# Patient Record
Sex: Female | Born: 1965 | Race: White | Hispanic: No | Marital: Married | Smoking: Current every day smoker
Health system: Southern US, Community
[De-identification: ages and names within clinical notes are randomized; demographics above are authoritative.]

## PROBLEM LIST (undated history)

## (undated) DIAGNOSIS — J40 Bronchitis, not specified as acute or chronic: Secondary | ICD-10-CM

## (undated) DIAGNOSIS — G4700 Insomnia, unspecified: Secondary | ICD-10-CM

## (undated) DIAGNOSIS — E785 Hyperlipidemia, unspecified: Secondary | ICD-10-CM

## (undated) DIAGNOSIS — N9489 Other specified conditions associated with female genital organs and menstrual cycle: Secondary | ICD-10-CM

## (undated) DIAGNOSIS — Z87891 Personal history of nicotine dependence: Secondary | ICD-10-CM

## (undated) DIAGNOSIS — K429 Umbilical hernia without obstruction or gangrene: Secondary | ICD-10-CM

## (undated) DIAGNOSIS — I2510 Atherosclerotic heart disease of native coronary artery without angina pectoris: Secondary | ICD-10-CM

## (undated) DIAGNOSIS — K219 Gastro-esophageal reflux disease without esophagitis: Secondary | ICD-10-CM

## (undated) DIAGNOSIS — C801 Malignant (primary) neoplasm, unspecified: Secondary | ICD-10-CM

## (undated) HISTORY — DX: Insomnia, unspecified: G47.00

## (undated) HISTORY — DX: Umbilical hernia without obstruction or gangrene: K42.9

## (undated) HISTORY — DX: Personal history of nicotine dependence: Z87.891

## (undated) HISTORY — PX: VAGINAL HYSTERECTOMY: SUR661

## (undated) HISTORY — PX: OOPHORECTOMY: SHX86

## (undated) HISTORY — DX: Hyperlipidemia, unspecified: E78.5

## (undated) HISTORY — PX: APPENDECTOMY: SHX54

## (undated) HISTORY — DX: Atherosclerotic heart disease of native coronary artery without angina pectoris: I25.10

## (undated) HISTORY — DX: Bronchitis, not specified as acute or chronic: J40

---

## 1997-04-21 ENCOUNTER — Emergency Department (HOSPITAL_COMMUNITY): Admission: EM | Admit: 1997-04-21 | Discharge: 1997-04-21 | Payer: Self-pay | Admitting: Emergency Medicine

## 1997-10-24 ENCOUNTER — Emergency Department (HOSPITAL_COMMUNITY): Admission: EM | Admit: 1997-10-24 | Discharge: 1997-10-24 | Payer: Self-pay | Admitting: Emergency Medicine

## 2000-01-16 ENCOUNTER — Encounter: Admission: RE | Admit: 2000-01-16 | Discharge: 2000-01-16 | Payer: Self-pay | Admitting: *Deleted

## 2000-01-16 ENCOUNTER — Encounter: Payer: Self-pay | Admitting: *Deleted

## 2000-05-27 ENCOUNTER — Ambulatory Visit (HOSPITAL_COMMUNITY): Admission: RE | Admit: 2000-05-27 | Discharge: 2000-05-27 | Payer: Self-pay | Admitting: Internal Medicine

## 2000-05-27 ENCOUNTER — Encounter: Payer: Self-pay | Admitting: Internal Medicine

## 2000-07-01 ENCOUNTER — Encounter: Payer: Self-pay | Admitting: Obstetrics and Gynecology

## 2000-07-01 ENCOUNTER — Encounter: Admission: RE | Admit: 2000-07-01 | Discharge: 2000-07-01 | Payer: Self-pay | Admitting: *Deleted

## 2000-07-02 ENCOUNTER — Encounter: Payer: Self-pay | Admitting: Obstetrics and Gynecology

## 2000-07-02 ENCOUNTER — Ambulatory Visit (HOSPITAL_COMMUNITY): Admission: RE | Admit: 2000-07-02 | Discharge: 2000-07-02 | Payer: Self-pay | Admitting: Obstetrics and Gynecology

## 2000-07-25 ENCOUNTER — Ambulatory Visit (HOSPITAL_COMMUNITY): Admission: RE | Admit: 2000-07-25 | Discharge: 2000-07-25 | Payer: Self-pay | Admitting: Obstetrics and Gynecology

## 2000-07-25 ENCOUNTER — Encounter: Payer: Self-pay | Admitting: Obstetrics and Gynecology

## 2000-08-29 ENCOUNTER — Ambulatory Visit (HOSPITAL_COMMUNITY): Admission: RE | Admit: 2000-08-29 | Discharge: 2000-08-29 | Payer: Self-pay | Admitting: Obstetrics and Gynecology

## 2000-08-29 ENCOUNTER — Encounter: Payer: Self-pay | Admitting: Obstetrics and Gynecology

## 2000-09-05 ENCOUNTER — Encounter (INDEPENDENT_AMBULATORY_CARE_PROVIDER_SITE_OTHER): Payer: Self-pay | Admitting: Specialist

## 2000-09-05 ENCOUNTER — Observation Stay (HOSPITAL_COMMUNITY): Admission: RE | Admit: 2000-09-05 | Discharge: 2000-09-06 | Payer: Self-pay | Admitting: Obstetrics and Gynecology

## 2003-08-03 ENCOUNTER — Ambulatory Visit (HOSPITAL_COMMUNITY): Admission: RE | Admit: 2003-08-03 | Discharge: 2003-08-03 | Payer: Self-pay | Admitting: Internal Medicine

## 2004-08-02 ENCOUNTER — Ambulatory Visit (HOSPITAL_COMMUNITY): Admission: RE | Admit: 2004-08-02 | Discharge: 2004-08-02 | Payer: Self-pay | Admitting: Internal Medicine

## 2004-10-02 ENCOUNTER — Encounter: Admission: RE | Admit: 2004-10-02 | Discharge: 2004-10-02 | Payer: Self-pay | Admitting: Internal Medicine

## 2005-02-23 ENCOUNTER — Emergency Department (HOSPITAL_COMMUNITY): Admission: EM | Admit: 2005-02-23 | Discharge: 2005-02-23 | Payer: Self-pay | Admitting: Emergency Medicine

## 2005-06-18 ENCOUNTER — Ambulatory Visit (HOSPITAL_COMMUNITY): Admission: RE | Admit: 2005-06-18 | Discharge: 2005-06-18 | Payer: Self-pay | Admitting: Family Medicine

## 2006-07-01 ENCOUNTER — Emergency Department (HOSPITAL_COMMUNITY): Admission: EM | Admit: 2006-07-01 | Discharge: 2006-07-01 | Payer: Self-pay | Admitting: Emergency Medicine

## 2007-06-02 ENCOUNTER — Ambulatory Visit: Payer: Self-pay | Admitting: Family Medicine

## 2007-06-03 ENCOUNTER — Encounter: Admission: RE | Admit: 2007-06-03 | Discharge: 2007-06-03 | Payer: Self-pay | Admitting: Family Medicine

## 2008-04-25 ENCOUNTER — Ambulatory Visit: Payer: Self-pay | Admitting: Family Medicine

## 2008-04-28 ENCOUNTER — Ambulatory Visit: Payer: Self-pay | Admitting: Family Medicine

## 2010-01-28 ENCOUNTER — Encounter: Payer: Self-pay | Admitting: Family Medicine

## 2010-03-28 ENCOUNTER — Emergency Department (HOSPITAL_BASED_OUTPATIENT_CLINIC_OR_DEPARTMENT_OTHER)
Admission: EM | Admit: 2010-03-28 | Discharge: 2010-03-28 | Disposition: A | Discharge status: Home / Self Care | Payer: BC Managed Care – PPO | Attending: Emergency Medicine | Admitting: Emergency Medicine

## 2010-03-28 ENCOUNTER — Emergency Department (INDEPENDENT_AMBULATORY_CARE_PROVIDER_SITE_OTHER): Payer: BC Managed Care – PPO

## 2010-03-28 DIAGNOSIS — R42 Dizziness and giddiness: Secondary | ICD-10-CM

## 2010-03-28 DIAGNOSIS — R51 Headache: Secondary | ICD-10-CM

## 2010-03-28 DIAGNOSIS — F17200 Nicotine dependence, unspecified, uncomplicated: Secondary | ICD-10-CM | POA: Insufficient documentation

## 2010-04-24 ENCOUNTER — Other Ambulatory Visit (HOSPITAL_BASED_OUTPATIENT_CLINIC_OR_DEPARTMENT_OTHER): Payer: Self-pay | Admitting: *Deleted

## 2010-04-24 DIAGNOSIS — R51 Headache: Secondary | ICD-10-CM

## 2010-04-25 ENCOUNTER — Ambulatory Visit (HOSPITAL_BASED_OUTPATIENT_CLINIC_OR_DEPARTMENT_OTHER)
Admission: RE | Admit: 2010-04-25 | Discharge: 2010-04-25 | Disposition: A | Discharge status: Home / Self Care | Payer: BC Managed Care – PPO | Source: Ambulatory Visit | Attending: Radiology | Admitting: Radiology

## 2010-04-25 DIAGNOSIS — R42 Dizziness and giddiness: Secondary | ICD-10-CM | POA: Insufficient documentation

## 2010-04-25 DIAGNOSIS — R51 Headache: Secondary | ICD-10-CM | POA: Insufficient documentation

## 2010-04-25 MED ORDER — GADOBENATE DIMEGLUMINE 529 MG/ML IV SOLN
15.0000 mL | Freq: Once | INTRAVENOUS | Status: AC | PRN
  Administered 2010-04-25: 15 mL via INTRAVENOUS
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
## 2010-05-25 NOTE — Op Note (Signed)
[facility]  Patient:    Mary Dominguez, Mary Dominguez Visit Number: [id number] MRN: [id number]          Service Type: GYN Location: [id number] Attending Physician:  Madelyn Flavors Dictated by:   Beather Arbour Thomasena Edis, M.D. Proc. Date: 09/05/00 Admit Date:  09/05/2000   CC:         Georgina Peer, M.D.   Operative Report  PREOPERATIVE DIAGNOSES:       Right ovarian mass, right lower quadrant pain, history of ovarian cancer in the patients grandmother.  POSTOPERATIVE DIAGNOSES:      Adhesions of omentum to right abdominal wall, simple left ovarian cyst, left ovary with adhesions to the pelvic side wall, right ovarian endometrioma.  ANESTHESIA:                   General endotracheal.  FLUIDS:                       Crystalloid 2000 cc plus Ancef preoperative antibiotic.  DRAINS:                       Foley.  COMPLICATIONS:                None.  ESTIMATED BLOOD LOSS:         Minimal.  DESCRIPTION OF PROCEDURE:     Patient was brought to the operating room, identified on the operating room table.  After induction of adequate general endotracheal anesthesia the patient was placed in a supine position and prepped and draped in the usual sterile fashion.  A Foley catheter was placed. A small Pfannenstiel incision was made and carried down to the fascia.  Fascia was scored in the midline, extended bilaterally using Mayo scissors.  It was then separated free from the underlying muscle.  Muscles were separated in the midline down to the symphysis.  With the patient in Trendelenburg the peritoneum was entered sharply and carefully, taking care to avoid bowel or other abdominal contents.  The peritoneal incision was extended superiorly, then inferiorly down to the bladder edge.  Washings were taken.  Exploration of the abdomen revealed the liver edge to be smooth.  There were no enlarged pelvic or periaortic nodes and peritoneal surfaces were noted to be  smooth. There were noted to be omental adhesions of the right abdominal wall and to the bladder flap.  These were very gently and carefully lysed.  The adhesions were such that the bowel was kinked in several places.  With restoration of a normal anatomy, bowel loops were noted to fall into their normal anatomic position.  Attention was then turned to the right ovary.  The right ovary was noted to be freely mobile and also noted to have an endometrioma encompassing much of the ovary and very near the hilum of the ovary.  The left ovary was noted to be adhesed to the left pelvic side wall but these adhesions were not particularly dense and I was able to lyse them and restore normal anatomy. The patient had made the decision to retain her left ovary if possible and it is my view that the ovary could again become adhesed but great care was taken in lysis of adhesions to decrease this risk.  Attention was then turned to the left tube and ovary.  The infundibulopelvic ligament was identified, picked up with a Babcock, and the peritoneum distal to the infundibulopelvic ligament  was incised with the Bovie.  The infundibulopelvic ligament was doubly clamped with right angles cut, tied with a free tie then with a suture ligature of 0 Monocryl.  Excellent hemostasis was noted at the infundibulopelvic pedicle. There were some adhesions at the attachments to the ovary near the round ligament and these were lysed as well.  The Heaney clamp was placed across the ovary and its attachments to the pelvic side wall cut and sent to pathology for examination.  This pedicle was tied with a free tie, then a suture ligature of 0 Monocryl.  Excellent hemostasis was noted.  There was noted to be a small amount of oozing at an area of adhesiolysis at the left ovary and excellent hemostasis was achieved using cautery.  The pelvis was copiously irrigated with warm lactated Ringers and all pedicles were noted to  be extremely hemostatic and all adhesiolysis sites were noted to be hemostatic as well.  Blood loss was minimal.  Patient was taken out of Trendelenburg. Irrigation fluid was suctioned.  Kelly clamps were placed on the perineum after removal of the [employer] retractor and bladder blade retractor.  The pack holding the bowel out of the operative field was removed as well.  The peritoneum was closed in a simple running fashion with 2-0 Monocryl.  The subfascial areas were examined for evidence of hemostasis and excellent hemostasis was achieved using cautery.  The fascia was closed using two sutures of 0 PDS with each suture ______ angle of the incision running to the midline and tied.  With running of the right most PDS suture and tying this it was noted to break but an additional PDS suture was placed to which the PDS suture closing the right portion of the incision was tied.  The subcutaneous tissue was irrigated copiously with warm lactated Ringers and hemostasis achieved using cautery.  The skin was closed with staples and Steri-Strips were applied.  It should be noted that excellent fascial closure was assured.  The patient tolerated the procedure well without apparent complications, was transferred to the recovery room in stable condition after all instrument, sponge, needle counts were correct. Dictated by:   Beather Arbour Thomasena Edis, M.D. Attending Physician:  Madelyn Flavors DD:  09/05/00 TD:  09/05/00 Job: [id number]

## 2012-04-18 ENCOUNTER — Encounter: Payer: Self-pay | Admitting: Family Medicine

## 2012-04-27 ENCOUNTER — Encounter: Payer: Self-pay | Admitting: Family Medicine

## 2012-04-27 ENCOUNTER — Ambulatory Visit (INDEPENDENT_AMBULATORY_CARE_PROVIDER_SITE_OTHER): Payer: BC Managed Care – PPO | Admitting: Family Medicine

## 2012-04-27 VITALS — BP 120/84 | HR 80 | Temp 98.3°F | Resp 14 | Wt 158.0 lb

## 2012-04-27 DIAGNOSIS — Z1239 Encounter for other screening for malignant neoplasm of breast: Secondary | ICD-10-CM

## 2012-04-27 DIAGNOSIS — N951 Menopausal and female climacteric states: Secondary | ICD-10-CM

## 2012-04-27 DIAGNOSIS — Z0000 Encounter for general adult medical examination without abnormal findings: Secondary | ICD-10-CM

## 2012-04-27 DIAGNOSIS — Z87891 Personal history of nicotine dependence: Secondary | ICD-10-CM | POA: Insufficient documentation

## 2012-04-27 MED ORDER — ALPRAZOLAM 0.5 MG PO TABS: 0.5000 mg | ORAL_TABLET | Freq: Every evening | ORAL | Status: DC | PRN

## 2012-04-27 MED ORDER — ESTROGENS CONJUGATED 0.3 MG PO TABS: ORAL_TABLET | ORAL | Status: DC
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
## 2012-04-29 ENCOUNTER — Telehealth: Payer: Self-pay | Admitting: Family Medicine

## 2012-04-30 NOTE — Telephone Encounter (Signed)
Pt aware.

## 2012-04-30 NOTE — Telephone Encounter (Signed)
Try doubling it first and give 2 weeks.

## 2012-05-08 ENCOUNTER — Telehealth: Payer: Self-pay | Admitting: Family Medicine

## 2012-05-08 MED ORDER — ESTROGENS CONJUGATED 0.625 MG PO TABS: 0.6250 mg | ORAL_TABLET | Freq: Every day | ORAL | Status: DC

## 2012-05-08 NOTE — Telephone Encounter (Signed)
Rx Refilled  

## 2012-05-08 NOTE — Telephone Encounter (Signed)
They do not make Premarin in .6 mg it is .625mg . Do you want me to do 2 - .3 mg or rx for .625mg ?

## 2012-05-08 NOTE — Telephone Encounter (Signed)
Call out 0.625 mg poqday #30, RF 5

## 2012-05-11 ENCOUNTER — Ambulatory Visit: Payer: BC Managed Care – PPO

## 2012-05-25 ENCOUNTER — Ambulatory Visit: Payer: BC Managed Care – PPO

## 2012-05-27 ENCOUNTER — Other Ambulatory Visit: Payer: Self-pay | Admitting: Family Medicine

## 2012-05-27 NOTE — Telephone Encounter (Signed)
Ok to refill 

## 2012-05-27 NOTE — Telephone Encounter (Signed)
Med rf °

## 2012-05-27 NOTE — Telephone Encounter (Signed)
Ok to fill 

## 2012-06-02 ENCOUNTER — Ambulatory Visit
Admission: RE | Admit: 2012-06-02 | Discharge: 2012-06-02 | Disposition: A | Discharge status: Home / Self Care | Payer: BC Managed Care – PPO | Source: Ambulatory Visit | Attending: Family Medicine | Admitting: Family Medicine

## 2012-06-02 DIAGNOSIS — Z1239 Encounter for other screening for malignant neoplasm of breast: Secondary | ICD-10-CM

## 2012-06-29 ENCOUNTER — Other Ambulatory Visit: Payer: Self-pay | Admitting: Family Medicine

## 2012-06-29 MED ORDER — ALPRAZOLAM 0.5 MG PO TABS: ORAL_TABLET | ORAL | Status: DC

## 2012-06-29 NOTE — Telephone Encounter (Signed)
Ok to refill 

## 2012-06-29 NOTE — Telephone Encounter (Signed)
?   OK to Refill  

## 2012-06-29 NOTE — Telephone Encounter (Signed)
Rx Refilled  

## 2012-07-30 ENCOUNTER — Other Ambulatory Visit: Payer: Self-pay | Admitting: Family Medicine

## 2012-07-30 NOTE — Telephone Encounter (Signed)
Ok to refill 

## 2012-07-30 NOTE — Telephone Encounter (Signed)
?   OK to Refill  

## 2012-09-01 ENCOUNTER — Other Ambulatory Visit: Payer: Self-pay | Admitting: Family Medicine

## 2012-09-02 NOTE — Telephone Encounter (Signed)
?   Ok to refill,last refill 07/30/12

## 2012-09-03 ENCOUNTER — Other Ambulatory Visit: Payer: Self-pay | Admitting: Family Medicine

## 2012-09-03 NOTE — Telephone Encounter (Signed)
ok 

## 2012-09-03 NOTE — Telephone Encounter (Signed)
Rx called in 

## 2012-09-04 NOTE — Telephone Encounter (Signed)
Ok

## 2012-09-04 NOTE — Telephone Encounter (Signed)
OK refill?? 

## 2012-11-03 ENCOUNTER — Other Ambulatory Visit: Payer: Self-pay | Admitting: Family Medicine

## 2012-11-03 NOTE — Telephone Encounter (Signed)
?   Ok to refill, last ov 04/27/12, last refill 09/03/12

## 2012-11-03 NOTE — Telephone Encounter (Signed)
rx called in

## 2012-11-03 NOTE — Telephone Encounter (Signed)
ok 

## 2012-12-07 ENCOUNTER — Other Ambulatory Visit: Payer: Self-pay | Admitting: Family Medicine

## 2012-12-07 NOTE — Telephone Encounter (Signed)
?   OK to Refill  

## 2012-12-07 NOTE — Telephone Encounter (Signed)
ok 

## 2012-12-07 NOTE — Telephone Encounter (Signed)
RX called in .

## 2013-01-07 ENCOUNTER — Other Ambulatory Visit: Payer: Self-pay | Admitting: Family Medicine

## 2013-01-08 NOTE — Telephone Encounter (Signed)
ok 

## 2013-01-08 NOTE — Telephone Encounter (Signed)
?   OK to Refill  

## 2013-02-11 ENCOUNTER — Other Ambulatory Visit: Payer: Self-pay | Admitting: Family Medicine

## 2013-02-11 NOTE — Telephone Encounter (Signed)
ok 

## 2013-02-11 NOTE — Telephone Encounter (Signed)
?   OK to Refill  

## 2013-03-12 ENCOUNTER — Other Ambulatory Visit: Payer: Self-pay | Admitting: Family Medicine

## 2013-03-15 NOTE — Telephone Encounter (Signed)
?   OK to Refill  

## 2013-03-15 NOTE — Telephone Encounter (Signed)
ok 

## 2013-04-27 ENCOUNTER — Other Ambulatory Visit: Payer: Self-pay | Admitting: Family Medicine

## 2013-04-27 ENCOUNTER — Other Ambulatory Visit: Payer: BC Managed Care – PPO

## 2013-04-27 DIAGNOSIS — Z0000 Encounter for general adult medical examination without abnormal findings: Secondary | ICD-10-CM

## 2013-04-27 LAB — CBC WITH DIFFERENTIAL/PLATELET
Basophils Absolute: 0 10*3/uL (ref 0.0–0.1)
Basophils Relative: 0 % (ref 0–1)
Eosinophils Absolute: 0.2 10*3/uL (ref 0.0–0.7)
Eosinophils Relative: 2 % (ref 0–5)
HCT: 43.5 % (ref 36.0–46.0)
Hemoglobin: 15.2 g/dL — ABNORMAL HIGH (ref 12.0–15.0)
Lymphocytes Relative: 23 % (ref 12–46)
Lymphs Abs: 1.8 10*3/uL (ref 0.7–4.0)
MCH: 31.9 pg (ref 26.0–34.0)
MCHC: 34.9 g/dL (ref 30.0–36.0)
MCV: 91.2 fL (ref 78.0–100.0)
Monocytes Absolute: 0.6 10*3/uL (ref 0.1–1.0)
Monocytes Relative: 7 % (ref 3–12)
Neutro Abs: 5.4 10*3/uL (ref 1.7–7.7)
Neutrophils Relative %: 68 % (ref 43–77)
Platelets: 236 10*3/uL (ref 150–400)
RBC: 4.77 MIL/uL (ref 3.87–5.11)
RDW: 13 % (ref 11.5–15.5)
WBC: 7.9 10*3/uL (ref 4.0–10.5)

## 2013-04-27 LAB — HEPATIC FUNCTION PANEL
ALT: 19 U/L (ref 0–35)
AST: 19 U/L (ref 0–37)
Albumin: 4.4 g/dL (ref 3.5–5.2)
Alkaline Phosphatase: 60 U/L (ref 39–117)
Bilirubin, Direct: 0.1 mg/dL (ref 0.0–0.3)
Indirect Bilirubin: 0.3 mg/dL (ref 0.2–1.2)
Total Bilirubin: 0.4 mg/dL (ref 0.2–1.2)
Total Protein: 7 g/dL (ref 6.0–8.3)

## 2013-04-27 LAB — BASIC METABOLIC PANEL
BUN: 9 mg/dL (ref 6–23)
CO2: 28 mEq/L (ref 19–32)
Calcium: 9.3 mg/dL (ref 8.4–10.5)
Chloride: 101 mEq/L (ref 96–112)
Creat: 0.78 mg/dL (ref 0.50–1.10)
Glucose, Bld: 96 mg/dL (ref 70–99)
Potassium: 4.7 mEq/L (ref 3.5–5.3)
Sodium: 138 mEq/L (ref 135–145)

## 2013-04-27 LAB — LIPID PANEL
Cholesterol: 220 mg/dL — ABNORMAL HIGH (ref 0–200)
HDL: 53 mg/dL (ref >39)
LDL Cholesterol: 134 mg/dL — ABNORMAL HIGH (ref 0–99)
Total CHOL/HDL Ratio: 4.2 Ratio
Triglycerides: 163 mg/dL — ABNORMAL HIGH (ref <150)
VLDL: 33 mg/dL (ref 0–40)

## 2013-04-27 LAB — TSH: TSH: 0.972 u[IU]/mL (ref 0.350–4.500)

## 2013-04-29 ENCOUNTER — Encounter: Payer: Self-pay | Admitting: Family Medicine

## 2013-04-29 ENCOUNTER — Ambulatory Visit (INDEPENDENT_AMBULATORY_CARE_PROVIDER_SITE_OTHER): Payer: BC Managed Care – PPO | Admitting: Family Medicine

## 2013-04-29 VITALS — BP 128/82 | HR 80 | Temp 98.5°F | Resp 14 | Ht 67.5 in | Wt 157.0 lb

## 2013-04-29 DIAGNOSIS — G4700 Insomnia, unspecified: Secondary | ICD-10-CM | POA: Insufficient documentation

## 2013-04-29 DIAGNOSIS — Z0000 Encounter for general adult medical examination without abnormal findings: Secondary | ICD-10-CM

## 2013-04-29 MED ORDER — ALPRAZOLAM 0.5 MG PO TABS: ORAL_TABLET | ORAL | Status: DC

## 2013-04-29 MED ORDER — ESTROGENS CONJUGATED 0.625 MG PO TABS: 0.6250 mg | ORAL_TABLET | Freq: Every day | ORAL | Status: DC

## 2013-04-29 NOTE — Progress Notes (Signed)
Subjective:    Patient ID: Mary Dominguez, female    DOB: [birth year], 48 y.o.   MRN: [id number]  HPI Patient is a 48 year old white female here today for complete physical exam. She recently had a friend die from metastatic ovarian cancer. She wants a CA 125. She understands that insurance would not pay for it. I counseled her against having this done as a screening test according to the guidelines set forth by USPSTF.  She understands this and still wants to test done. Her mammogram is not due until May. She has a history of a hysterectomy. She only has her right ovary remaining. Therefore she does not need Pap smears. She is no family history of colon cancer and she denies any gastrointestinal symptoms that would warrant premature colonoscopies. Her most recent labwork as listed below: Appointment on 04/27/2013  Component Date Value Ref Range Status  . Sodium 04/27/2013 138  135 - 145 mEq/L Final  . Potassium 04/27/2013 4.7  3.5 - 5.3 mEq/L Final  . Chloride 04/27/2013 101  96 - 112 mEq/L Final  . CO2 04/27/2013 28  19 - 32 mEq/L Final  . Glucose, Bld 04/27/2013 96  70 - 99 mg/dL Final  . BUN 04/27/2013 9  6 - 23 mg/dL Final  . Creat 04/27/2013 0.78  0.50 - 1.10 mg/dL Final  . Calcium 04/27/2013 9.3  8.4 - 10.5 mg/dL Final  . WBC 04/27/2013 7.9  4.0 - 10.5 K/uL Final  . RBC 04/27/2013 4.77  3.87 - 5.11 MIL/uL Final  . Hemoglobin 04/27/2013 15.2* 12.0 - 15.0 g/dL Final  . HCT 04/27/2013 43.5  36.0 - 46.0 % Final  . MCV 04/27/2013 91.2  78.0 - 100.0 fL Final  . MCH 04/27/2013 31.9  26.0 - 34.0 pg Final  . MCHC 04/27/2013 34.9  30.0 - 36.0 g/dL Final  . RDW 04/27/2013 13.0  11.5 - 15.5 % Final  . Platelets 04/27/2013 236  150 - 400 K/uL Final  . Neutrophils Relative % 04/27/2013 68  43 - 77 % Final  . Neutro Abs 04/27/2013 5.4  1.7 - 7.7 K/uL Final  . Lymphocytes Relative 04/27/2013 23  12 - 46 % Final  . Lymphs Abs 04/27/2013 1.8  0.7 - 4.0 K/uL Final  . Monocytes Relative 04/27/2013  7  3 - 12 % Final  . Monocytes Absolute 04/27/2013 0.6  0.1 - 1.0 K/uL Final  . Eosinophils Relative 04/27/2013 2  0 - 5 % Final  . Eosinophils Absolute 04/27/2013 0.2  0.0 - 0.7 K/uL Final  . Basophils Relative 04/27/2013 0  0 - 1 % Final  . Basophils Absolute 04/27/2013 0.0  0.0 - 0.1 K/uL Final  . Smear Review 04/27/2013 Criteria for review not met   Final  . Total Bilirubin 04/27/2013 0.4  0.2 - 1.2 mg/dL Final  . Bilirubin, Direct 04/27/2013 0.1  0.0 - 0.3 mg/dL Final  . Indirect Bilirubin 04/27/2013 0.3  0.2 - 1.2 mg/dL Final  . Alkaline Phosphatase 04/27/2013 60  39 - 117 U/L Final  . AST 04/27/2013 19  0 - 37 U/L Final  . ALT 04/27/2013 19  0 - 35 U/L Final  . Total Protein 04/27/2013 7.0  6.0 - 8.3 g/dL Final  . Albumin 04/27/2013 4.4  3.5 - 5.2 g/dL Final  . TSH 04/27/2013 0.972  0.350 - 4.500 uIU/mL Final  . Cholesterol 04/27/2013 220* 0 - 200 mg/dL Final   Comment: ATP III Classification:                                <  200        mg/dL        Desirable                               200 - 239     mg/dL        Borderline High                               >= 240        mg/dL        High                             . Triglycerides 04/27/2013 163* <150 mg/dL Final  . HDL 04/27/2013 53  >39 mg/dL Final  . Total CHOL/HDL Ratio 04/27/2013 4.2   Final  . VLDL 04/27/2013 33  0 - 40 mg/dL Final  . LDL Cholesterol 04/27/2013 134* 0 - 99 mg/dL Final   Comment:                            Total Cholesterol/HDL Ratio:CHD Risk                                                 Coronary Heart Disease Risk Table                                                                 Men       Women                                   1/2 Average Risk              3.4        3.3                                       Average Risk              5.0        4.4                                    2X Average Risk              9.6        7.1                                    3X Average Risk             23.4        11.0  Use the calculated Patient Ratio above and the CHD Risk table                           to determine the patient's CHD Risk.                          ATP III Classification (LDL):                                < 100        mg/dL         Optimal                               100 - 129     mg/dL         Near or Above Optimal                               130 - 159     mg/dL         Borderline High                               160 - 189     mg/dL         High                                > 190        mg/dL         Very High                              Past Medical History  Diagnosis Date  . Former smoker   . Insomnia    Past Surgical History  Procedure Laterality Date  . Appendectomy    . Oophorectomy      One in 2000  . Abdominal hysterectomy      menorrhagia   Current Outpatient Prescriptions on File Prior to Visit  Medication Sig Dispense Refill  . Ibuprofen-Diphenhydramine Cit (ADVIL PM PO) Take by mouth at bedtime.       No current facility-administered medications on file prior to visit.   Allergies  Allergen Reactions  . Penicillins    History   Social History  . Marital Status: Married    Spouse Name: N/A    Number of Children: N/A  . Years of Education: N/A   Occupational History  . Not on file.   Social History Main Topics  . Smoking status: Former Smoker    Types: Cigarettes    Quit date: 02/09/2012  . Smokeless tobacco: Not on file  . Alcohol Use: Yes     Comment: twice a week  . Drug Use: No  . Sexual Activity: Yes   Other Topics Concern  . Not on file   Social History Narrative  . No narrative on file   Family History  Problem Relation Age of Onset  . Cancer Neg Hx     no breast, ovarian, or colon cancer      Review of Systems  All other systems  reviewed and are negative.      Objective:   Physical Exam  Vitals reviewed. Constitutional: She is oriented to person, place, and time. She  appears well-developed and well-nourished. No distress.  HENT:  Head: Normocephalic and atraumatic.  Right Ear: External ear normal.  Left Ear: External ear normal.  Nose: Nose normal.  Mouth/Throat: Oropharynx is clear and moist. No oropharyngeal exudate.  Eyes: Conjunctivae and EOM are normal. Pupils are equal, round, and reactive to light. Right eye exhibits no discharge. Left eye exhibits no discharge. No scleral icterus.  Neck: Normal range of motion. Neck supple. No JVD present. No tracheal deviation present. No thyromegaly present.  Cardiovascular: Normal rate, regular rhythm, normal heart sounds and intact distal pulses.  Exam reveals no gallop and no friction rub.   No murmur heard. Pulmonary/Chest: Effort normal and breath sounds normal. No stridor. No respiratory distress. She has no wheezes. She has no rales. She exhibits no tenderness.  Abdominal: Soft. Bowel sounds are normal. She exhibits no distension and no mass. There is no tenderness. There is no rebound and no guarding.  Musculoskeletal: Normal range of motion. She exhibits no edema and no tenderness.  Lymphadenopathy:    She has no cervical adenopathy.  Neurological: She is alert and oriented to person, place, and time. She has normal reflexes. She displays normal reflexes. No cranial nerve deficit. She exhibits normal muscle tone. Coordination normal.  Skin: Skin is warm. No rash noted. She is not diaphoretic. No erythema. No pallor.  Psychiatric: She has a normal mood and affect. Her behavior is normal. Judgment and thought content normal.          Assessment & Plan:  1. Routine general medical examination at a health care facility Agreed to the patient's wishes, and I ordered a CA 125. The patient is willing to pay cash.  I reviewed the remainder of her lab work in detail. Her cholesterol is elevated given her age and smoking, especially given her use of estrogens for hot flashes.  I recommended smoking cessation  and decreased consumption of fat, saturated fat, and meat in her diet. I recommended increased exercise and weight loss. We discussed the recommendations for screening in accordance to the USPSTF guidelines and she still wishes to proceed.  I also refilled the Xanax the patient uses sparingly to help her sleep. I did take the opportunity to explain to her that her smoking is much more dangerous to her long-term health than the risk of ovarian cancer.  I strongly counseled smoking cessation. - CA 125

## 2013-04-30 ENCOUNTER — Other Ambulatory Visit: Payer: Self-pay

## 2013-04-30 DIAGNOSIS — Z1231 Encounter for screening mammogram for malignant neoplasm of breast: Secondary | ICD-10-CM

## 2013-04-30 LAB — CA 125: CA 125: 3.8 U/mL (ref 0.0–30.2)

## 2013-05-28 ENCOUNTER — Ambulatory Visit: Payer: BC Managed Care – PPO | Admitting: Family Medicine

## 2013-06-10 ENCOUNTER — Ambulatory Visit
Admission: RE | Admit: 2013-06-10 | Discharge: 2013-06-10 | Disposition: A | Discharge status: Home / Self Care | Payer: BC Managed Care – PPO | Source: Ambulatory Visit

## 2013-06-10 ENCOUNTER — Encounter (INDEPENDENT_AMBULATORY_CARE_PROVIDER_SITE_OTHER): Payer: Self-pay

## 2013-06-10 DIAGNOSIS — Z1231 Encounter for screening mammogram for malignant neoplasm of breast: Secondary | ICD-10-CM

## 2013-06-11 ENCOUNTER — Ambulatory Visit: Payer: BC Managed Care – PPO

## 2013-09-01 ENCOUNTER — Emergency Department (HOSPITAL_BASED_OUTPATIENT_CLINIC_OR_DEPARTMENT_OTHER): Payer: BC Managed Care – PPO

## 2013-09-01 ENCOUNTER — Encounter (HOSPITAL_BASED_OUTPATIENT_CLINIC_OR_DEPARTMENT_OTHER): Payer: Self-pay | Admitting: Emergency Medicine

## 2013-09-01 ENCOUNTER — Inpatient Hospital Stay (HOSPITAL_BASED_OUTPATIENT_CLINIC_OR_DEPARTMENT_OTHER)
Admission: EM | Admit: 2013-09-01 | Discharge: 2013-09-02 | DRG: 313 | Disposition: A | Discharge status: Home / Self Care | Payer: BC Managed Care – PPO | Attending: Internal Medicine | Admitting: Internal Medicine

## 2013-09-01 DIAGNOSIS — F17200 Nicotine dependence, unspecified, uncomplicated: Secondary | ICD-10-CM | POA: Diagnosis present

## 2013-09-01 DIAGNOSIS — R0789 Other chest pain: Principal | ICD-10-CM | POA: Insufficient documentation

## 2013-09-01 DIAGNOSIS — R079 Chest pain, unspecified: Secondary | ICD-10-CM | POA: Diagnosis present

## 2013-09-01 DIAGNOSIS — E785 Hyperlipidemia, unspecified: Secondary | ICD-10-CM | POA: Diagnosis present

## 2013-09-01 DIAGNOSIS — F411 Generalized anxiety disorder: Secondary | ICD-10-CM | POA: Diagnosis present

## 2013-09-01 LAB — CBC
HCT: 46.5 % — ABNORMAL HIGH (ref 36.0–46.0)
Hemoglobin: 15.9 g/dL — ABNORMAL HIGH (ref 12.0–15.0)
MCH: 32.1 pg (ref 26.0–34.0)
MCHC: 34.2 g/dL (ref 30.0–36.0)
MCV: 93.9 fL (ref 78.0–100.0)
Platelets: 239 10*3/uL (ref 150–400)
RBC: 4.95 MIL/uL (ref 3.87–5.11)
RDW: 12.3 % (ref 11.5–15.5)
WBC: 10.5 10*3/uL (ref 4.0–10.5)

## 2013-09-01 LAB — HEPATIC FUNCTION PANEL
ALT: 19 U/L (ref 0–35)
AST: 20 U/L (ref 0–37)
Albumin: 3.7 g/dL (ref 3.5–5.2)
Alkaline Phosphatase: 69 U/L (ref 39–117)
Bilirubin, Direct: <0.2 mg/dL (ref 0.0–0.3)
Total Bilirubin: <0.2 mg/dL — ABNORMAL LOW (ref 0.3–1.2)
Total Protein: 6.8 g/dL (ref 6.0–8.3)

## 2013-09-01 LAB — BASIC METABOLIC PANEL
Anion gap: 13 (ref 5–15)
BUN: 9 mg/dL (ref 6–23)
CO2: 28 mEq/L (ref 19–32)
Calcium: 10.1 mg/dL (ref 8.4–10.5)
Chloride: 100 mEq/L (ref 96–112)
Creatinine, Ser: 0.8 mg/dL (ref 0.50–1.10)
GFR calc Af Amer: >90 mL/min (ref >90)
GFR calc non Af Amer: 85 mL/min — ABNORMAL LOW (ref >90)
Glucose, Bld: 99 mg/dL (ref 70–99)
Potassium: 4 mEq/L (ref 3.7–5.3)
Sodium: 141 mEq/L (ref 137–147)

## 2013-09-01 LAB — D-DIMER, QUANTITATIVE: D-Dimer, Quant: 0.35 ug/mL-FEU (ref 0.00–0.48)

## 2013-09-01 LAB — TROPONIN I
Troponin I: <0.3 ng/mL (ref <0.30)
Troponin I: <0.3 ng/mL (ref <0.30)

## 2013-09-01 MED ORDER — NITROGLYCERIN 0.4 MG SL SUBL: 0.4000 mg | SUBLINGUAL_TABLET | SUBLINGUAL | Status: DC | PRN

## 2013-09-01 MED ORDER — MORPHINE SULFATE 4 MG/ML IJ SOLN: 4.0000 mg | INTRAMUSCULAR | Status: DC | PRN

## 2013-09-01 MED ORDER — ACETAMINOPHEN 650 MG RE SUPP: 650.0000 mg | Freq: Four times a day (QID) | RECTAL | Status: DC | PRN

## 2013-09-01 MED ORDER — ASPIRIN 325 MG PO TABS
325.0000 mg | ORAL_TABLET | Freq: Every day | ORAL | Status: DC
  Administered 2013-09-01 – 2013-09-02 (×2): 325 mg via ORAL
  Filled 2013-09-01 (×2): qty 1

## 2013-09-01 MED ORDER — GI COCKTAIL ~~LOC~~: 30.0000 mL | Freq: Two times a day (BID) | ORAL | Status: DC | PRN

## 2013-09-01 MED ORDER — ACETAMINOPHEN 325 MG PO TABS: 650.0000 mg | ORAL_TABLET | Freq: Four times a day (QID) | ORAL | Status: DC | PRN

## 2013-09-01 MED ORDER — ASPIRIN 325 MG PO TABS
325.0000 mg | ORAL_TABLET | Freq: Once | ORAL | Status: AC
  Administered 2013-09-01: 325 mg via ORAL
  Filled 2013-09-01: qty 1

## 2013-09-01 MED ORDER — NITROGLYCERIN 0.4 MG SL SUBL
0.4000 mg | SUBLINGUAL_TABLET | SUBLINGUAL | Status: DC | PRN
  Administered 2013-09-01 (×2): 0.4 mg via SUBLINGUAL
  Filled 2013-09-01 (×2): qty 1

## 2013-09-01 MED ORDER — ALPRAZOLAM 0.5 MG PO TABS
0.5000 mg | ORAL_TABLET | Freq: Two times a day (BID) | ORAL | Status: DC | PRN
  Administered 2013-09-01: 0.5 mg via ORAL
  Filled 2013-09-01: qty 1

## 2013-09-01 MED ORDER — ENOXAPARIN SODIUM 40 MG/0.4ML ~~LOC~~ SOLN
40.0000 mg | SUBCUTANEOUS | Status: DC
  Administered 2013-09-01: 40 mg via SUBCUTANEOUS
  Filled 2013-09-01 (×2): qty 0.4

## 2013-09-01 NOTE — Consult Note (Signed)
Admit date: 09/01/2013 Referring Physician  Dr. Broadus John Primary Physician  Dr. Dennard Schaumann Primary Cardiologist  NONE Reason for Consultation  Chest pain  HPI: EATHER CHAIRES is a 48 y.o. female with no PMH but significant tobacco use who presented to the ER complaints of CP. She was in her usual state of health until 2 weeks ago when she started having intermittent chest pain, which was sharp transient and self limiting. Today while she was in a meeting, she started experiencing severe sharp chest pain, that was also like tightness/pressure, radiating to jaw, teeth and back and lasted for around 88min and went to the ER. She was given some SL nitro and her chest pain improved. In addition she has also had intermittent dizziness for few days.  She has had menopausal symptoms and has been on hormone therapy which resolved her hot flashes but recently she has had hot flashes again at night.  She has also noticed severe fatigue but has bad insomnia.  She denies any exertional chest pain or DOE. Cardiology is now asked to consult.     PMH:   Past Medical History  Diagnosis Date  . Former smoker   . Insomnia      PSH:   Past Surgical History  Procedure Laterality Date  . Appendectomy    . Oophorectomy      One in 2000  . Abdominal hysterectomy      menorrhagia    Allergies:  Penicillins Prior to Admit Meds:   Prescriptions prior to admission  Medication Sig Dispense Refill  . ALPRAZolam (XANAX) 0.5 MG tablet Take 0.5 mg by mouth 2 (two) times daily as needed for anxiety.      Marland Kitchen estrogens, conjugated, (PREMARIN) 0.625 MG tablet Take 0.625 mg by mouth daily.      . [DISCONTINUED] estrogens, conjugated, (PREMARIN) 0.625 MG tablet Take 1 tablet (0.625 mg total) by mouth daily.  30 tablet  11   Fam HX:    Family History  Problem Relation Age of Onset  . Cancer Neg Hx     no breast, ovarian, or colon cancer   Social HX:    History   Social History  . Marital Status: Married    Spouse  Name: N/A    Number of Children: N/A  . Years of Education: N/A   Occupational History  . Not on file.   Social History Main Topics  . Smoking status: Current Every Day Smoker -- 0.75 packs/day    Types: Cigarettes    Last Attempt to Quit: 02/09/2012  . Smokeless tobacco: Not on file  . Alcohol Use: Yes     Comment: twice a week  . Drug Use: No  . Sexual Activity: Yes   Other Topics Concern  . Not on file   Social History Narrative  . No narrative on file     ROS:  All 11 ROS were addressed and are negative except what is stated in the HPI  Physical Exam: Blood pressure 132/78, pulse 77, temperature 98 F (36.7 C), temperature source Oral, resp. rate 16, height 5\' 8"  (1.727 m), weight 158 lb (71.668 kg), SpO2 100.00%.    General: Well developed, well nourished, in no acute distress Head: Eyes PERRLA, No xanthomas.   Normal cephalic and atramatic  Lungs:   Clear bilaterally to auscultation and percussion. Heart:   HRRR S1 S2 Pulses are 2+ & equal.            No carotid bruit. No  JVD.  No abdominal bruits. No femoral bruits. Abdomen: Bowel sounds are positive, abdomen soft and non-tender without masses  Extremities:   No clubbing, cyanosis or edema.  DP +1 Neuro: Alert and oriented X 3. Psych:  Good affect, responds appropriately    Labs:   Lab Results  Component Value Date   WBC 10.5 09/01/2013   HGB 15.9* 09/01/2013   HCT 46.5* 09/01/2013   MCV 93.9 09/01/2013   PLT 239 09/01/2013    Recent Labs Lab 09/01/13 1155  NA 141  K 4.0  CL 100  CO2 28  BUN 9  CREATININE 0.80  CALCIUM 10.1  GLUCOSE 99   No results found for this basename: PTT   No results found for this basename: INR, PROTIME   Lab Results  Component Value Date   TROPONINI <0.30 09/01/2013     Lab Results  Component Value Date   CHOL 220* 04/27/2013   Lab Results  Component Value Date   HDL 53 04/27/2013   Lab Results  Component Value Date   LDLCALC 134* 04/27/2013   Lab Results    Component Value Date   TRIG 163* 04/27/2013   Lab Results  Component Value Date   CHOLHDL 4.2 04/27/2013   No results found for this basename: LDLDIRECT      Radiology:  Dg Chest 2 View  09/01/2013   CLINICAL DATA:  Chest pain  EXAM: CHEST  2 VIEW  COMPARISON:  None.  FINDINGS: There is no edema or consolidation. The heart size and pulmonary vascularity are normal. No pneumothorax. No adenopathy. No bone lesions.  IMPRESSION: No edema or consolidation.   Electronically Signed   By: Lowella Grip M.D.   On: 09/01/2013 12:40    EKG:  NSR with rSR' V1 and nonspecific T wave abnormality  ASSESSMENT:  1.  Chest pain with typical and atypical components. Her CRF include tobacco use and post menopausal state.  EKG is nonischemic.  Initial cardiac enzymes are normal.  Her symptoms could be due to coronary ischemia but also could be due to GERD with esophageal spasm. 2.  Tobacco abuse ongoing  PLAN:   1.  Cycle cardiac enzymes and if negative then stress myoview in am 2.  NPO after MN 3.  2D echo to assess LVF  Sueanne Margarita, MD  09/01/2013  6:10 PM

## 2013-09-01 NOTE — ED Notes (Signed)
[facility] at bedside for transport. 

## 2013-09-01 NOTE — ED Provider Notes (Signed)
Medical screening examination/treatment/procedure(s) were conducted as a shared visit with non-physician practitioner(s) and myself.  I personally evaluated the patient during the encounter.  Pt presenting with substernal CP at rest w/ radiation to neck/jaw. EKG with NSSTT changes. First trop negative Heart score IV.  Pain resolved after ASA/NTG.  On my PE, VSS, pt in NAD. Cardiac, pulm and abdominal exam benign.    EKG Interpretation   Date/Time:  Wednesday September 01 2013 11:49:59 EDT Ventricular Rate:  78 PR Interval:  132 QRS Duration: 98 QT Interval:  364 QTC Calculation: 414 R Axis:   43 Text Interpretation:  Normal sinus rhythm Nonspecific ST and T wave  abnormality Abnormal ECG Confirmed by Clayton Jarmon  MD, Hollyn Stucky ([id number]) on  09/01/2013 11:51:33 AM        Ernestina Patches, MD 09/01/13 1545

## 2013-09-01 NOTE — ED Notes (Signed)
Pt. Reports to ED with chest pain beginning this AM. Denies N/V. Pt. Has SOB with speaking, dizziness and lightheadness for a couple weeks: increasing over the last couple of days.

## 2013-09-01 NOTE — ED Notes (Signed)
Attempted to call report to [facility] RN. Gave number for call back.

## 2013-09-01 NOTE — ED Notes (Signed)
[facility] here for transport.  

## 2013-09-01 NOTE — H&P (Signed)
[facility] History and Physical  Mary Dominguez LZJ:[id number] DOB: 06/06/1965 DOA: 09/01/2013  Referring physician: EDP PCP: Odette Fraction, MD   Chief Complaint: Chest pain  HPI: Mary Dominguez is a 48 y.o. female with no PMH, tobacco use presents to the ER with the above complaints. She was in her usual state of health till 2 weeks ago, started having intermittent chest pain, which was sharp transient and self limiting. Today while she was in a meeting, she started experiencing severe sharp chest pain, that was also like tightness/pressure, radiating to jaw and back, it lasted for around 22min and went to the ER. She was given some SL nitro and her chest pain improved. In addition she has also had intermittent dizziness for few days   Review of Systems: positives bolded Constitutional:  No weight loss, night sweats, Fevers, chills, fatigue.  HEENT:  No headaches, Difficulty swallowing,Tooth/dental problems,Sore throat,  No sneezing, itching, ear ache, nasal congestion, post nasal drip,  Cardio-vascular:   chest pain, Orthopnea, PND, swelling in lower extremities, anasarca, dizziness, palpitations  GI:  No heartburn, indigestion, abdominal pain, nausea, vomiting, diarrhea, change in bowel habits, loss of appetite  Resp:  No shortness of breath with exertion or at rest. No excess mucus, no productive cough, No non-productive cough, No coughing up of blood.No change in color of mucus.No wheezing.No chest wall deformity  Skin:  no rash or lesions.  GU:  no dysuria, change in color of urine, no urgency or frequency. No flank pain.  Musculoskeletal:  No joint pain or swelling. No decreased range of motion. No back pain.  Psych:  No change in mood or affect. No depression or anxiety. No memory loss.   Past Medical History  Diagnosis Date  . Former smoker   . Insomnia    Past Surgical History  Procedure Laterality Date  . Appendectomy    . Oophorectomy      One  in 2000  . Abdominal hysterectomy      menorrhagia   Social History:  reports that she has been smoking Cigarettes.  She has been smoking about 0.75 packs per day. She does not have any smokeless tobacco history on file. She reports that she drinks alcohol. She reports that she does not use illicit drugs.  Allergies  Allergen Reactions  . Penicillins     Family History  Problem Relation Age of Onset  . Cancer Neg Hx     no breast, ovarian, or colon cancer     Prior to Admission medications   Medication Sig Start Date End Date Taking? Authorizing Provider  ALPRAZolam Duanne Moron) 0.5 MG tablet TAKE 1 TABLET BY MOUTH EVERY DAY AS NEEDED 04/29/13  Yes Susy Frizzle, MD  estrogens, conjugated, (PREMARIN) 0.625 MG tablet Take 1 tablet (0.625 mg total) by mouth daily. 04/29/13  Yes Susy Frizzle, MD  Ibuprofen-Diphenhydramine Cit (ADVIL PM PO) Take by mouth at bedtime.    Historical Provider, MD   Physical Exam: Filed Vitals:   09/01/13 1241 09/01/13 1247 09/01/13 1440 09/01/13 1557  BP: 114/82 111/74 108/66 132/78  Pulse:  73 76 77  Temp:    98 F (36.7 C)  TempSrc:    Oral  Resp:  16 16 16   Height:      Weight:      SpO2:  99% 99% 100%    Wt Readings from Last 3 Encounters:  09/01/13 71.668 kg (158 lb)  04/29/13 71.215 kg (157 lb)  04/27/12 71.668 kg (  158 lb)    General:  Appears calm and comfortable, AAOx3 Eyes: PERRL, normal lids, irises & conjunctiva ENT: grossly normal lips & tongue Neck: no LAD, masses or thyromegaly Cardiovascular: RRR, no m/r/g. No LE edema. Respiratory: CTA bilaterally, no w/r/r. Normal respiratory effort. Abdomen: soft, nt, nd, bs present Skin: no rash or induration seen on limited exam Musculoskeletal: grossly normal tone BUE/BLE Psychiatric: grossly normal mood and affect, speech fluent and appropriate Neurologic: grossly non-focal.          Labs on Admission:  Basic Metabolic Panel:  Recent Labs Lab 09/01/13 1155  NA 141  K 4.0    CL 100  CO2 28  GLUCOSE 99  BUN 9  CREATININE 0.80  CALCIUM 10.1   Liver Function Tests: No results found for this basename: AST, ALT, ALKPHOS, BILITOT, PROT, ALBUMIN,  in the last 168 hours No results found for this basename: LIPASE, AMYLASE,  in the last 168 hours No results found for this basename: AMMONIA,  in the last 168 hours CBC:  Recent Labs Lab 09/01/13 1155  WBC 10.5  HGB 15.9*  HCT 46.5*  MCV 93.9  PLT 239   Cardiac Enzymes:  Recent Labs Lab 09/01/13 1155  TROPONINI <0.30    BNP (last 3 results) No results found for this basename: PROBNP,  in the last 8760 hours CBG: No results found for this basename: GLUCAP,  in the last 168 hours  Radiological Exams on Admission: Dg Chest 2 View  09/01/2013   CLINICAL DATA:  Chest pain  EXAM: CHEST  2 VIEW  COMPARISON:  None.  FINDINGS: There is no edema or consolidation. The heart size and pulmonary vascularity are normal. No pneumothorax. No adenopathy. No bone lesions.  IMPRESSION: No edema or consolidation.   Electronically Signed   By: Lowella Grip M.D.   On: 09/01/2013 12:40    EKG: Independently reviewed. NSR, t wave inversion in Anteroseptal leads  Assessment/Plan    Chest pain, unspecified -atypical with some typical features -EKG with T wave changes in anterolateral leads -check D-dimer stat -Cycle cardiac enzymes -Will ask Cards to eval    Smoker -counseled    Anxiety -continue xanax PRN  Code Status: Full Code DVT Prophylaxis: lovenox Family Communication: d/w spouse at bedside Disposition Plan: home pending workup  Time spent: 66min  Mary Dominguez [facility] Pager [phone]  **Disclaimer: This note may have been dictated with voice recognition software. Similar sounding words can inadvertently be transcribed and this note may contain transcription errors which may not have been corrected upon publication of note.**

## 2013-09-01 NOTE — ED Notes (Signed)
Pt. Reports feeling loopy and lightheaded after 1 nitro. Pt. Requests not continuing with second nitro. BP 117/74. Pain/Tightness 3/10.

## 2013-09-01 NOTE — Progress Notes (Signed)
48 year old female presenting with chest pain, EKG shows nonspecific ST changes. Troponins negative. Per ED M.D., patient had ongoing chest pain on initial evaluation that was relieved with nitroglycerin-currently no chest pain. Accepted to telemetry, observation.

## 2013-09-01 NOTE — ED Provider Notes (Signed)
CSN: [id number]     Arrival date & time 09/01/13  1143 History   First MD Initiated Contact with Patient 09/01/13 1203     Chief Complaint  Patient presents with  . Chest Pain     (Consider location/radiation/quality/duration/timing/severity/associated sxs/prior Treatment) HPI Comments: Patient is a 48 year old female with no significant past medical history who presents to the emergency department complaining of gradual onset midsternal chest pain beginning around 11:15 today, less than one hour prior to arrival. States this severe, sharp pain that lasted only a few minutes and gradually decreased to a tight feeling which is still present. Pain radiates across the front of her chest and to both sides of her jaw, constant. No aggravating or alleviating factors. States she had similar pain 2 weeks back which she did not have evaluated. States she has felt dizzy and lightheaded for the past 3 weeks, describes the dizziness as more of a lightheaded feeling, worse when she stands for a long period of time or goes from a seated to standing position. Denies nausea, vomiting, diaphoresis, vision changes, numbness, weakness, shortness of breath, fever, chills, cough or abdominal pain. She is a smoker. Denies family history of early heart disease.  Patient is a 48 y.o. female presenting with chest pain. The history is provided by the patient.  Chest Pain Associated symptoms: dizziness     Past Medical History  Diagnosis Date  . Former smoker   . Insomnia    Past Surgical History  Procedure Laterality Date  . Appendectomy    . Oophorectomy      One in 2000  . Abdominal hysterectomy      menorrhagia   Family History  Problem Relation Age of Onset  . Cancer Neg Hx     no breast, ovarian, or colon cancer   History  Substance Use Topics  . Smoking status: Current Every Day Smoker -- 0.75 packs/day    Types: Cigarettes    Last Attempt to Quit: 02/09/2012  . Smokeless tobacco: Not on file  .  Alcohol Use: Yes     Comment: twice a week   OB History   Grav Para Term Preterm Abortions TAB SAB Ect Mult Living                 Review of Systems  Cardiovascular: Positive for chest pain.  Neurological: Positive for dizziness and light-headedness.  All other systems reviewed and are negative.     Allergies  Penicillins  Home Medications   Prior to Admission medications   Medication Sig Start Date End Date Taking? Authorizing Provider  ALPRAZolam Duanne Moron) 0.5 MG tablet TAKE 1 TABLET BY MOUTH EVERY DAY AS NEEDED 04/29/13  Yes Susy Frizzle, MD  estrogens, conjugated, (PREMARIN) 0.625 MG tablet Take 1 tablet (0.625 mg total) by mouth daily. 04/29/13  Yes Susy Frizzle, MD  Ibuprofen-Diphenhydramine Cit (ADVIL PM PO) Take by mouth at bedtime.    Historical Provider, MD   BP 111/74  Pulse 73  Temp(Src) 98.4 F (36.9 C) (Oral)  Resp 16  Ht 5\' 8"  (1.727 m)  Wt 158 lb (71.668 kg)  BMI 24.03 kg/m2  SpO2 99% Physical Exam  Nursing note and vitals reviewed. Constitutional: She is oriented to person, place, and time. She appears well-developed and well-nourished. No distress.  HENT:  Head: Normocephalic and atraumatic.  Mouth/Throat: Oropharynx is clear and moist.  Eyes: Conjunctivae and EOM are normal. Pupils are equal, round, and reactive to light.  Neck: Normal range  of motion. Neck supple. No JVD present.  Cardiovascular: Normal rate, regular rhythm, normal heart sounds and intact distal pulses.   No extremity edema.  Pulmonary/Chest: Effort normal and breath sounds normal. No respiratory distress. She exhibits no tenderness.  Abdominal: Soft. Bowel sounds are normal. There is no tenderness.  Musculoskeletal: Normal range of motion. She exhibits no edema.  Neurological: She is alert and oriented to person, place, and time. She has normal strength. No sensory deficit.  Speech fluent, goal oriented. Moves limbs without ataxia. Equal grip strength bilateral.  Skin:  Skin is warm and dry. She is not diaphoretic.  Psychiatric: She has a normal mood and affect. Her behavior is normal.    ED Course  Procedures (including critical care time) Labs Review Labs Reviewed  CBC - Abnormal; Notable for the following:    Hemoglobin 15.9 (*)    HCT 46.5 (*)    All other components within normal limits  BASIC METABOLIC PANEL - Abnormal; Notable for the following:    GFR calc non Af Amer 85 (*)    All other components within normal limits  TROPONIN I    Imaging Review Dg Chest 2 View  09/01/2013   CLINICAL DATA:  Chest pain  EXAM: CHEST  2 VIEW  COMPARISON:  None.  FINDINGS: There is no edema or consolidation. The heart size and pulmonary vascularity are normal. No pneumothorax. No adenopathy. No bone lesions.  IMPRESSION: No edema or consolidation.   Electronically Signed   By: Lowella Grip M.D.   On: 09/01/2013 12:40     EKG Interpretation   Date/Time:  Wednesday September 01 2013 11:49:59 EDT Ventricular Rate:  78 PR Interval:  132 QRS Duration: 98 QT Interval:  364 QTC Calculation: 414 R Axis:   43 Text Interpretation:  Normal sinus rhythm Nonspecific ST and T wave  abnormality Abnormal ECG Confirmed by DOCHERTY  MD, MEGAN ([id number]) on  09/01/2013 11:51:33 AM      MDM   Final diagnoses:  Atypical chest pain   Pt presenting with chest pain, atypical. She is non-toxic appearing and in NAD. AFVSS. EKG showing nonspecific ST/T waves inferiorly. Workup negative. Symptoms improved with nitro. Given patient's story is moderately suspicious and she has a heart score 4, she will be admitted for observation. Admission accepted by Dr. Tera Helper, [facility].  Illene Labrador, PA-C 09/01/13 1401

## 2013-09-01 NOTE — ED Notes (Signed)
Per [place] PA ok for pt. To self administer premarin 0.625 MG 1 tablet.

## 2013-09-02 ENCOUNTER — Inpatient Hospital Stay (HOSPITAL_COMMUNITY): Payer: BC Managed Care – PPO

## 2013-09-02 DIAGNOSIS — R079 Chest pain, unspecified: Secondary | ICD-10-CM

## 2013-09-02 DIAGNOSIS — R072 Precordial pain: Secondary | ICD-10-CM

## 2013-09-02 LAB — LIPID PANEL
Cholesterol: 211 mg/dL — ABNORMAL HIGH (ref 0–200)
HDL: 40 mg/dL (ref >39)
LDL Cholesterol: 120 mg/dL — ABNORMAL HIGH (ref 0–99)
Total CHOL/HDL Ratio: 5.3 RATIO
Triglycerides: 255 mg/dL — ABNORMAL HIGH (ref <150)
VLDL: 51 mg/dL — ABNORMAL HIGH (ref 0–40)

## 2013-09-02 LAB — TROPONIN I
Troponin I: <0.3 ng/mL (ref <0.30)
Troponin I: <0.3 ng/mL (ref <0.30)

## 2013-09-02 LAB — CBC
HCT: 45.7 % (ref 36.0–46.0)
Hemoglobin: 15 g/dL (ref 12.0–15.0)
MCH: 31.2 pg (ref 26.0–34.0)
MCHC: 32.8 g/dL (ref 30.0–36.0)
MCV: 95 fL (ref 78.0–100.0)
Platelets: 206 10*3/uL (ref 150–400)
RBC: 4.81 MIL/uL (ref 3.87–5.11)
RDW: 12.4 % (ref 11.5–15.5)
WBC: 7.2 10*3/uL (ref 4.0–10.5)

## 2013-09-02 LAB — BASIC METABOLIC PANEL
Anion gap: 13 (ref 5–15)
BUN: 10 mg/dL (ref 6–23)
CO2: 27 mEq/L (ref 19–32)
Calcium: 8.9 mg/dL (ref 8.4–10.5)
Chloride: 104 mEq/L (ref 96–112)
Creatinine, Ser: 0.76 mg/dL (ref 0.50–1.10)
GFR calc Af Amer: >90 mL/min (ref >90)
GFR calc non Af Amer: >90 mL/min (ref >90)
Glucose, Bld: 91 mg/dL (ref 70–99)
Potassium: 4 mEq/L (ref 3.7–5.3)
Sodium: 144 mEq/L (ref 137–147)

## 2013-09-02 LAB — TSH: TSH: 0.667 u[IU]/mL (ref 0.350–4.500)

## 2013-09-02 MED ORDER — TECHNETIUM TC 99M SESTAMIBI GENERIC - CARDIOLITE
30.0000 | Freq: Once | INTRAVENOUS | Status: AC | PRN
  Administered 2013-09-02: 30 via INTRAVENOUS

## 2013-09-02 MED ORDER — TECHNETIUM TC 99M SESTAMIBI GENERIC - CARDIOLITE
10.0000 | Freq: Once | INTRAVENOUS | Status: AC | PRN
  Administered 2013-09-02: 10 via INTRAVENOUS

## 2013-09-02 MED ORDER — PERFLUTREN LIPID MICROSPHERE
1.0000 mL | INTRAVENOUS | Status: AC | PRN
  Administered 2013-09-02: 8 mL via INTRAVENOUS
  Filled 2013-09-02: qty 10

## 2013-09-02 NOTE — Progress Notes (Signed)
PT Cancellation Note  Patient Details Name: Mary Dominguez MRN: [id number] DOB: Oct 20, 1965   Cancelled Treatment:    Reason Eval Not Completed: PT screened, no needs identified, will sign off  Interviewed pt re: her dizziness. It does not appear to be vestibular in nature--She has no sense of movement/vertigo, it is constantly present at a low level and is exacerbated by prolonged speaking, prolonged static standing (showering or morning ADLs), and sometimes driving. She states the sensation differs in different situations (develops tunnel vision, feels like her head is "floating off my shoulders," and at times a sense of heaviness in her head ("my head goes from feeling as light as cotton to feeling like it's made of lead."  She denies h/o migraines, recent major life stressors, or changes in medication. She does report worsening insomnia (and associated fatigue) and menopausal symptoms.   Jeylin Woodmansee 09/02/2013, 2:51 PM Pager [phone]

## 2013-09-02 NOTE — Progress Notes (Signed)
Reviewed discharge instructions with patient and husband and they stated their understanding.  Discharged home with her husband.  Filed Vitals:   09/02/13 1435  BP: 104/68  Pulse: 77  Temp: 98.4 F (36.9 C)  Resp: 16   Rechy Bost, Harlow Mares

## 2013-09-02 NOTE — Discharge Instructions (Signed)

## 2013-09-02 NOTE — Progress Notes (Signed)
Utilization review completed- retro 

## 2013-09-02 NOTE — Progress Notes (Signed)
Echocardiogram 2D Echocardiogram has been performed.  Mary Dominguez 09/02/2013, 12:43 PM

## 2013-09-02 NOTE — Progress Notes (Signed)
Patient Name: Mary Dominguez Date of Encounter: 09/02/2013     Active Problems:   Smoker   Chest pain, unspecified   Chest pain    SUBJECTIVE  Saw patient in nuclear stress lab for exercise myoview. She tolerated the procedure well. She walked 7 min 53 sec and reached target HR of 145 bpm. She then abruptly stopped due to leg fatigue and dizziness. Slight chest tightness.   CURRENT MEDS . aspirin  325 mg Oral Daily  . enoxaparin (LOVENOX) injection  40 mg Subcutaneous Q24H    OBJECTIVE  Filed Vitals:   09/01/13 2042 09/02/13 0535 09/02/13 0924 09/02/13 0940  BP: 122/77 103/70 115/71 104/69  Pulse: 68 72    Temp: 97.8 F (36.6 C) 97.9 F (36.6 C)    TempSrc: Oral Oral    Resp: 16 16    Height:      Weight:  159 lb (72.122 kg)    SpO2: 97% 99%      Intake/Output Summary (Last 24 hours) at 09/02/13 0941 Last data filed at 09/02/13 0900  Gross per 24 hour  Intake      0 ml  Output      0 ml  Net      0 ml   Filed Weights   09/01/13 1147 09/02/13 0535  Weight: 158 lb (71.668 kg) 159 lb (72.122 kg)    PHYSICAL EXAM  General: Pleasant, NAD. Neuro: Alert and oriented X 3. Moves all extremities spontaneously. Psych: Normal affect. HEENT:  Normal  Neck: Supple without bruits or JVD. Lungs:  Resp regular and unlabored, CTA. Heart: RRR no s3, s4, or murmurs. Abdomen: Soft, non-tender, non-distended, BS + x 4.  Extremities: No clubbing, cyanosis or edema. DP/PT/Radials 2+ and equal bilaterally.  Accessory Clinical Findings  CBC  Recent Labs  09/01/13 1155 09/02/13 0543  WBC 10.5 7.2  HGB 15.9* 15.0  HCT 46.5* 45.7  MCV 93.9 95.0  PLT 239 361   Basic Metabolic Panel  Recent Labs  09/01/13 1155 09/02/13 0543  NA 141 144  K 4.0 4.0  CL 100 104  CO2 28 27  GLUCOSE 99 91  BUN 9 10  CREATININE 0.80 0.76  CALCIUM 10.1 8.9   Liver Function Tests  Recent Labs  09/01/13 1852  AST 20  ALT 19  ALKPHOS 69  BILITOT <0.2*  PROT 6.8  ALBUMIN  3.7    Cardiac Enzymes  Recent Labs  09/01/13 1155 09/01/13 1852 09/02/13 0543  TROPONINI <0.30 <0.30 <0.30    D-Dimer  Recent Labs  09/01/13 1852  DDIMER 0.35    Fasting Lipid Panel  Recent Labs  09/02/13 0543  CHOL 211*  HDL 40  LDLCALC 120*  TRIG 255*  CHOLHDL 5.3   TELE  NSR  Radiology/Studies  Dg Chest 2 View  09/01/2013   CLINICAL DATA:  Chest pain  EXAM: CHEST  2 VIEW  COMPARISON:  None.  FINDINGS: There is no edema or consolidation. The heart size and pulmonary vascularity are normal. No pneumothorax. No adenopathy. No bone lesions.  IMPRESSION: No edema or consolidation.   Electronically Signed   By: Lowella Grip M.D.   On: 09/01/2013 12:40    ASSESSMENT AND PLAN Mary Dominguez is a 48 y.o. female with no PMH but significant tobacco use who presented to the ED yesterday with complaints of CP.  Chest pain with typical and atypical components. Her CRF include tobacco use and post menopausal state.  -- EKG is  nonischemic and troponin neg x3. -- Underwent exercise nuclear stress test today. Will await images.  -- 2D echo to assess LVF for today.  Tobacco abuse ongoing- cessation counseled.   HLD- lipid panel done on this admission for further risk stratification. TC 211; TG 255; HDL 40; LDL 120.   Judy Pimple PA-C  Pager [phone]   History and all data above reviewed.  Patient examined.  I agree with the findings as above.  No further chest pain.  No SOB  The patient exam reveals COR:RRR  ,  Lungs: Clear  ,  Abd: Positive bowel sounds, no rebound no guarding, Ext No edema  .  All available labs, radiology testing, previous records reviewed. Agree with documented assessment and plan. Chest pain:  No further in patient work up if negative stress.  We did discuss stop smoking strategies.    Mary Dominguez Mary Dominguez  12:34 PM  09/02/2013

## 2013-09-07 ENCOUNTER — Ambulatory Visit (INDEPENDENT_AMBULATORY_CARE_PROVIDER_SITE_OTHER): Payer: BC Managed Care – PPO | Admitting: Family Medicine

## 2013-09-07 ENCOUNTER — Encounter: Payer: Self-pay | Admitting: Family Medicine

## 2013-09-07 VITALS — BP 128/80 | HR 80 | Temp 98.3°F | Resp 16 | Ht 67.0 in | Wt 156.0 lb

## 2013-09-07 DIAGNOSIS — Z09 Encounter for follow-up examination after completed treatment for conditions other than malignant neoplasm: Secondary | ICD-10-CM

## 2013-09-07 DIAGNOSIS — K219 Gastro-esophageal reflux disease without esophagitis: Secondary | ICD-10-CM

## 2013-09-07 MED ORDER — OMEPRAZOLE 40 MG PO CPDR: 40.0000 mg | DELAYED_RELEASE_CAPSULE | Freq: Every day | ORAL | Status: DC

## 2013-09-07 NOTE — Progress Notes (Signed)
Subjective:    Patient ID: Mary Dominguez, female    DOB: [birth year], 47 y.o.   MRN: [id number]  HPI Patient was recently admitted to the hospital with substernal chest pain it radiated into her left child. At that time she underwent a stress test/Myoview that was normal. She also had a chest x-ray that was normal. A 2-D echocardiogram was also normal. Troponins were normal. The patient was diagnosed with noncardiac chest pain and discharged home. She's been recommended stress recently with her job. She is constantly worrying about work. She's also been having episodes of lightheadedness which sounded like presyncope and dizziness. She denies a true syncope. She has been having occasional rapid heartbeats. This is usually associated with lightheadedness and is also associated with stress. They resolve spontaneously on their own.  She denies any shortness of breath or dyspnea on exertion. She is taking estrogens. D-dimer was negative for PE. Past Medical History  Diagnosis Date  . Former smoker   . Insomnia    Past Surgical History  Procedure Laterality Date  . Appendectomy    . Oophorectomy      One in 2000  . Abdominal hysterectomy      menorrhagia   Current Outpatient Prescriptions on File Prior to Visit  Medication Sig Dispense Refill  . ALPRAZolam (XANAX) 0.5 MG tablet Take 0.5 mg by mouth 2 (two) times daily as needed for anxiety.      Marland Kitchen estrogens, conjugated, (PREMARIN) 0.625 MG tablet Take 0.625 mg by mouth daily.       No current facility-administered medications on file prior to visit.   Allergies  Allergen Reactions  . Penicillins     Childhood reaction   History   Social History  . Marital Status: Married    Spouse Name: N/A    Number of Children: N/A  . Years of Education: N/A   Occupational History  . Not on file.   Social History Main Topics  . Smoking status: Current Every Day Smoker -- 0.75 packs/day    Types: Cigarettes    Last Attempt to Quit:  02/09/2012  . Smokeless tobacco: Not on file  . Alcohol Use: Yes     Comment: twice a week  . Drug Use: No  . Sexual Activity: Yes   Other Topics Concern  . Not on file   Social History Narrative  . No narrative on file       Review of Systems  All other systems reviewed and are negative.      Objective:   Physical Exam  Vitals reviewed. Constitutional: She appears well-developed and well-nourished.  Neck: Neck supple. No JVD present.  Cardiovascular: Normal rate, regular rhythm and normal heart sounds.  Exam reveals no gallop and no friction rub.   No murmur heard. Pulmonary/Chest: Effort normal and breath sounds normal. No respiratory distress. She has no wheezes. She has no rales.  Abdominal: Soft. Bowel sounds are normal. She exhibits no distension. There is no tenderness. There is no rebound and no guarding.  Musculoskeletal: Normal range of motion. She exhibits no edema.  Lymphadenopathy:    She has no cervical adenopathy.          Assessment & Plan:  Gastroesophageal reflux disease without esophagitis - Plan: omeprazole (PRILOSEC) 40 MG capsule  Hospital discharge follow-up   I believe the patient's chest pain was likely due to gastroesophageal reflux. We'll start her on omeprazole 40 mg by mouth daily. If pain continues, I would recommend seeing  a GI doctor for an EGD. I believe the patient's presyncope as likely vasovagal and related to stress and anxiety stemming from work. I believe the patient is experiencing tachycardia due to anxiety. I believe she is having mild panic attacks. I did recommend having the patient where an event monitor to rule out cardiac arrhythmias which she declined. She too believes this is stressed. She like to make some changes in her life to reduce her stress load and see if symptoms improve.  Recheck in 2 weeks or immediately if worse.

## 2013-09-16 NOTE — Discharge Summary (Signed)
Physician Discharge Summary  Mary Dominguez IHK:[id number] DOB: 1965/04/21 DOA: 09/01/2013  PCP: Odette Fraction, MD  Admit date: 09/01/2013 Discharge date: 09/02/2013  Time spent: >45 minutes  Discharge Condition: stable  Diet recommendation: heart healthy  Discharge Diagnoses:  Principal Problem:   Chest pain, unspecified Active Problems:   Smoker   History of present illness:  This is a 48 y/o female with PMH of tobacco abuse presenting to the ER with chest pain.She was in her usual state of health till 2 weeks ago, started having intermittent chest pain, which was sharp transient and self limiting.  On the day of admission, while she was in a meeting, she started experiencing severe sharp chest pain, that was also like tightness/pressure, radiating to jaw and back, it lasted for around 40min and went to the ER.  She was given some SL nitro and her chest pain improved.  In addition she has also had intermittent dizziness for few days   Hospital Course:  Chest pain - cardiology consult requested, cardiac enzymes x 3 sets were negative - she underwent a Myoview stress test and had an ECHO, both of which were normall- see reports below - she had no further episodes of pain during the hospital stay and has been quite stable  Tobacco abuse - advised to discontinue  Hyperlipidemia - advise a low cholesterol diet and repeat lipid panel in 2-3 months   Procedures: Myoview 1. No reversible ischemia or infarction.  2. Normal left ventricular wall motion.  3. Left ventricular ejection fraction 67%  4. Low-risk stress test findings*.    Consultations:  cardiology  Discharge Exam: Filed Weights   09/01/13 1147 09/02/13 0535  Weight: 71.668 kg (158 lb) 72.122 kg (159 lb)   Filed Vitals:   09/02/13 1435  BP: 104/68  Pulse: 77  Temp: 98.4 F (36.9 C)  Resp: 16    General: AAO x 3, no distress Cardiovascular: RRR, no murmurs  Respiratory: clear to auscultation  bilaterally GI: soft, non-tender, non-distended, bowel sound positive  Discharge Instructions You were cared for by a hospitalist during your hospital stay. If you have any questions about your discharge medications or the care you received while you were in the hospital after you are discharged, you can call the unit and asked to speak with the hospitalist on call if the hospitalist that took care of you is not available. Once you are discharged, your primary care physician will handle any further medical issues. Please note that NO REFILLS for any discharge medications will be authorized once you are discharged, as it is imperative that you return to your primary care physician (or establish a relationship with a primary care physician if you do not have one) for your aftercare needs so that they can reassess your need for medications and monitor your lab values.      Discharge Instructions   Diet - low sodium heart healthy    Complete by:  As directed      Increase activity slowly    Complete by:  As directed             Medication List         ALPRAZolam 0.5 MG tablet  Commonly known as:  XANAX  Take 0.5 mg by mouth 2 (two) times daily as needed for anxiety.     estrogens (conjugated) 0.625 MG tablet  Commonly known as:  PREMARIN  Take 0.625 mg by mouth daily.       Allergies  Allergen Reactions  . Penicillins     Childhood reaction      The results of significant diagnostics from this hospitalization (including imaging, microbiology, ancillary and laboratory) are listed below for reference.    Significant Diagnostic Studies: Dg Chest 2 View  09/01/2013   CLINICAL DATA:  Chest pain  EXAM: CHEST  2 VIEW  COMPARISON:  None.  FINDINGS: There is no edema or consolidation. The heart size and pulmonary vascularity are normal. No pneumothorax. No adenopathy. No bone lesions.  IMPRESSION: No edema or consolidation.   Electronically Signed   By: Lowella Grip M.D.   On:  09/01/2013 12:40   Nm Myocar Multi W/spect W/wall Motion / Ef  09/02/2013   CLINICAL DATA:  Typical and atypical chest pain.  Tobacco abuse.  EXAM: MYOCARDIAL IMAGING WITH SPECT (REST AND EXERCISE)  GATED LEFT VENTRICULAR WALL MOTION STUDY  LEFT VENTRICULAR EJECTION FRACTION  TECHNIQUE: Standard myocardial SPECT imaging was performed after resting intravenous injection of 10 mCi Tc-28m sestamibi. Subsequently, exercise tolerance test was performed by the patient under the supervision of the Cardiology staff. At peak-stress, 30 mCi Tc-57m sestamibi was injected intravenously and standard myocardial SPECT imaging was performed. Quantitative gated imaging was also performed to evaluate left ventricular wall motion, and estimate left ventricular ejection fraction.  COMPARISON:  Two-view chest x-ray 09/01/2013  FINDINGS: Perfusion: No decreased activity in the left ventricle on stress imaging to suggest reversible ischemia or infarction.  Wall Motion: Normal left ventricular wall motion. No left ventricular dilation.  Left Ventricular Ejection Fraction: 62% %  End diastolic volume 56 ml  End systolic volume 18 ml  IMPRESSION: 1. No reversible ischemia or infarction.  2. Normal left ventricular wall motion.  3. Left ventricular ejection fraction 67%  4. Low-risk stress test findings*.  *2012 Appropriate Use Criteria for Coronary Revascularization Focused Update: J Am Coll Cardiol. 8366;29(4):765-465. http://content.airportbarriers.com.aspx?articleid=1201161   Electronically Signed   By: Lawrence Santiago M.D.   On: 09/02/2013 12:57    Microbiology: No results found for this or any previous visit (from the past 240 hour(s)).   Labs: Basic Metabolic Panel: No results found for this basename: NA, K, CL, CO2, GLUCOSE, BUN, CREATININE, CALCIUM, MG, PHOS,  in the last 168 hours Liver Function Tests: No results found for this basename: AST, ALT, ALKPHOS, BILITOT, PROT, ALBUMIN,  in the last 168 hours No results  found for this basename: LIPASE, AMYLASE,  in the last 168 hours No results found for this basename: AMMONIA,  in the last 168 hours CBC: No results found for this basename: WBC, NEUTROABS, HGB, HCT, MCV, PLT,  in the last 168 hours Cardiac Enzymes: No results found for this basename: CKTOTAL, CKMB, CKMBINDEX, TROPONINI,  in the last 168 hours BNP: BNP (last 3 results) No results found for this basename: PROBNP,  in the last 8760 hours CBG: No results found for this basename: GLUCAP,  in the last 168 hours     Signed:  Debbe Odea, MD [facility] [phone], 10:56 AM

## 2013-09-23 ENCOUNTER — Other Ambulatory Visit: Payer: Self-pay | Admitting: Family Medicine

## 2013-09-23 NOTE — Telephone Encounter (Signed)
?   OK to Refill  

## 2013-09-23 NOTE — Telephone Encounter (Signed)
ok 

## 2013-11-04 ENCOUNTER — Other Ambulatory Visit: Payer: Self-pay | Admitting: Family Medicine

## 2013-11-05 NOTE — Telephone Encounter (Signed)
ok 

## 2013-11-05 NOTE — Telephone Encounter (Signed)
?   OK to Refill  

## 2013-12-07 ENCOUNTER — Ambulatory Visit: Payer: BC Managed Care – PPO | Admitting: Family Medicine

## 2013-12-10 ENCOUNTER — Ambulatory Visit: Payer: BC Managed Care – PPO | Admitting: Family Medicine

## 2013-12-26 ENCOUNTER — Other Ambulatory Visit: Payer: Self-pay | Admitting: Family Medicine

## 2014-02-15 ENCOUNTER — Other Ambulatory Visit: Payer: Self-pay | Admitting: Family Medicine

## 2014-02-15 NOTE — Telephone Encounter (Signed)
?   OK to Refill  

## 2014-02-15 NOTE — Telephone Encounter (Signed)
ok 

## 2014-02-16 NOTE — Telephone Encounter (Signed)
Rx called in 

## 2014-05-02 ENCOUNTER — Other Ambulatory Visit: Payer: Self-pay

## 2014-05-03 ENCOUNTER — Other Ambulatory Visit: Payer: BLUE CROSS/BLUE SHIELD

## 2014-05-03 DIAGNOSIS — Z0000 Encounter for general adult medical examination without abnormal findings: Secondary | ICD-10-CM

## 2014-05-03 DIAGNOSIS — E785 Hyperlipidemia, unspecified: Secondary | ICD-10-CM

## 2014-05-03 DIAGNOSIS — Z79899 Other long term (current) drug therapy: Secondary | ICD-10-CM

## 2014-05-03 DIAGNOSIS — Z87891 Personal history of nicotine dependence: Secondary | ICD-10-CM

## 2014-05-03 LAB — COMPLETE METABOLIC PANEL WITH GFR
ALT: 21 U/L (ref 0–35)
AST: 21 U/L (ref 0–37)
Albumin: 4.2 g/dL (ref 3.5–5.2)
Alkaline Phosphatase: 65 U/L (ref 39–117)
BUN: 12 mg/dL (ref 6–23)
CO2: 28 mEq/L (ref 19–32)
Calcium: 9.3 mg/dL (ref 8.4–10.5)
Chloride: 102 mEq/L (ref 96–112)
Creat: 0.73 mg/dL (ref 0.50–1.10)
GFR, Est African American: >89 mL/min
GFR, Est Non African American: >89 mL/min
Glucose, Bld: 95 mg/dL (ref 70–99)
Potassium: 4.5 mEq/L (ref 3.5–5.3)
Sodium: 138 mEq/L (ref 135–145)
Total Bilirubin: 0.5 mg/dL (ref 0.2–1.2)
Total Protein: 7.1 g/dL (ref 6.0–8.3)

## 2014-05-03 LAB — CBC WITH DIFFERENTIAL/PLATELET
Basophils Absolute: 0.1 10*3/uL (ref 0.0–0.1)
Basophils Relative: 1 % (ref 0–1)
Eosinophils Absolute: 0.2 10*3/uL (ref 0.0–0.7)
Eosinophils Relative: 3 % (ref 0–5)
HCT: 44.6 % (ref 36.0–46.0)
Hemoglobin: 14.9 g/dL (ref 12.0–15.0)
Lymphocytes Relative: 24 % (ref 12–46)
Lymphs Abs: 1.6 10*3/uL (ref 0.7–4.0)
MCH: 31 pg (ref 26.0–34.0)
MCHC: 33.4 g/dL (ref 30.0–36.0)
MCV: 92.9 fL (ref 78.0–100.0)
MPV: 10.4 fL (ref 8.6–12.4)
Monocytes Absolute: 0.5 10*3/uL (ref 0.1–1.0)
Monocytes Relative: 7 % (ref 3–12)
Neutro Abs: 4.2 10*3/uL (ref 1.7–7.7)
Neutrophils Relative %: 65 % (ref 43–77)
Platelets: 231 10*3/uL (ref 150–400)
RBC: 4.8 MIL/uL (ref 3.87–5.11)
RDW: 13.1 % (ref 11.5–15.5)
WBC: 6.5 10*3/uL (ref 4.0–10.5)

## 2014-05-03 LAB — LIPID PANEL
Cholesterol: 247 mg/dL — ABNORMAL HIGH (ref 0–200)
HDL: 52 mg/dL (ref >=46)
LDL Cholesterol: 166 mg/dL — ABNORMAL HIGH (ref 0–99)
Total CHOL/HDL Ratio: 4.8 Ratio
Triglycerides: 147 mg/dL (ref <150)
VLDL: 29 mg/dL (ref 0–40)

## 2014-05-03 LAB — TSH: TSH: 0.92 u[IU]/mL (ref 0.350–4.500)

## 2014-05-05 ENCOUNTER — Encounter: Payer: Self-pay | Admitting: Family Medicine

## 2014-05-05 ENCOUNTER — Ambulatory Visit (INDEPENDENT_AMBULATORY_CARE_PROVIDER_SITE_OTHER): Payer: BLUE CROSS/BLUE SHIELD | Admitting: Family Medicine

## 2014-05-05 VITALS — BP 118/80 | HR 68 | Temp 98.0°F | Resp 16 | Ht 67.0 in | Wt 158.0 lb

## 2014-05-05 DIAGNOSIS — H109 Unspecified conjunctivitis: Secondary | ICD-10-CM | POA: Diagnosis not present

## 2014-05-05 DIAGNOSIS — Z0000 Encounter for general adult medical examination without abnormal findings: Secondary | ICD-10-CM

## 2014-05-05 MED ORDER — POLYMYXIN B-TRIMETHOPRIM 10000-0.1 UNIT/ML-% OP SOLN: 2.0000 [drp] | OPHTHALMIC | Status: DC

## 2014-05-05 NOTE — Progress Notes (Signed)
Subjective:    Patient ID: [id number] Dominguez, female    DOB: 23-Apr-1965, 49 y.o.   MRN: [id number]  HPI Patient's daughter-in-law has bacterial conjunctivitis in her right eye. Patient was around her daughter-in-law yesterday. She now has burning in her right eye. She has some mild erythema. She is here today mainly however for a physical exam. Past medical history is significant for hysterectomy. Therefore she does not require a Pap smear. Her mammogram was performed in June of last year and was normal. She is not due for colonoscopy until after August. Her most recent lab work as listed below: Lab on 05/03/2014  Component Date Value Ref Range Status  . Sodium 05/03/2014 138  135 - 145 mEq/L Final  . Potassium 05/03/2014 4.5  3.5 - 5.3 mEq/L Final  . Chloride 05/03/2014 102  96 - 112 mEq/L Final  . CO2 05/03/2014 28  19 - 32 mEq/L Final  . Glucose, Bld 05/03/2014 95  70 - 99 mg/dL Final  . BUN 05/03/2014 12  6 - 23 mg/dL Final  . Creat 05/03/2014 0.73  0.50 - 1.10 mg/dL Final  . Total Bilirubin 05/03/2014 0.5  0.2 - 1.2 mg/dL Final  . Alkaline Phosphatase 05/03/2014 65  39 - 117 U/L Final  . AST 05/03/2014 21  0 - 37 U/L Final  . ALT 05/03/2014 21  0 - 35 U/L Final  . Total Protein 05/03/2014 7.1  6.0 - 8.3 g/dL Final  . Albumin 05/03/2014 4.2  3.5 - 5.2 g/dL Final  . Calcium 05/03/2014 9.3  8.4 - 10.5 mg/dL Final  . GFR, Est African American 05/03/2014 >89   Final  . GFR, Est Non African American 05/03/2014 >89   Final   Comment:   The estimated GFR is a calculation valid for adults (>=58 years old) that uses the CKD-EPI algorithm to adjust for age and sex. It is   not to be used for children, pregnant women, hospitalized patients,    patients on dialysis, or with rapidly changing kidney function. According to the NKDEP, eGFR >89 is normal, 60-89 shows mild impairment, 30-59 shows moderate impairment, 15-29 shows severe impairment and <15 is ESRD.     . TSH 05/03/2014 0.920  0.350  - 4.500 uIU/mL Final  . Cholesterol 05/03/2014 247* 0 - 200 mg/dL Final   Comment: ATP III Classification:       < 200        mg/dL        Desirable      200 - 239     mg/dL        Borderline High      >= 240        mg/dL        High     . Triglycerides 05/03/2014 147  <150 mg/dL Final  . HDL 05/03/2014 52  >=46 mg/dL Final   ** Please note change in reference range(s). **  . Total CHOL/HDL Ratio 05/03/2014 4.8   Final  . VLDL 05/03/2014 29  0 - 40 mg/dL Final  . LDL Cholesterol 05/03/2014 166* 0 - 99 mg/dL Final   Comment:   Total Cholesterol/HDL Ratio:CHD Risk                        Coronary Heart Disease Risk Table  Men       Women          1/2 Average Risk              3.4        3.3              Average Risk              5.0        4.4           2X Average Risk              9.6        7.1           3X Average Risk             23.4       11.0 Use the calculated Patient Ratio above and the CHD Risk table  to determine the patient's CHD Risk. ATP III Classification (LDL):       < 100        mg/dL         Optimal      100 - 129     mg/dL         Near or Above Optimal      130 - 159     mg/dL         Borderline High      160 - 189     mg/dL         High       > 190        mg/dL         Very High     . WBC 05/03/2014 6.5  4.0 - 10.5 K/uL Final  . RBC 05/03/2014 4.80  3.87 - 5.11 MIL/uL Final  . Hemoglobin 05/03/2014 14.9  12.0 - 15.0 g/dL Final  . HCT 05/03/2014 44.6  36.0 - 46.0 % Final  . MCV 05/03/2014 92.9  78.0 - 100.0 fL Final  . MCH 05/03/2014 31.0  26.0 - 34.0 pg Final  . MCHC 05/03/2014 33.4  30.0 - 36.0 g/dL Final  . RDW 05/03/2014 13.1  11.5 - 15.5 % Final  . Platelets 05/03/2014 231  150 - 400 K/uL Final  . MPV 05/03/2014 10.4  8.6 - 12.4 fL Final  . Neutrophils Relative % 05/03/2014 65  43 - 77 % Final  . Neutro Abs 05/03/2014 4.2  1.7 - 7.7 K/uL Final  . Lymphocytes Relative 05/03/2014 24  12 - 46 % Final  . Lymphs Abs  05/03/2014 1.6  0.7 - 4.0 K/uL Final  . Monocytes Relative 05/03/2014 7  3 - 12 % Final  . Monocytes Absolute 05/03/2014 0.5  0.1 - 1.0 K/uL Final  . Eosinophils Relative 05/03/2014 3  0 - 5 % Final  . Eosinophils Absolute 05/03/2014 0.2  0.0 - 0.7 K/uL Final  . Basophils Relative 05/03/2014 1  0 - 1 % Final  . Basophils Absolute 05/03/2014 0.1  0.0 - 0.1 K/uL Final  . Smear Review 05/03/2014 Criteria for review not met   Final   Past Medical History  Diagnosis Date  . Former smoker   . Insomnia    Past Surgical History  Procedure Laterality Date  . Appendectomy    . Oophorectomy      One in 2000  . Abdominal hysterectomy      menorrhagia   Current Outpatient Prescriptions on File Prior to Visit  Medication Sig Dispense Refill  . ALPRAZolam (XANAX) 0.5 MG tablet TAKE 1 TABLET BY MOUTH EVERY DAY 30 tablet 2  . estrogens, conjugated, (PREMARIN) 0.625 MG tablet Take 0.625 mg by mouth daily.    Marland Kitchen omeprazole (PRILOSEC) 40 MG capsule TAKE 1 CAPSULE (40 MG TOTAL) BY MOUTH DAILY. 30 capsule 11   No current facility-administered medications on file prior to visit.   Allergies  Allergen Reactions  . Penicillins     Childhood reaction   History   Social History  . Marital Status: Married    Spouse Name: N/A  . Number of Children: N/A  . Years of Education: N/A   Occupational History  . Not on file.   Social History Main Topics  . Smoking status: Current Every Day Smoker -- 0.75 packs/day    Types: Cigarettes    Last Attempt to Quit: 02/09/2012  . Smokeless tobacco: Not on file  . Alcohol Use: Yes     Comment: twice a week  . Drug Use: No  . Sexual Activity: Yes   Other Topics Concern  . Not on file   Social History Narrative   Family History  Problem Relation Age of Onset  . Cancer Neg Hx     no breast, ovarian, or colon cancer      Review of Systems  All other systems reviewed and are negative.      Objective:   Physical Exam  Constitutional: She is  oriented to person, place, and time. She appears well-developed and well-nourished. No distress.  HENT:  Head: Normocephalic and atraumatic.  Right Ear: External ear normal.  Left Ear: External ear normal.  Nose: Nose normal.  Mouth/Throat: Oropharynx is clear and moist. No oropharyngeal exudate.  Eyes: EOM are normal. Pupils are equal, round, and reactive to light. Right eye exhibits no discharge. Left eye exhibits no discharge. No scleral icterus.  Neck: Normal range of motion. Neck supple. No JVD present. No tracheal deviation present. No thyromegaly present.  Cardiovascular: Normal rate, regular rhythm, normal heart sounds and intact distal pulses.  Exam reveals no gallop and no friction rub.   No murmur heard. Pulmonary/Chest: Effort normal and breath sounds normal. No stridor. No respiratory distress. She has no wheezes. She has no rales. She exhibits no tenderness.  Abdominal: Soft. Bowel sounds are normal. She exhibits no distension and no mass. There is no tenderness. There is no rebound and no guarding.  Musculoskeletal: Normal range of motion. She exhibits no edema or tenderness.  Lymphadenopathy:    She has no cervical adenopathy.  Neurological: She is alert and oriented to person, place, and time. She has normal reflexes. She displays normal reflexes. No cranial nerve deficit. She exhibits normal muscle tone. Coordination normal.  Skin: Skin is warm. No rash noted. She is not diaphoretic. No erythema. No pallor.  Psychiatric: She has a normal mood and affect. Her behavior is normal. Judgment and thought content normal.  Vitals reviewed.         Assessment & Plan:  Conjunctivitis, unspecified laterality - Plan: trimethoprim-polymyxin b (POLYTRIM) ophthalmic solution  Routine general medical examination at a health care facility  patient's cancer screening is up-to-date. Immunizations are up-to-date. Lab work is excellent except for a rise in her LDL cholesterol. Goal LDL  cholesterol is less than 130. She would like to try significant lifestyle changes including a low saturated fat diet, diet, exercise, weight loss and recheck in 3 months. The remainder of her physical exam is normal  outside of early conjunctivitis. I will give her Polytrim drops 2 drops every 6 hours for the next 7 days.

## 2014-05-18 ENCOUNTER — Other Ambulatory Visit: Payer: Self-pay | Admitting: Family Medicine

## 2014-05-18 NOTE — Telephone Encounter (Signed)
Ok to refill??  Last office visit 05/05/2014.  Last refill 02/15/2014, #2 refills.

## 2014-05-19 NOTE — Telephone Encounter (Signed)
ok 

## 2014-05-19 NOTE — Telephone Encounter (Signed)
Medication called to pharmacy. 

## 2014-05-20 ENCOUNTER — Other Ambulatory Visit: Payer: Self-pay | Admitting: Family Medicine

## 2014-07-10 ENCOUNTER — Emergency Department (HOSPITAL_COMMUNITY): Payer: BLUE CROSS/BLUE SHIELD

## 2014-07-10 ENCOUNTER — Emergency Department (HOSPITAL_COMMUNITY)
Admission: EM | Admit: 2014-07-10 | Discharge: 2014-07-10 | Disposition: A | Discharge status: Home / Self Care | Payer: BLUE CROSS/BLUE SHIELD | Attending: Emergency Medicine | Admitting: Emergency Medicine

## 2014-07-10 ENCOUNTER — Encounter (HOSPITAL_COMMUNITY): Payer: Self-pay | Admitting: *Deleted

## 2014-07-10 DIAGNOSIS — Z793 Long term (current) use of hormonal contraceptives: Secondary | ICD-10-CM | POA: Insufficient documentation

## 2014-07-10 DIAGNOSIS — S99921A Unspecified injury of right foot, initial encounter: Secondary | ICD-10-CM | POA: Diagnosis present

## 2014-07-10 DIAGNOSIS — Z23 Encounter for immunization: Secondary | ICD-10-CM | POA: Insufficient documentation

## 2014-07-10 DIAGNOSIS — Z880 Allergy status to penicillin: Secondary | ICD-10-CM | POA: Diagnosis not present

## 2014-07-10 DIAGNOSIS — Z720 Tobacco use: Secondary | ICD-10-CM | POA: Insufficient documentation

## 2014-07-10 DIAGNOSIS — Y9289 Other specified places as the place of occurrence of the external cause: Secondary | ICD-10-CM | POA: Diagnosis not present

## 2014-07-10 DIAGNOSIS — S91114A Laceration without foreign body of right lesser toe(s) without damage to nail, initial encounter: Secondary | ICD-10-CM | POA: Diagnosis not present

## 2014-07-10 DIAGNOSIS — Z8669 Personal history of other diseases of the nervous system and sense organs: Secondary | ICD-10-CM | POA: Diagnosis not present

## 2014-07-10 DIAGNOSIS — Y9389 Activity, other specified: Secondary | ICD-10-CM | POA: Diagnosis not present

## 2014-07-10 DIAGNOSIS — Y998 Other external cause status: Secondary | ICD-10-CM | POA: Insufficient documentation

## 2014-07-10 DIAGNOSIS — Z79899 Other long term (current) drug therapy: Secondary | ICD-10-CM | POA: Diagnosis not present

## 2014-07-10 DIAGNOSIS — W540XXA Bitten by dog, initial encounter: Secondary | ICD-10-CM | POA: Insufficient documentation

## 2014-07-10 MED ORDER — CLINDAMYCIN HCL 150 MG PO CAPS
300.0000 mg | ORAL_CAPSULE | Freq: Once | ORAL | Status: AC
  Administered 2014-07-10: 300 mg via ORAL
  Filled 2014-07-10: qty 2

## 2014-07-10 MED ORDER — TETANUS-DIPHTH-ACELL PERTUSSIS 5-2.5-18.5 LF-MCG/0.5 IM SUSP
0.5000 mL | Freq: Once | INTRAMUSCULAR | Status: AC
  Administered 2014-07-10: 0.5 mL via INTRAMUSCULAR
  Filled 2014-07-10: qty 0.5

## 2014-07-10 MED ORDER — CLINDAMYCIN HCL 300 MG PO CAPS: 300.0000 mg | ORAL_CAPSULE | Freq: Three times a day (TID) | ORAL | Status: DC

## 2014-07-10 NOTE — ED Notes (Signed)
Pt was bitten by her own dog, stating the dog is UTD on shots. Laceration to base of third toe on right foot.

## 2014-07-10 NOTE — ED Provider Notes (Signed)
CSN: [id number]     Arrival date & time 07/10/14  0913 History  This chart was scribed for Dorie Rank, MD by Starleen Arms, ED Scribe. This patient was seen in room APA05/APA05 and the patient's care was started at 9:33 AM.   Chief Complaint  Patient presents with  . Animal Bite   The history is provided by the patient. No language interpreter was used.   HPI Comments: Mary Dominguez is a 49 y.o. female who presents to the Emergency Department complaining of a dog bite on the right foot onset this morning.  The patient reports she was trying to break up a fight between her dog and another known dog, and her foot was bitten.  Both dogs vaccines are UTD.  Patients tetanus not UTD.  Past Medical History  Diagnosis Date  . Former smoker   . Insomnia    Past Surgical History  Procedure Laterality Date  . Appendectomy    . Oophorectomy      One in 2000  . Abdominal hysterectomy      menorrhagia   Family History  Problem Relation Age of Onset  . Cancer Neg Hx     no breast, ovarian, or colon cancer   History  Substance Use Topics  . Smoking status: Current Every Day Smoker -- 0.75 packs/day    Types: Cigarettes    Last Attempt to Quit: 02/09/2012  . Smokeless tobacco: Not on file  . Alcohol Use: Yes     Comment: twice a week   OB History    No data available     Review of Systems  Skin: Positive for wound.  All other systems reviewed and are negative.     Allergies  Penicillins  Home Medications   Prior to Admission medications   Medication Sig Start Date End Date Taking? Authorizing Provider  ALPRAZolam Duanne Moron) 0.5 MG tablet TAKE 1 TABLET BY MOUTH EVERY DAY AS NEEDED 05/19/14   Susy Frizzle, MD  estrogens, conjugated, (PREMARIN) 0.625 MG tablet Take 0.625 mg by mouth daily. 04/29/13   Susy Frizzle, MD  omeprazole (PRILOSEC) 40 MG capsule TAKE 1 CAPSULE (40 MG TOTAL) BY MOUTH DAILY. 12/27/13   Susy Frizzle, MD  PREMARIN 0.625 MG tablet TAKE 1 TABLET (0.625  MG TOTAL) BY MOUTH DAILY. 05/20/14   Susy Frizzle, MD  trimethoprim-polymyxin b (POLYTRIM) ophthalmic solution Place 2 drops into the right eye every 4 (four) hours. 05/05/14   Susy Frizzle, MD   BP 140/85 mmHg  Pulse 88  Temp(Src) 97.5 F (36.4 C) (Oral)  Resp 18  SpO2 93% Physical Exam  Constitutional: She appears well-developed and well-nourished. No distress.  HENT:  Head: Normocephalic and atraumatic.  Right Ear: External ear normal.  Left Ear: External ear normal.  Eyes: Conjunctivae are normal. Right eye exhibits no discharge. Left eye exhibits no discharge. No scleral icterus.  Neck: Neck supple. No tracheal deviation present.  Cardiovascular: Normal rate.   Pulmonary/Chest: Effort normal. No stridor. No respiratory distress.  Musculoskeletal: She exhibits no edema.  Neurological: She is alert. Cranial nerve deficit: no gross deficits.  Skin: Skin is warm and dry. No rash noted.  Wedge-shaped laceration at the base of the 3rd and 4th toes on the volar aspect of the right foot.   Psychiatric: She has a normal mood and affect.  Nursing note and vitals reviewed.   ED Course  LACERATION REPAIR Date/Time: 07/10/2014 10:45 AM Performed by: Dorie Rank Authorized by:  Zavannah Deblois Irrigation method: jet lavage Amount of cleaning: standard Debridement: minimal Skin closure: Steri-Strips Approximation difficulty: simple Comments: Post op shoe and dressing   (including critical care time)  DIAGNOSTIC STUDIES: Oxygen Saturation is 93% on RA, adequate by my interpretation.    COORDINATION OF CARE:  9:38 AM Discussed treatment plan with patient at bedside.  Patient acknowledges and agrees with plan.     Imaging Review Dg Foot Complete Right  07/10/2014   CLINICAL DATA:  Dog bite.  Puncture site at fourth toe area.  EXAM: RIGHT FOOT COMPLETE - 3+ VIEW  COMPARISON:  None.  FINDINGS: Negative for fracture or dislocation. Alignment of the foot is normal. No evidence for a  radiopaque foreign body. Soft tissues are grossly unremarkable.  IMPRESSION: No acute bone abnormality to the right foot.   Electronically Signed   By: Markus Daft M.D.   On: 07/10/2014 10:20      MDM   Final diagnoses:  Dog bite    Discussed treatment and concern regarding suturing the bite wound.  Wound was copiously irrigated.  Debrided to remove the few hairs noted within the wound.  Steristrips applied.  DC home with abx.  Monitor for signs of infection.  I personally performed the services described in this documentation, which was scribed in my presence.  The recorded information has been reviewed and is accurate.    Dorie Rank, MD 07/10/14 1046

## 2014-07-10 NOTE — ED Notes (Signed)
MD at bedside. 

## 2014-07-10 NOTE — Discharge Instructions (Signed)

## 2014-07-26 ENCOUNTER — Other Ambulatory Visit: Payer: Self-pay

## 2014-07-26 DIAGNOSIS — Z1231 Encounter for screening mammogram for malignant neoplasm of breast: Secondary | ICD-10-CM

## 2014-08-12 ENCOUNTER — Ambulatory Visit
Admission: RE | Admit: 2014-08-12 | Discharge: 2014-08-12 | Disposition: A | Discharge status: Home / Self Care | Payer: BLUE CROSS/BLUE SHIELD | Source: Ambulatory Visit

## 2014-08-12 DIAGNOSIS — Z1231 Encounter for screening mammogram for malignant neoplasm of breast: Secondary | ICD-10-CM

## 2014-08-18 ENCOUNTER — Other Ambulatory Visit: Payer: Self-pay | Admitting: Family Medicine

## 2014-08-18 NOTE — Telephone Encounter (Signed)
Medication called to pharmacy. 

## 2014-08-18 NOTE — Telephone Encounter (Signed)
ok 

## 2014-08-18 NOTE — Telephone Encounter (Signed)
Ok to refill??  Last office visit 05/05/2014.  Last refill 05/19/2014, #2 refills.

## 2014-11-18 ENCOUNTER — Other Ambulatory Visit: Payer: Self-pay | Admitting: Family Medicine

## 2014-11-18 NOTE — Telephone Encounter (Signed)
ok 

## 2014-11-18 NOTE — Telephone Encounter (Signed)
?   OK to Refill  

## 2014-12-18 ENCOUNTER — Other Ambulatory Visit: Payer: Self-pay | Admitting: Family Medicine

## 2015-01-06 ENCOUNTER — Encounter: Payer: Self-pay | Admitting: Family Medicine

## 2015-01-06 ENCOUNTER — Ambulatory Visit: Payer: BLUE CROSS/BLUE SHIELD | Admitting: Family Medicine

## 2015-01-06 ENCOUNTER — Ambulatory Visit (INDEPENDENT_AMBULATORY_CARE_PROVIDER_SITE_OTHER): Payer: BLUE CROSS/BLUE SHIELD | Admitting: Family Medicine

## 2015-01-06 VITALS — BP 136/84 | HR 80 | Temp 98.1°F | Resp 16 | Ht 67.0 in | Wt 162.0 lb

## 2015-01-06 DIAGNOSIS — F411 Generalized anxiety disorder: Secondary | ICD-10-CM

## 2015-01-06 MED ORDER — ESTROGENS CONJUGATED 0.625 MG PO TABS: ORAL_TABLET | ORAL | Status: DC

## 2015-01-06 MED ORDER — ESCITALOPRAM OXALATE 10 MG PO TABS: 10.0000 mg | ORAL_TABLET | Freq: Every day | ORAL | Status: DC

## 2015-01-06 NOTE — Progress Notes (Signed)
Subjective:    Patient ID: Mary Dominguez, female    DOB: February 15, 1965, 49 y.o.   MRN: [id number]  HPI 09/07/13 Patient was recently admitted to the hospital with substernal chest pain it radiated into her left child. At that time she underwent a stress test/Myoview that was normal. She also had a chest x-ray that was normal. A 2-D echocardiogram was also normal. Troponins were normal. The patient was diagnosed with noncardiac chest pain and discharged home. She's been recommended stress recently with her job. She is constantly worrying about work. She's also been having episodes of lightheadedness which sounded like presyncope and dizziness. She denies a true syncope. She has been having occasional rapid heartbeats. This is usually associated with lightheadedness and is also associated with stress. They resolve spontaneously on their own.  She denies any shortness of breath or dyspnea on exertion. She is taking estrogens. D-dimer was negative for PE.  At that time, my plan was: I believe the patient's chest pain was likely due to gastroesophageal reflux. We'll start her on omeprazole 40 mg by mouth daily. If pain continues, I would recommend seeing a GI doctor for an EGD. I believe the patient's presyncope as likely vasovagal and related to stress and anxiety stemming from work. I believe the patient is experiencing tachycardia due to anxiety. I believe she is having mild panic attacks. I did recommend having the patient where an event monitor to rule out cardiac arrhythmias which she declined. She too believes this is stressed. She like to make some changes in her life to reduce her stress load and see if symptoms improve.  Recheck in 2 weeks or immediately if worse.  01/06/15  ultimately, the patient tried to manage her symptoms by decreasing the stress in her life which seemed to help. She also went on Premarin 0.625 mg for hot flashes which also seemed to help. She been doing well with this until the  last several months. Now the symptoms have returned more severe than they were previously. She has hot flashes every night. However they also occur during the daytime. She lso feels extremely anxious. She reported numbness and tingling all over her body associated with shortness of breath and trouble breathing and heaviness in her chest. The symptoms all occurred simultaneously. Symptoms to me some more like panic attacks than hot flashes. Patient states she becomes extremely nervous around coworkers. She has a difficult time even concentrating on working when she gets one of these attacks which she associates with hot flashes but I feel are more likely anxiety attacks. Patient states she can no all her function. She lives each day in fear of the next attack. Past Medical History  Diagnosis Date  . Former smoker   . Insomnia    Past Surgical History  Procedure Laterality Date  . Appendectomy    . Oophorectomy      One in 2000  . Abdominal hysterectomy      menorrhagia   Current Outpatient Prescriptions on File Prior to Visit  Medication Sig Dispense Refill  . ALPRAZolam (XANAX) 0.5 MG tablet TAKE 1 TABLET BY MOUTH EVERY DAY AS NEEDED FOR ANXIETY 30 tablet 2  . clindamycin (CLEOCIN) 300 MG capsule Take 1 capsule (300 mg total) by mouth 3 (three) times daily. 15 capsule 0  . omeprazole (PRILOSEC) 40 MG capsule TAKE 1 CAPSULE (40 MG TOTAL) BY MOUTH DAILY. 30 capsule 11  . trimethoprim-polymyxin b (POLYTRIM) ophthalmic solution Place 2 drops into the right  eye every 4 (four) hours. 10 mL 0   No current facility-administered medications on file prior to visit.   Allergies  Allergen Reactions  . Penicillins     Childhood reaction   Social History   Social History  . Marital Status: Married    Spouse Name: N/A  . Number of Children: N/A  . Years of Education: N/A   Occupational History  . Not on file.   Social History Main Topics  . Smoking status: Current Every Day Smoker -- 0.75  packs/day    Types: Cigarettes    Last Attempt to Quit: 02/09/2012  . Smokeless tobacco: Not on file  . Alcohol Use: Yes     Comment: twice a week  . Drug Use: No  . Sexual Activity: Yes   Other Topics Concern  . Not on file   Social History Narrative       Review of Systems  All other systems reviewed and are negative.      Objective:   Physical Exam  Constitutional: She appears well-developed and well-nourished.  Neck: Neck supple. No JVD present.  Cardiovascular: Normal rate, regular rhythm and normal heart sounds.  Exam reveals no gallop and no friction rub.   No murmur heard. Pulmonary/Chest: Effort normal and breath sounds normal. No respiratory distress. She has no wheezes. She has no rales.  Abdominal: Soft. Bowel sounds are normal. She exhibits no distension. There is no tenderness. There is no rebound and no guarding.  Musculoskeletal: Normal range of motion. She exhibits no edema.  Lymphadenopathy:    She has no cervical adenopathy.  Vitals reviewed.         Assessment & Plan:  GAD (generalized anxiety disorder)   I feel that she's having anxiety attacks more than hot flashes. We can certainly increase the premarin to 1.25 mg poqday for hot flashes however I recommended Lexapro 10 mg by mouth daily for generalized anxiety disorder and panic attacks. I will see the patient back in 4 weeks or immediately if worse to reassess her symptoms.

## 2015-01-17 ENCOUNTER — Encounter: Payer: Self-pay | Admitting: Family Medicine

## 2015-01-17 ENCOUNTER — Ambulatory Visit (INDEPENDENT_AMBULATORY_CARE_PROVIDER_SITE_OTHER): Payer: BLUE CROSS/BLUE SHIELD | Admitting: Family Medicine

## 2015-01-17 ENCOUNTER — Other Ambulatory Visit: Payer: Self-pay | Admitting: Family Medicine

## 2015-01-17 VITALS — BP 134/80 | HR 110 | Temp 98.2°F | Resp 20 | Ht 67.0 in | Wt 162.0 lb

## 2015-01-17 DIAGNOSIS — J208 Acute bronchitis due to other specified organisms: Secondary | ICD-10-CM

## 2015-01-17 DIAGNOSIS — R509 Fever, unspecified: Secondary | ICD-10-CM | POA: Diagnosis not present

## 2015-01-17 LAB — INFLUENZA A AND B AG, IMMUNOASSAY
Influenza A Antigen: NOT DETECTED
Influenza B Antigen: NOT DETECTED

## 2015-01-17 MED ORDER — HYDROCODONE-HOMATROPINE 5-1.5 MG/5ML PO SYRP: 5.0000 mL | ORAL_SOLUTION | Freq: Three times a day (TID) | ORAL | Status: DC | PRN

## 2015-01-17 MED ORDER — LEVOFLOXACIN 500 MG PO TABS: 500.0000 mg | ORAL_TABLET | Freq: Every day | ORAL | Status: DC

## 2015-01-17 NOTE — Progress Notes (Signed)
Subjective:    Patient ID: Mary Dominguez, female    DOB: 05-Jun-1965, 50 y.o.   MRN: [id number]  HPI 09/07/13 Patient was recently admitted to the hospital with substernal chest pain it radiated into her left child. At that time she underwent a stress test/Myoview that was normal. She also had a chest x-ray that was normal. A 2-D echocardiogram was also normal. Troponins were normal. The patient was diagnosed with noncardiac chest pain and discharged home. She's been recommended stress recently with her job. She is constantly worrying about work. She's also been having episodes of lightheadedness which sounded like presyncope and dizziness. She denies a true syncope. She has been having occasional rapid heartbeats. This is usually associated with lightheadedness and is also associated with stress. They resolve spontaneously on their own.  She denies any shortness of breath or dyspnea on exertion. She is taking estrogens. D-dimer was negative for PE.  At that time, my plan was: I believe the patient's chest pain was likely due to gastroesophageal reflux. We'll start her on omeprazole 40 mg by mouth daily. If pain continues, I would recommend seeing a GI doctor for an EGD. I believe the patient's presyncope as likely vasovagal and related to stress and anxiety stemming from work. I believe the patient is experiencing tachycardia due to anxiety. I believe she is having mild panic attacks. I did recommend having the patient where an event monitor to rule out cardiac arrhythmias which she declined. She too believes this is stressed. She like to make some changes in her life to reduce her stress load and see if symptoms improve.  Recheck in 2 weeks or immediately if worse.  01/06/15 Ultimately, the patient tried to manage her symptoms by decreasing the stress in her life which seemed to help. She also went on Premarin 0.625 mg for hot flashes which also seemed to help. She been doing well with this until the  last several months. Now the symptoms have returned more severe than they were previously. She has hot flashes every night. However they also occur during the daytime. She lso feels extremely anxious. She reported numbness and tingling all over her body associated with shortness of breath and trouble breathing and heaviness in her chest. The symptoms all occurred simultaneously. Symptoms to me some more like panic attacks than hot flashes. Patient states she becomes extremely nervous around coworkers. She has a difficult time even concentrating on working when she gets one of these attacks which she associates with hot flashes but I feel are more likely anxiety attacks. Patient states she can no all her function. She lives each day in fear of the next attack.  At that time, my plan was:  I feel that she's having anxiety attacks more than hot flashes. We can certainly increase the premarin to 1.25 mg poqday for hot flashes however I recommended Lexapro 10 mg by mouth daily for generalized anxiety disorder and panic attacks. I will see the patient back in 4 weeks or immediately if worse to reassess her symptoms.  01/17/15 Patient has had a terrible cough for more than a week. Cough is productive of brown purulent sputum. She also has a fever to 101. She reports pleurisy and shortness of breath. On examination today she has faint expiratory wheezes in all 4 lung fields. She also has rhonchorous breath sounds. However the remainder of her examination is normal.  She is unable to stop coughing throughout the duration of the exam. Past Medical  History  Diagnosis Date  . Former smoker   . Insomnia    Past Surgical History  Procedure Laterality Date  . Appendectomy    . Oophorectomy      One in 2000  . Abdominal hysterectomy      menorrhagia   Current Outpatient Prescriptions on File Prior to Visit  Medication Sig Dispense Refill  . ALPRAZolam (XANAX) 0.5 MG tablet TAKE 1 TABLET BY MOUTH EVERY DAY AS  NEEDED FOR ANXIETY 30 tablet 2  . escitalopram (LEXAPRO) 10 MG tablet Take 1 tablet (10 mg total) by mouth daily. 30 tablet 5  . estrogens, conjugated, (PREMARIN) 0.625 MG tablet TAKE 2TABLET (1.25 MG TOTAL) BY MOUTH DAILY. 60 tablet 11  . omeprazole (PRILOSEC) 40 MG capsule TAKE 1 CAPSULE (40 MG TOTAL) BY MOUTH DAILY. 30 capsule 11  . trimethoprim-polymyxin b (POLYTRIM) ophthalmic solution Place 2 drops into the right eye every 4 (four) hours. 10 mL 0   No current facility-administered medications on file prior to visit.   Allergies  Allergen Reactions  . Penicillins     Childhood reaction   Social History   Social History  . Marital Status: Married    Spouse Name: N/A  . Number of Children: N/A  . Years of Education: N/A   Occupational History  . Not on file.   Social History Main Topics  . Smoking status: Current Every Day Smoker -- 0.75 packs/day    Types: Cigarettes    Last Attempt to Quit: 02/09/2012  . Smokeless tobacco: Not on file  . Alcohol Use: Yes     Comment: twice a week  . Drug Use: No  . Sexual Activity: Yes   Other Topics Concern  . Not on file   Social History Narrative       Review of Systems  All other systems reviewed and are negative.      Objective:   Physical Exam  Constitutional: She appears well-developed and well-nourished. She has a sickly appearance.  Neck: Neck supple. No JVD present.  Cardiovascular: Normal rate, regular rhythm and normal heart sounds.  Exam reveals no gallop and no friction rub.   No murmur heard. Pulmonary/Chest: Effort normal. No respiratory distress. She has wheezes. She has rhonchi. She has no rales.  Abdominal: Soft. Bowel sounds are normal. She exhibits no distension. There is no tenderness. There is no rebound and no guarding.  Musculoskeletal: Normal range of motion. She exhibits no edema.  Lymphadenopathy:    She has no cervical adenopathy.  Vitals reviewed.   Has a terrible, constant cough.         Assessment & Plan:  Other specified fever - Plan: Influenza a and b  Acute bronchitis due to other specified organisms - Plan: levofloxacin (LEVAQUIN) 500 MG tablet, HYDROcodone-homatropine (HYCODAN) 5-1.5 MG/5ML syrup  The patient's flu test is negative. She appears ill. I'm convinced that she has bacterial bronchitis versus walking pneumonia. I will start the patient on Levaquin 500 milligrams by mouth daily for 7 days and Hycodan 1 teaspoon every 6-8 hours as needed. Recheck in 48 hours if no better or immediately if worse

## 2015-01-19 ENCOUNTER — Telehealth: Payer: Self-pay | Admitting: Family Medicine

## 2015-01-19 ENCOUNTER — Encounter: Payer: Self-pay | Admitting: Family Medicine

## 2015-01-19 NOTE — Telephone Encounter (Signed)
Ok with note. 

## 2015-01-19 NOTE — Telephone Encounter (Signed)
Note printed - signed and left up front and pt aware to pick up

## 2015-01-19 NOTE — Telephone Encounter (Signed)
Patient is calling to say she is not better from her last visit, she has a trip scheduled for work with a plane ride and does not feel she is well enough to do this, would like to know if she can get a note to be excused from going on the business trip  Please call her at [phone]

## 2015-01-20 ENCOUNTER — Telehealth: Payer: Self-pay | Admitting: Family Medicine

## 2015-01-20 NOTE — Telephone Encounter (Signed)
Started new meds from illness on Tuesday.  Now developing rash on stomach and upper legs, back and groin area. Question is it from antibiotic of cough med, both new.

## 2015-01-23 MED ORDER — AZITHROMYCIN 250 MG PO TABS: ORAL_TABLET | ORAL | Status: DC

## 2015-01-23 NOTE — Telephone Encounter (Signed)
i am sorry for delay in response.  If rash still present, likely abx.  Dc levaqin and switch to zpack

## 2015-01-23 NOTE — Telephone Encounter (Signed)
Did apologize to patient for the delayed response.  She has not taken any further Levaquin.  Rx for Z-pack to pharmacy.

## 2015-02-09 ENCOUNTER — Ambulatory Visit (INDEPENDENT_AMBULATORY_CARE_PROVIDER_SITE_OTHER): Payer: BLUE CROSS/BLUE SHIELD | Admitting: Family Medicine

## 2015-02-09 ENCOUNTER — Encounter: Payer: Self-pay | Admitting: Family Medicine

## 2015-02-09 VITALS — BP 120/78 | HR 80 | Temp 98.8°F | Resp 18 | Wt 158.0 lb

## 2015-02-09 DIAGNOSIS — F411 Generalized anxiety disorder: Secondary | ICD-10-CM

## 2015-02-09 NOTE — Progress Notes (Signed)
Subjective:    Patient ID: Mary Dominguez, female    DOB: November 11, 1965, 50 y.o.   MRN: [id number]  HPI 09/07/13 Patient was recently admitted to the hospital with substernal chest pain it radiated into her left child. At that time she underwent a stress test/Myoview that was normal. She also had a chest x-ray that was normal. A 2-D echocardiogram was also normal. Troponins were normal. The patient was diagnosed with noncardiac chest pain and discharged home. She's been recommended stress recently with her job. She is constantly worrying about work. She's also been having episodes of lightheadedness which sounded like presyncope and dizziness. She denies a true syncope. She has been having occasional rapid heartbeats. This is usually associated with lightheadedness and is also associated with stress. They resolve spontaneously on their own.  She denies any shortness of breath or dyspnea on exertion. She is taking estrogens. D-dimer was negative for PE.  At that time, my plan was: I believe the patient's chest pain was likely due to gastroesophageal reflux. We'll start her on omeprazole 40 mg by mouth daily. If pain continues, I would recommend seeing a GI doctor for an EGD. I believe the patient's presyncope as likely vasovagal and related to stress and anxiety stemming from work. I believe the patient is experiencing tachycardia due to anxiety. I believe she is having mild panic attacks. I did recommend having the patient where an event monitor to rule out cardiac arrhythmias which she declined. She too believes this is stressed. She like to make some changes in her life to reduce her stress load and see if symptoms improve.  Recheck in 2 weeks or immediately if worse.  01/06/15 Ultimately, the patient tried to manage her symptoms by decreasing the stress in her life which seemed to help. She also went on Premarin 0.625 mg for hot flashes which also seemed to help. She been doing well with this until the  last several months. Now the symptoms have returned more severe than they were previously. She has hot flashes every night. However they also occur during the daytime. She lso feels extremely anxious. She reported numbness and tingling all over her body associated with shortness of breath and trouble breathing and heaviness in her chest. The symptoms all occurred simultaneously. Symptoms to me some more like panic attacks than hot flashes. Patient states she becomes extremely nervous around coworkers. She has a difficult time even concentrating on working when she gets one of these attacks which she associates with hot flashes but I feel are more likely anxiety attacks. Patient states she can no all her function. She lives each day in fear of the next attack.  At that time, my plan was:  I feel that she's having anxiety attacks more than hot flashes. We can certainly increase the premarin to 1.25 mg poqday for hot flashes however I recommended Lexapro 10 mg by mouth daily for generalized anxiety disorder and panic attacks. I will see the patient back in 4 weeks or immediately if worse to reassess her symptoms.  02/09/15 The patient never started taking Lexapro.  She was afraid of taking medication that she could become dependent 2 and half to wean off of. Therefore she increase the estrogen which helped tremendously with hot flashes. However she is now on high-dose estrogen and she is a smoker. I have clearly explained to the patient that this puts her at much higher risk of DVT and stroke not to mention the risk of breast  cancer. However she is willing to accept these risk due to how poorly she felt on a low-dose of estrogen. She also started taking an over-the-counter "serotonin" agonist. She believes this is helping and she has had no further panic attacks since. Overall she is doing extremely well. Past Medical History  Diagnosis Date  . Former smoker   . Insomnia    Past Surgical History  Procedure  Laterality Date  . Appendectomy    . Oophorectomy      One in 2000  . Abdominal hysterectomy      menorrhagia   Current Outpatient Prescriptions on File Prior to Visit  Medication Sig Dispense Refill  . ALPRAZolam (XANAX) 0.5 MG tablet TAKE 1 TABLET BY MOUTH EVERY DAY AS NEEDED FOR ANXIETY 30 tablet 2  . estrogens, conjugated, (PREMARIN) 0.625 MG tablet TAKE 2TABLET (1.25 MG TOTAL) BY MOUTH DAILY. 60 tablet 11  . omeprazole (PRILOSEC) 40 MG capsule TAKE 1 CAPSULE (40 MG TOTAL) BY MOUTH DAILY. 30 capsule 11  . escitalopram (LEXAPRO) 10 MG tablet Take 1 tablet (10 mg total) by mouth daily. (Patient not taking: Reported on 02/09/2015) 30 tablet 5   No current facility-administered medications on file prior to visit.   Allergies  Allergen Reactions  . Penicillins     Childhood reaction  . Levaquin [Levofloxacin In D5w] Rash   Social History   Social History  . Marital Status: Married    Spouse Name: N/A  . Number of Children: N/A  . Years of Education: N/A   Occupational History  . Not on file.   Social History Main Topics  . Smoking status: Current Every Day Smoker -- 0.75 packs/day    Types: Cigarettes    Last Attempt to Quit: 02/09/2012  . Smokeless tobacco: Not on file  . Alcohol Use: Yes     Comment: twice a week  . Drug Use: No  . Sexual Activity: Yes   Other Topics Concern  . Not on file   Social History Narrative       Review of Systems  All other systems reviewed and are negative.      Objective:   Physical Exam  Constitutional: She appears well-developed and well-nourished.  Neck: Neck supple. No JVD present.  Cardiovascular: Normal rate, regular rhythm and normal heart sounds.  Exam reveals no gallop and no friction rub.   No murmur heard. Pulmonary/Chest: Effort normal. No respiratory distress. She has no wheezes. She has no rhonchi. She has no rales.  Abdominal: Soft. Bowel sounds are normal. She exhibits no distension. There is no tenderness.  There is no rebound and no guarding.  Musculoskeletal: Normal range of motion. She exhibits no edema.  Lymphadenopathy:    She has no cervical adenopathy.  Vitals reviewed.         Assessment & Plan:  GAD (generalized anxiety disorder)  Patient's anxiety has improved. She does not want to take the Lexapro. She will continue her herbal serotonin agonist. However I would like her to try to start weaning down on estrogen due to the risk of side effects particularly in a smoker. I've also recommended smoking cessation. Patient will try black cohosh to help with hot flashes and wean down gradually on the estrogen.

## 2015-02-15 ENCOUNTER — Other Ambulatory Visit: Payer: Self-pay | Admitting: Family Medicine

## 2015-02-15 NOTE — Telephone Encounter (Signed)
?   OK to Refill  

## 2015-02-16 NOTE — Telephone Encounter (Signed)
ok 

## 2015-03-19 ENCOUNTER — Other Ambulatory Visit: Payer: Self-pay | Admitting: Family Medicine

## 2015-03-20 NOTE — Telephone Encounter (Signed)
?   OK to Refill  

## 2015-03-20 NOTE — Telephone Encounter (Signed)
Medication called to pharmacy. 

## 2015-03-20 NOTE — Telephone Encounter (Signed)
ok 

## 2015-04-18 ENCOUNTER — Other Ambulatory Visit: Payer: Self-pay

## 2015-04-18 DIAGNOSIS — Z1231 Encounter for screening mammogram for malignant neoplasm of breast: Secondary | ICD-10-CM

## 2015-04-21 ENCOUNTER — Other Ambulatory Visit: Payer: Self-pay | Admitting: Family Medicine

## 2015-04-24 NOTE — Telephone Encounter (Signed)
?   OK to Refill  

## 2015-04-24 NOTE — Telephone Encounter (Signed)
ok 

## 2015-05-12 ENCOUNTER — Ambulatory Visit (INDEPENDENT_AMBULATORY_CARE_PROVIDER_SITE_OTHER): Payer: BLUE CROSS/BLUE SHIELD | Admitting: Family Medicine

## 2015-05-12 ENCOUNTER — Encounter: Payer: Self-pay | Admitting: Family Medicine

## 2015-05-12 ENCOUNTER — Ambulatory Visit (HOSPITAL_COMMUNITY)
Admission: RE | Admit: 2015-05-12 | Discharge: 2015-05-12 | Disposition: A | Discharge status: Home / Self Care | Payer: BLUE CROSS/BLUE SHIELD | Source: Ambulatory Visit | Attending: Family Medicine | Admitting: Family Medicine

## 2015-05-12 VITALS — BP 148/90 | HR 84 | Temp 98.5°F | Resp 16 | Ht 67.0 in | Wt 156.0 lb

## 2015-05-12 DIAGNOSIS — Z0000 Encounter for general adult medical examination without abnormal findings: Secondary | ICD-10-CM | POA: Diagnosis not present

## 2015-05-12 DIAGNOSIS — R053 Chronic cough: Secondary | ICD-10-CM

## 2015-05-12 DIAGNOSIS — R05 Cough: Secondary | ICD-10-CM | POA: Diagnosis not present

## 2015-05-12 DIAGNOSIS — E785 Hyperlipidemia, unspecified: Secondary | ICD-10-CM

## 2015-05-12 DIAGNOSIS — F411 Generalized anxiety disorder: Secondary | ICD-10-CM

## 2015-05-12 LAB — CBC WITH DIFFERENTIAL/PLATELET
Basophils Absolute: 0 cells/uL (ref 0–200)
Basophils Relative: 0 %
Eosinophils Absolute: 182 cells/uL (ref 15–500)
Eosinophils Relative: 2 %
HCT: 48.3 % — ABNORMAL HIGH (ref 35.0–45.0)
Hemoglobin: 16.4 g/dL — ABNORMAL HIGH (ref 12.0–15.0)
Lymphocytes Relative: 25 %
Lymphs Abs: 2275 cells/uL (ref 850–3900)
MCH: 31.6 pg (ref 27.0–33.0)
MCHC: 34 g/dL (ref 32.0–36.0)
MCV: 93.1 fL (ref 80.0–100.0)
MPV: 11.3 fL (ref 7.5–12.5)
Monocytes Absolute: 546 cells/uL (ref 200–950)
Monocytes Relative: 6 %
Neutro Abs: 6097 cells/uL (ref 1500–7800)
Neutrophils Relative %: 67 %
Platelets: 269 10*3/uL (ref 140–400)
RBC: 5.19 MIL/uL — ABNORMAL HIGH (ref 3.80–5.10)
RDW: 13.2 % (ref 11.0–15.0)
WBC: 9.1 10*3/uL (ref 3.8–10.8)

## 2015-05-12 LAB — COMPLETE METABOLIC PANEL WITH GFR
ALT: 19 U/L (ref 6–29)
AST: 19 U/L (ref 10–35)
Albumin: 4.4 g/dL (ref 3.6–5.1)
Alkaline Phosphatase: 67 U/L (ref 33–130)
BUN: 12 mg/dL (ref 7–25)
CO2: 27 mmol/L (ref 20–31)
Calcium: 9.4 mg/dL (ref 8.6–10.4)
Chloride: 100 mmol/L (ref 98–110)
Creat: 0.94 mg/dL (ref 0.50–1.05)
GFR, Est African American: 82 mL/min (ref >=60)
GFR, Est Non African American: 71 mL/min (ref >=60)
Glucose, Bld: 91 mg/dL (ref 70–99)
Potassium: 4.7 mmol/L (ref 3.5–5.3)
Sodium: 138 mmol/L (ref 135–146)
Total Bilirubin: 0.3 mg/dL (ref 0.2–1.2)
Total Protein: 7.1 g/dL (ref 6.1–8.1)

## 2015-05-12 LAB — LIPID PANEL
Cholesterol: 241 mg/dL — ABNORMAL HIGH (ref 125–200)
HDL: 50 mg/dL (ref >=46)
LDL Cholesterol: 160 mg/dL — ABNORMAL HIGH (ref <130)
Total CHOL/HDL Ratio: 4.8 Ratio (ref <=5.0)
Triglycerides: 155 mg/dL — ABNORMAL HIGH (ref <150)
VLDL: 31 mg/dL — ABNORMAL HIGH (ref <30)

## 2015-05-12 NOTE — Progress Notes (Signed)
Subjective:    Patient ID: Mary Dominguez, female    DOB: [birth year], 50 y.o.   MRN: [id number]  HPI Patient is a very pleasant 50 year old white female who is here today for complete physical exam. Past medical history is significant for hysterectomy that was done for noncancerous reasons. Therefore she does not require a screening Pap smear. She is due for colonoscopy and she would like me to schedule at. Her mammogram is not due until August. Her last fasting lab work revealed significant elevations in her LDL cholesterol. She is tried to address that with diet. We are due to recheck that today. Her blood pressure significantly elevated today. She still smokes. She also had an episode of bronchitis earlier this year that has never resolved. She has a persistent cough that will not subside. While possibly due to smoking, I believe is certainly reasonable to check a chest x-ray for precautionary reasons. She denies any hemoptysis. Immunizations are up-to-date. She also reports snoring. Her husband has heard her stop breathing at times. She also reports hypersomnolence. However at the present time she is not interested in a sleep study. She would like to try changing her mattress first to see if it makes positive impact Past Medical History  Diagnosis Date  . Former smoker   . Insomnia    Past Surgical History  Procedure Laterality Date  . Appendectomy    . Oophorectomy      One in 2000  . Abdominal hysterectomy      menorrhagia   Current Outpatient Prescriptions on File Prior to Visit  Medication Sig Dispense Refill  . ALPRAZolam (XANAX) 0.5 MG tablet TAKE 1 TABLET EVERY DAY AS NEEDED 30 tablet 2  . estrogens, conjugated, (PREMARIN) 0.625 MG tablet TAKE 2TABLET (1.25 MG TOTAL) BY MOUTH DAILY. 60 tablet 11  . Ginkgo Biloba 40 MG TABS Take by mouth.    . Ginseng 100 MG CAPS Take by mouth.    Marland Kitchen omeprazole (PRILOSEC) 40 MG capsule TAKE 1 CAPSULE (40 MG TOTAL) BY MOUTH DAILY. 30 capsule 11    No current facility-administered medications on file prior to visit.   Allergies  Allergen Reactions  . Penicillins     Childhood reaction  . Levaquin [Levofloxacin In D5w] Rash   Social History   Social History  . Marital Status: Married    Spouse Name: N/A  . Number of Children: N/A  . Years of Education: N/A   Occupational History  . Not on file.   Social History Main Topics  . Smoking status: Current Every Day Smoker -- 0.75 packs/day    Types: Cigarettes    Last Attempt to Quit: 02/09/2012  . Smokeless tobacco: Not on file  . Alcohol Use: Yes     Comment: twice a week  . Drug Use: No  . Sexual Activity: Yes   Other Topics Concern  . Not on file   Social History Narrative   Family History  Problem Relation Age of Onset  . Cancer Neg Hx     no breast, ovarian, or colon cancer     Review of Systems  All other systems reviewed and are negative.      Objective:   Physical Exam  Constitutional: She is oriented to person, place, and time. She appears well-developed and well-nourished. No distress.  HENT:  Head: Normocephalic and atraumatic.  Right Ear: External ear normal.  Left Ear: External ear normal.  Nose: Nose normal.  Mouth/Throat: Oropharynx is clear and  moist. No oropharyngeal exudate.  Eyes: Conjunctivae and EOM are normal. Pupils are equal, round, and reactive to light. Right eye exhibits no discharge. Left eye exhibits no discharge. No scleral icterus.  Neck: Normal range of motion. Neck supple. No JVD present. No tracheal deviation present. No thyromegaly present.  Cardiovascular: Normal rate, regular rhythm, normal heart sounds and intact distal pulses.  Exam reveals no gallop and no friction rub.   No murmur heard. Pulmonary/Chest: Effort normal and breath sounds normal. No stridor. No respiratory distress. She has no wheezes. She has no rales. She exhibits no tenderness.  Abdominal: Soft. Bowel sounds are normal. She exhibits no distension  and no mass. There is no tenderness. There is no rebound and no guarding.  Musculoskeletal: Normal range of motion. She exhibits no edema or tenderness.  Lymphadenopathy:    She has no cervical adenopathy.  Neurological: She is alert and oriented to person, place, and time. She has normal reflexes. She displays normal reflexes. No cranial nerve deficit. She exhibits normal muscle tone. Coordination normal.  Skin: Skin is warm. No rash noted. She is not diaphoretic. No erythema. No pallor.  Psychiatric: She has a normal mood and affect. Her behavior is normal. Judgment and thought content normal.  Vitals reviewed.         Assessment & Plan:  Routine general medical examination at a health care facility - Plan: CBC with Differential/Platelet, COMPLETE METABOLIC PANEL WITH GFR, Lipid panel  GAD (generalized anxiety disorder)  HLD (hyperlipidemia)  Chronic cough - Plan: DG Chest 2 View  Her physical exam is significant for elevated blood pressure. I would like the patient to check her blood pressure frequently at home over the next 1-2 weeks and then notify me of the values. If blood pressures consistently greater than 140/90, I would recommend medication to bring this down. I also like to check a fasting lipid panel. Because of her age, blood pressure, smoking, my goal LDL cholesterol be less than 100. If not less than goal, I would recommend statin medication. I also recommended smoking cessation but the patient is in the pre-contemplative phase. Given her chronic cough I do believe is entirely reasonable to get a chest x-ray. She will go for that today. I will suggest a sleep study when convenient for her to evaluate for possible sleep apnea but she defers this at the present time. I will schedule her for colonoscopy.

## 2015-06-02 ENCOUNTER — Ambulatory Visit: Payer: BLUE CROSS/BLUE SHIELD | Admitting: Family Medicine

## 2015-06-09 ENCOUNTER — Telehealth: Payer: Self-pay | Admitting: Family Medicine

## 2015-06-09 NOTE — Telephone Encounter (Signed)
bp is excellent.

## 2015-06-09 NOTE — Telephone Encounter (Signed)
A nurse called and left a VM with pt's blood pressure readings. They are as follows: 135/79, 122/86, 111/79, 121/83, 126/83 No other information was left.

## 2015-06-09 NOTE — Telephone Encounter (Signed)
Call placed to patient and patient made aware per VM.  

## 2015-06-09 NOTE — Telephone Encounter (Signed)
MD please advise

## 2015-07-25 ENCOUNTER — Other Ambulatory Visit: Payer: Self-pay | Admitting: Family Medicine

## 2015-07-26 NOTE — Telephone Encounter (Signed)
Ok to refill 

## 2015-07-27 NOTE — Telephone Encounter (Signed)
ok 

## 2015-07-31 ENCOUNTER — Encounter: Payer: Self-pay | Admitting: Family Medicine

## 2015-09-25 ENCOUNTER — Other Ambulatory Visit: Payer: Self-pay | Admitting: Family Medicine

## 2015-09-25 DIAGNOSIS — Z1231 Encounter for screening mammogram for malignant neoplasm of breast: Secondary | ICD-10-CM

## 2015-10-03 ENCOUNTER — Ambulatory Visit
Admission: RE | Admit: 2015-10-03 | Discharge: 2015-10-03 | Disposition: A | Discharge status: Home / Self Care | Payer: BLUE CROSS/BLUE SHIELD | Source: Ambulatory Visit | Attending: Family Medicine | Admitting: Family Medicine

## 2015-10-03 DIAGNOSIS — Z1231 Encounter for screening mammogram for malignant neoplasm of breast: Secondary | ICD-10-CM | POA: Diagnosis not present

## 2015-10-09 ENCOUNTER — Other Ambulatory Visit: Payer: Self-pay | Admitting: Family Medicine

## 2015-10-09 MED ORDER — ESTROGENS CONJUGATED 0.625 MG PO TABS: ORAL_TABLET | ORAL | 11 refills | Status: DC

## 2015-10-09 NOTE — Telephone Encounter (Signed)
Medication called/sent to requested pharmacy  

## 2015-10-09 NOTE — Addendum Note (Signed)
Addended by: Shary Decamp B on: 10/09/2015 09:56 AM   Modules accepted: Orders

## 2015-10-11 ENCOUNTER — Other Ambulatory Visit: Payer: Self-pay | Admitting: Family Medicine

## 2015-10-11 MED ORDER — ESTROGENS CONJUGATED 0.625 MG PO TABS: ORAL_TABLET | ORAL | 11 refills | Status: DC

## 2015-10-11 NOTE — Telephone Encounter (Signed)
Medication called/sent to requested pharmacy  

## 2015-10-25 ENCOUNTER — Other Ambulatory Visit: Payer: Self-pay | Admitting: Family Medicine

## 2015-10-25 NOTE — Telephone Encounter (Signed)
Ok to refill 

## 2015-10-26 NOTE — Telephone Encounter (Signed)
ok 

## 2015-12-07 ENCOUNTER — Other Ambulatory Visit: Payer: Self-pay | Admitting: Family Medicine

## 2016-01-25 ENCOUNTER — Other Ambulatory Visit: Payer: Self-pay | Admitting: Family Medicine

## 2016-01-26 NOTE — Telephone Encounter (Signed)
LRF 10/26/15 #30 + 2   LOV 06/02/15  OK refill?

## 2016-01-26 NOTE — Telephone Encounter (Signed)
ok 

## 2016-04-17 ENCOUNTER — Other Ambulatory Visit: Payer: Self-pay | Admitting: Family Medicine

## 2016-04-17 MED ORDER — ESTROGENS CONJUGATED 0.625 MG PO TABS: ORAL_TABLET | ORAL | 3 refills | Status: DC

## 2016-04-25 ENCOUNTER — Other Ambulatory Visit: Payer: Self-pay | Admitting: Family Medicine

## 2016-04-25 MED ORDER — OMEPRAZOLE 40 MG PO CPDR: DELAYED_RELEASE_CAPSULE | ORAL | 3 refills | Status: DC

## 2016-04-25 NOTE — Telephone Encounter (Signed)
Medication called/sent to requested pharmacy  

## 2016-05-01 ENCOUNTER — Other Ambulatory Visit: Payer: Self-pay | Admitting: Family Medicine

## 2016-05-01 NOTE — Telephone Encounter (Signed)
Ok to refill 

## 2016-05-02 NOTE — Telephone Encounter (Signed)
Medication called to pharmacy. 

## 2016-05-02 NOTE — Telephone Encounter (Signed)
ok 

## 2016-05-13 ENCOUNTER — Encounter: Payer: BLUE CROSS/BLUE SHIELD | Admitting: Family Medicine

## 2016-07-18 ENCOUNTER — Encounter: Payer: Self-pay | Admitting: Family Medicine

## 2016-07-18 ENCOUNTER — Ambulatory Visit (INDEPENDENT_AMBULATORY_CARE_PROVIDER_SITE_OTHER): Payer: BLUE CROSS/BLUE SHIELD | Admitting: Family Medicine

## 2016-07-18 VITALS — BP 110/76 | HR 72 | Temp 98.5°F | Resp 14 | Ht 67.0 in | Wt 160.0 lb

## 2016-07-18 DIAGNOSIS — Z0000 Encounter for general adult medical examination without abnormal findings: Secondary | ICD-10-CM

## 2016-07-18 LAB — CBC WITH DIFFERENTIAL/PLATELET
Basophils Absolute: 0 cells/uL (ref 0–200)
Basophils Relative: 0 %
Eosinophils Absolute: 142 cells/uL (ref 15–500)
Eosinophils Relative: 1 %
HCT: 47.6 % — ABNORMAL HIGH (ref 35.0–45.0)
Hemoglobin: 16.1 g/dL — ABNORMAL HIGH (ref 12.0–15.0)
Lymphocytes Relative: 13 %
Lymphs Abs: 1846 cells/uL (ref 850–3900)
MCH: 32.7 pg (ref 27.0–33.0)
MCHC: 33.8 g/dL (ref 32.0–36.0)
MCV: 96.7 fL (ref 80.0–100.0)
MPV: 10.8 fL (ref 7.5–12.5)
Monocytes Absolute: 710 cells/uL (ref 200–950)
Monocytes Relative: 5 %
Neutro Abs: 11502 cells/uL — ABNORMAL HIGH (ref 1500–7800)
Neutrophils Relative %: 81 %
Platelets: 247 10*3/uL (ref 140–400)
RBC: 4.92 MIL/uL (ref 3.80–5.10)
RDW: 13 % (ref 11.0–15.0)
WBC: 14.2 10*3/uL — ABNORMAL HIGH (ref 3.8–10.8)

## 2016-07-18 LAB — COMPLETE METABOLIC PANEL WITH GFR
ALT: 19 U/L (ref 6–29)
AST: 18 U/L (ref 10–35)
Albumin: 4.1 g/dL (ref 3.6–5.1)
Alkaline Phosphatase: 61 U/L (ref 33–130)
BUN: 13 mg/dL (ref 7–25)
CO2: 26 mmol/L (ref 20–31)
Calcium: 9.4 mg/dL (ref 8.6–10.4)
Chloride: 101 mmol/L (ref 98–110)
Creat: 0.93 mg/dL (ref 0.50–1.05)
GFR, Est African American: 82 mL/min (ref >=60)
GFR, Est Non African American: 71 mL/min (ref >=60)
Glucose, Bld: 89 mg/dL (ref 70–99)
Potassium: 4.9 mmol/L (ref 3.5–5.3)
Sodium: 136 mmol/L (ref 135–146)
Total Bilirubin: 0.4 mg/dL (ref 0.2–1.2)
Total Protein: 6.9 g/dL (ref 6.1–8.1)

## 2016-07-18 LAB — LIPID PANEL
Cholesterol: 244 mg/dL — ABNORMAL HIGH (ref <200)
HDL: 41 mg/dL — ABNORMAL LOW (ref >50)
LDL Cholesterol: 140 mg/dL — ABNORMAL HIGH (ref <100)
Total CHOL/HDL Ratio: 6 Ratio — ABNORMAL HIGH (ref <5.0)
Triglycerides: 317 mg/dL — ABNORMAL HIGH (ref <150)
VLDL: 63 mg/dL — ABNORMAL HIGH (ref <30)

## 2016-07-18 NOTE — Progress Notes (Signed)
Subjective:    Patient ID: Mary Dominguez, female    DOB: [birth year], 51 y.o.   MRN: [id number]  HPI Patient is a very pleasant 51 year old white female who is here today for complete physical exam. Past medical history is significant for hysterectomy that was done for noncancerous reasons. Therefore she does not require a screening Pap smear. She continues to refuse to do a colonoscopy despite medical advice. She is interested in trying cologuard.  She continues to smoke. She has no desire to quit at the present time. Immunization History  Administered Date(s) Administered  . Tdap 07/10/2014    Past Medical History:  Diagnosis Date  . Former smoker   . Insomnia    Past Surgical History:  Procedure Laterality Date  . ABDOMINAL HYSTERECTOMY     menorrhagia  . APPENDECTOMY    . OOPHORECTOMY     One in 2000   Current Outpatient Prescriptions on File Prior to Visit  Medication Sig Dispense Refill  . ALPRAZolam (XANAX) 0.5 MG tablet TAKE 1 TABLET EVERY DAY AS NEEDED 30 tablet 2  . estrogens, conjugated, (PREMARIN) 0.625 MG tablet TAKE 2TABLET (1.25 MG TOTAL) BY MOUTH DAILY. 180 tablet 3  . omeprazole (PRILOSEC) 40 MG capsule TAKE 1 CAPSULE (40 MG TOTAL) BY MOUTH DAILY. 90 capsule 3   No current facility-administered medications on file prior to visit.    Allergies  Allergen Reactions  . Penicillins     Childhood reaction  . Levaquin [Levofloxacin In D5w] Rash   Social History   Social History  . Marital status: Married    Spouse name: N/A  . Number of children: N/A  . Years of education: N/A   Occupational History  . Not on file.   Social History Main Topics  . Smoking status: Current Every Day Smoker    Packs/day: 0.75    Types: Cigarettes    Last attempt to quit: 02/09/2012  . Smokeless tobacco: Never Used  . Alcohol use Yes     Comment: twice a week  . Drug use: No  . Sexual activity: Yes   Other Topics Concern  . Not on file   Social History Narrative    . No narrative on file   Family History  Problem Relation Age of Onset  . Cancer Neg Hx        no breast, ovarian, or colon cancer     Review of Systems  All other systems reviewed and are negative.      Objective:   Physical Exam  Constitutional: She is oriented to person, place, and time. She appears well-developed and well-nourished. No distress.  HENT:  Head: Normocephalic and atraumatic.  Right Ear: External ear normal.  Left Ear: External ear normal.  Nose: Nose normal.  Mouth/Throat: Oropharynx is clear and moist. No oropharyngeal exudate.  Eyes: Pupils are equal, round, and reactive to light. Conjunctivae and EOM are normal. Right eye exhibits no discharge. Left eye exhibits no discharge. No scleral icterus.  Neck: Normal range of motion. Neck supple. No JVD present. No tracheal deviation present. No thyromegaly present.  Cardiovascular: Normal rate, regular rhythm, normal heart sounds and intact distal pulses.  Exam reveals no gallop and no friction rub.   No murmur heard. Pulmonary/Chest: Effort normal and breath sounds normal. No stridor. No respiratory distress. She has no wheezes. She has no rales. She exhibits no tenderness.  Abdominal: Soft. Bowel sounds are normal. She exhibits no distension and no mass. There is no  tenderness. There is no rebound and no guarding.  Musculoskeletal: Normal range of motion. She exhibits no edema or tenderness.  Lymphadenopathy:    She has no cervical adenopathy.  Neurological: She is alert and oriented to person, place, and time. She has normal reflexes. No cranial nerve deficit. She exhibits normal muscle tone. Coordination normal.  Skin: Skin is warm. No rash noted. She is not diaphoretic. No erythema. No pallor.  Psychiatric: She has a normal mood and affect. Her behavior is normal. Judgment and thought content normal.  Vitals reviewed.         Assessment & Plan:  General medical exam - Plan: CBC with  Differential/Platelet, COMPLETE METABOLIC PANEL WITH GFR, Lipid panel  Her blood pressure is excellent today. Her mammogram is due in September. Pap smear is not required. She declines a colonoscopy but will consent to cologuard.  I will get a CBC, CMP, and fasting lipid panel. Continue to encourage smoking cessation. Recommended the patient try to wean herself away from Premarin and use black cohosh for hot flashes given the risk of breast cancer, stroke, and DVT particularly in a female patient who smokes on estrogen replacement.

## 2016-07-23 ENCOUNTER — Other Ambulatory Visit: Payer: Self-pay | Admitting: Family Medicine

## 2016-07-23 DIAGNOSIS — D72829 Elevated white blood cell count, unspecified: Secondary | ICD-10-CM

## 2016-07-23 MED ORDER — ATORVASTATIN CALCIUM 20 MG PO TABS: 20.0000 mg | ORAL_TABLET | Freq: Every day | ORAL | 3 refills | Status: DC

## 2016-07-29 ENCOUNTER — Other Ambulatory Visit: Payer: Self-pay | Admitting: Family Medicine

## 2016-07-29 NOTE — Telephone Encounter (Signed)
Ok to refill 

## 2016-07-29 NOTE — Telephone Encounter (Signed)
ok 

## 2016-08-28 ENCOUNTER — Other Ambulatory Visit: Payer: Self-pay | Admitting: Family Medicine

## 2016-08-28 DIAGNOSIS — Z1231 Encounter for screening mammogram for malignant neoplasm of breast: Secondary | ICD-10-CM

## 2016-10-03 ENCOUNTER — Ambulatory Visit: Payer: BLUE CROSS/BLUE SHIELD

## 2016-10-31 ENCOUNTER — Other Ambulatory Visit: Payer: Self-pay | Admitting: Family Medicine

## 2016-10-31 NOTE — Telephone Encounter (Signed)
Ok to refill 

## 2016-10-31 NOTE — Telephone Encounter (Signed)
ok 

## 2016-10-31 NOTE — Telephone Encounter (Signed)
Medication called to pharmacy. 

## 2017-02-01 ENCOUNTER — Other Ambulatory Visit: Payer: Self-pay | Admitting: Family Medicine

## 2017-02-03 NOTE — Telephone Encounter (Signed)
Requesting refill    Xanax  LOV: 07/18/16  LRF:  10/31/16

## 2017-04-30 ENCOUNTER — Other Ambulatory Visit: Payer: Self-pay | Admitting: Family Medicine

## 2017-05-08 ENCOUNTER — Other Ambulatory Visit: Payer: Self-pay | Admitting: Family Medicine

## 2017-05-08 NOTE — Telephone Encounter (Signed)
Pt is requesting refill on Xanax   LOV: 07/18/16.  LRF:   02/03/17

## 2017-06-11 ENCOUNTER — Other Ambulatory Visit: Payer: Self-pay | Admitting: Family Medicine

## 2017-08-11 ENCOUNTER — Encounter: Payer: Self-pay | Admitting: Family Medicine

## 2017-08-11 ENCOUNTER — Ambulatory Visit (INDEPENDENT_AMBULATORY_CARE_PROVIDER_SITE_OTHER): Payer: BLUE CROSS/BLUE SHIELD | Admitting: Family Medicine

## 2017-08-11 VITALS — BP 126/74 | HR 84 | Temp 97.9°F | Resp 16 | Ht 67.0 in | Wt 155.0 lb

## 2017-08-11 DIAGNOSIS — K439 Ventral hernia without obstruction or gangrene: Secondary | ICD-10-CM | POA: Diagnosis not present

## 2017-08-11 MED ORDER — VARENICLINE TARTRATE 0.5 MG X 11 & 1 MG X 42 PO MISC: ORAL | 0 refills | Status: DC

## 2017-08-11 MED ORDER — ALPRAZOLAM 0.5 MG PO TABS: 0.5000 mg | ORAL_TABLET | Freq: Every day | ORAL | 2 refills | Status: DC | PRN

## 2017-08-11 NOTE — Progress Notes (Signed)
Subjective:    Patient ID: Mary Dominguez, female    DOB: Mar 11, 1965, 52 y.o.   MRN: [id number]  HPI  Patient presents with 1 week of abdominal discomfort.  Patient states "I have an alien coming out of my belly".  Superior and to the left of her umbilicus is a palpable bulge that is worse with Valsalva, sit ups, and straining. It is 2-3 cm in size and not well circumscribed.  She has a diastasis recti midline but there is a bulge within that to the left and superior to the umbilicus that is new and also painful to palpation.  She denies any nausea or vomiting.  She denies any constipation, melena, hematochezia Past Medical History:  Diagnosis Date  . Former smoker   . Insomnia    Past Surgical History:  Procedure Laterality Date  . ABDOMINAL HYSTERECTOMY     menorrhagia  . APPENDECTOMY    . OOPHORECTOMY     One in 2000   Current Outpatient Medications on File Prior to Visit  Medication Sig Dispense Refill  . ALPRAZolam (XANAX) 0.5 MG tablet TAKE 1 TABLET BY MOUTH EVERY DAY AS NEEDED 30 tablet 2  . estrogens, conjugated, (PREMARIN) 0.625 MG tablet TAKE 2 TABLETS BY MOUTH EVERY DAY 180 tablet 3  . omeprazole (PRILOSEC) 40 MG capsule TAKE 1 CAPSULE BY MOUTH EVERY DAY 90 capsule 3   No current facility-administered medications on file prior to visit.    Allergies  Allergen Reactions  . Penicillins     Childhood reaction  . Levaquin [Levofloxacin In D5w] Rash   Social History   Socioeconomic History  . Marital status: Married    Spouse name: Not on file  . Number of children: Not on file  . Years of education: Not on file  . Highest education level: Not on file  Occupational History  . Not on file  Social Needs  . Financial resource strain: Not on file  . Food insecurity:    Worry: Not on file    Inability: Not on file  . Transportation needs:    Medical: Not on file    Non-medical: Not on file  Tobacco Use  . Smoking status: Current Every Day Smoker    Packs/day:  0.75    Types: Cigarettes    Last attempt to quit: 02/09/2012    Years since quitting: 5.5  . Smokeless tobacco: Never Used  Substance and Sexual Activity  . Alcohol use: Yes    Comment: twice a week  . Drug use: No  . Sexual activity: Yes  Lifestyle  . Physical activity:    Days per week: Not on file    Minutes per session: Not on file  . Stress: Not on file  Relationships  . Social connections:    Talks on phone: Not on file    Gets together: Not on file    Attends religious service: Not on file    Active member of club or organization: Not on file    Attends meetings of clubs or organizations: Not on file    Relationship status: Not on file  . Intimate partner violence:    Fear of current or ex partner: Not on file    Emotionally abused: Not on file    Physically abused: Not on file    Forced sexual activity: Not on file  Other Topics Concern  . Not on file  Social History Narrative  . Not on file  Review of Systems  All other systems reviewed and are negative.      Objective:   Physical Exam  Constitutional: She appears well-developed and well-nourished.  Neck: Neck supple. No JVD present.  Cardiovascular: Normal rate, regular rhythm and normal heart sounds. Exam reveals no gallop and no friction rub.  No murmur heard. Pulmonary/Chest: Effort normal. No respiratory distress. She has no wheezes. She has no rhonchi. She has no rales.  Abdominal: Soft. Bowel sounds are normal. She exhibits no distension. There is no tenderness. There is no rebound and no guarding. A hernia is present. Hernia confirmed positive in the ventral area.    Musculoskeletal: Normal range of motion. She exhibits no edema.  Lymphadenopathy:    She has no cervical adenopathy.  Vitals reviewed. Diagram shows diastasis recti in yellow with hernia diagrammed with red        Assessment & Plan:  Ventral hernia without obstruction or gangrene  Consult general surgery.  Discussed  options including clinical monitoring versus surgical correction and patient request surgical correction.  She also request a refill on her Xanax which she uses sparingly for anxiety and insomnia.  She is also requesting a starter pack on Chantix to help with smoking cessation which I will happily provide for her.  She is overdue for complete physical exam which she plans to schedule later.  We discussed colonoscopy and at the present time she declines and her insurance will not cover Cologuard

## 2017-08-18 DIAGNOSIS — R1013 Epigastric pain: Secondary | ICD-10-CM | POA: Diagnosis not present

## 2017-08-22 ENCOUNTER — Other Ambulatory Visit: Payer: Self-pay | Admitting: General Surgery

## 2017-08-22 DIAGNOSIS — R1013 Epigastric pain: Secondary | ICD-10-CM

## 2017-08-29 ENCOUNTER — Ambulatory Visit
Admission: RE | Admit: 2017-08-29 | Discharge: 2017-08-29 | Disposition: A | Discharge status: Home / Self Care | Payer: BLUE CROSS/BLUE SHIELD | Source: Ambulatory Visit | Attending: General Surgery | Admitting: General Surgery

## 2017-08-29 DIAGNOSIS — R1013 Epigastric pain: Secondary | ICD-10-CM

## 2017-08-29 DIAGNOSIS — K4090 Unilateral inguinal hernia, without obstruction or gangrene, not specified as recurrent: Secondary | ICD-10-CM | POA: Diagnosis not present

## 2017-08-29 MED ORDER — IOPAMIDOL (ISOVUE-300) INJECTION 61%
100.0000 mL | Freq: Once | INTRAVENOUS | Status: AC | PRN
  Administered 2017-08-29: 100 mL via INTRAVENOUS

## 2017-08-29 NOTE — Progress Notes (Signed)
Patient had a mild allergic reaction; itching and sneezing. Dr. Jeralyn Ruths assessed patient. RN monitored patient for 15 minutes. Patient given instructions for premeds in the future if needed contrast and instructions if symptoms became worse.

## 2017-09-17 ENCOUNTER — Other Ambulatory Visit: Payer: Self-pay

## 2017-09-17 ENCOUNTER — Encounter: Payer: Self-pay | Admitting: Family Medicine

## 2017-09-17 ENCOUNTER — Ambulatory Visit: Payer: BLUE CROSS/BLUE SHIELD | Admitting: Family Medicine

## 2017-09-17 ENCOUNTER — Other Ambulatory Visit: Payer: Self-pay | Admitting: Family Medicine

## 2017-09-17 VITALS — BP 122/62 | HR 84 | Temp 98.7°F | Resp 18 | Ht 67.0 in | Wt 154.0 lb

## 2017-09-17 DIAGNOSIS — F17200 Nicotine dependence, unspecified, uncomplicated: Secondary | ICD-10-CM | POA: Diagnosis not present

## 2017-09-17 DIAGNOSIS — R059 Cough, unspecified: Secondary | ICD-10-CM

## 2017-09-17 DIAGNOSIS — R05 Cough: Secondary | ICD-10-CM | POA: Diagnosis not present

## 2017-09-17 DIAGNOSIS — R1084 Generalized abdominal pain: Secondary | ICD-10-CM

## 2017-09-17 DIAGNOSIS — R1904 Left lower quadrant abdominal swelling, mass and lump: Secondary | ICD-10-CM | POA: Diagnosis not present

## 2017-09-17 DIAGNOSIS — R1900 Intra-abdominal and pelvic swelling, mass and lump, unspecified site: Secondary | ICD-10-CM

## 2017-09-17 MED ORDER — IPRATROPIUM-ALBUTEROL 0.5-2.5 (3) MG/3ML IN SOLN
3.0000 mL | Freq: Once | RESPIRATORY_TRACT | Status: AC
  Administered 2017-09-17: 3 mL via RESPIRATORY_TRACT

## 2017-09-17 MED ORDER — HYDROCODONE-HOMATROPINE 5-1.5 MG/5ML PO SYRP: 5.0000 mL | ORAL_SOLUTION | Freq: Three times a day (TID) | ORAL | 0 refills | Status: DC | PRN

## 2017-09-17 MED ORDER — ALBUTEROL SULFATE (2.5 MG/3ML) 0.083% IN NEBU
2.5000 mg | INHALATION_SOLUTION | Freq: Once | RESPIRATORY_TRACT | Status: AC
  Administered 2017-09-17: 2.5 mg via RESPIRATORY_TRACT

## 2017-09-17 MED ORDER — PREDNISONE 20 MG PO TABS: 40.0000 mg | ORAL_TABLET | Freq: Every day | ORAL | 0 refills | Status: DC

## 2017-09-17 MED ORDER — BENZONATATE 100 MG PO CAPS: 100.0000 mg | ORAL_CAPSULE | Freq: Three times a day (TID) | ORAL | 0 refills | Status: DC | PRN

## 2017-09-17 MED ORDER — ALBUTEROL SULFATE HFA 108 (90 BASE) MCG/ACT IN AERS: 2.0000 | INHALATION_SPRAY | RESPIRATORY_TRACT | 0 refills | Status: DC | PRN

## 2017-09-17 NOTE — Progress Notes (Signed)
Patient ID: JHERI MITTER, female    DOB: 04-21-1965, 52 y.o.   MRN: [id number]  PCP: Susy Frizzle, MD  Chief Complaint  Patient presents with  . Referral    tiny umbilical hernia, not candidate for surgery since so small-  large adnexal cyst/mass that needs to be worked up by [employer]  . Illness    x3 weeks- nonproductive cough    Subjective:   TAWNY RASPBERRY is a 52 y.o. female, presents to clinic with CC of left adnexal septated mass dx on 08/29/17 via CT abd/pelvis.  CT scan was ordered by General surgeon, Dr. Marlou Starks after she was seen and evaluated for possible abdominal mass and hernia.  She has a small umbilical hernia containing fat which is nonsurgical.  CT report advises pelvic ultrasound follow-up.  She has not been seen again for this since receiving CT results.  She is not currently established with OB/GYN.  She is come here to get referral to gynecology. She endorses 2 years of vague mild to moderate abdominal discomfort with intermittent bloating.  At times she states that she has appeared to look about 9 months pregnant.  She suspects that the mass may have been present for 2 years.  She does have a history of a right ovarian cyst that she believes was a hemorrhagic cyst which led to the right oophorectomy.  She also status post subtotal hysterectomy.  She does not believe either of these OB/GYN's are practicing or currently in the area.  She denies any current abdominal pain or bloating.  She denies any urinary symptoms such as decreased urine output or pain with urination.  She denies any bowel changes such as constipation. Denies unintentional weightloss  PMHx and surgeries reviewed: Dr. Marvel Plan did partial hysterectomy mass on right ovary, had oophrectomy done for suspected hemorrhagic ovarian cyst [facility]  Pt also is ill with URI sx and cough that has been "moving back and forth" from head to chest and sinuses to chest.  She has productive cough with  yellow sputum, wheeze, and chest and head congestion.  When sx began 3 weeks ago she had associated fever and sweats, but none in 2 weeks.  She does feel SOB and has been using someones inhaler.  Mucinex helps a little, but cough is severe and frequent, with coughing fits every time she goes to lay down, take a deep breath or cough.  No H/C chills, CP, HA, N, V, D, rash.  She is still smoking but has cut back since ill x 3 week      Patient Active Problem List   Diagnosis Date Noted  . Smoker 09/01/2013  . Chest pain, unspecified 09/01/2013  . Insomnia   . Former smoker      Prior to Admission medications   Medication Sig Start Date End Date Taking? Authorizing Provider  ALPRAZolam Duanne Moron) 0.5 MG tablet Take 1 tablet (0.5 mg total) by mouth daily as needed. 08/11/17  Yes Susy Frizzle, MD  estrogens, conjugated, (PREMARIN) 0.625 MG tablet TAKE 2 TABLETS BY MOUTH EVERY DAY 04/30/17  Yes Susy Frizzle, MD  omeprazole (PRILOSEC) 40 MG capsule TAKE 1 CAPSULE BY MOUTH EVERY DAY 06/11/17  Yes Susy Frizzle, MD  varenicline (CHANTIX STARTING MONTH PAK) 0.5 MG X 11 & 1 MG X 42 tablet Take as directed 08/11/17  Yes Susy Frizzle, MD     Allergies  Allergen Reactions  . Penicillins Shortness Of Breath  Childhood reaction  . Ivp Dye [Iodinated Diagnostic Agents] Itching    Itching, sneezing, will need premeds in future.  [place] [Levofloxacin In D5w] Rash     Family History  Problem Relation Age of Onset  . Cancer Neg Hx        no breast, ovarian, or colon cancer     Social History   Socioeconomic History  . Marital status: Married    Spouse name: Not on file  . Number of children: Not on file  . Years of education: Not on file  . Highest education level: Not on file  Occupational History  . Not on file  Social Needs  . Financial resource strain: Not on file  . Food insecurity:    Worry: Not on file    Inability: Not on file  . Transportation needs:     Medical: Not on file    Non-medical: Not on file  Tobacco Use  . Smoking status: Current Every Day Smoker    Packs/day: 0.75    Types: Cigarettes  . Smokeless tobacco: Never Used  Substance and Sexual Activity  . Alcohol use: Yes    Comment: twice a week  . Drug use: No  . Sexual activity: Yes  Lifestyle  . Physical activity:    Days per week: Not on file    Minutes per session: Not on file  . Stress: Not on file  Relationships  . Social connections:    Talks on phone: Not on file    Gets together: Not on file    Attends religious service: Not on file    Active member of club or organization: Not on file    Attends meetings of clubs or organizations: Not on file    Relationship status: Not on file  . Intimate partner violence:    Fear of current or ex partner: Not on file    Emotionally abused: Not on file    Physically abused: Not on file    Forced sexual activity: Not on file  Other Topics Concern  . Not on file  Social History Narrative  . Not on file     Review of Systems  Constitutional: Negative.   HENT: Negative.   Eyes: Negative.   Respiratory: Negative.   Cardiovascular: Negative.   Gastrointestinal: Negative.   Endocrine: Negative.   Genitourinary: Negative.   Musculoskeletal: Negative.   Skin: Negative.   Allergic/Immunologic: Negative.   Neurological: Negative.   Hematological: Negative.   Psychiatric/Behavioral: Negative.   All other systems reviewed and are negative. 10 Systems reviewed and are negative for acute change except as noted in the HPI.      Objective:    Vitals:   09/17/17 1104  BP: 122/62  Pulse: 84  Resp: 18  Temp: 98.7 F (37.1 C)  TempSrc: Oral  SpO2: 97%  Weight: 154 lb (69.9 kg)  Height: 5\' 7"  (1.702 m)      Physical Exam  Constitutional: She is oriented to person, place, and time. She appears well-developed and well-nourished.  Non-toxic appearance. No distress.  HENT:  Head: Normocephalic and atraumatic.    Right Ear: External ear normal.  Left Ear: External ear normal.  Nose: Nose normal.  Mouth/Throat: Uvula is midline, oropharynx is clear and moist and mucous membranes are normal.  Eyes: Pupils are equal, round, and reactive to light. Conjunctivae, EOM and lids are normal.  Neck: Normal range of motion and phonation normal. Neck supple. No tracheal deviation present.  Cardiovascular: Normal rate, regular rhythm, normal heart sounds and normal pulses. Exam reveals no gallop and no friction rub.  No murmur heard. Pulses:      Radial pulses are 2+ on the right side, and 2+ on the left side.       Posterior tibial pulses are 2+ on the right side, and 2+ on the left side.  Pulmonary/Chest: Effort normal and breath sounds normal. No stridor. No respiratory distress. She has no wheezes. She has no rhonchi. She has no rales. She exhibits no tenderness.  Abdominal: Soft. Normal appearance and bowel sounds are normal. She exhibits no distension. There is no tenderness. There is no rebound and no guarding.  Musculoskeletal: Normal range of motion. She exhibits no edema or deformity.  Lymphadenopathy:    She has no cervical adenopathy.  Neurological: She is alert and oriented to person, place, and time. She exhibits normal muscle tone. Gait normal.  Skin: Skin is warm, dry and intact. Capillary refill takes less than 2 seconds. No rash noted. She is not diaphoretic. No pallor.  Psychiatric: She has a normal mood and affect. Her speech is normal and behavior is normal.  Nursing note and vitals reviewed.         Assessment & Plan:      ICD-10-CM   1. Abdominal mass, left lower quadrant R19.04 US Transvaginal Non-OB    [place] Art/Ven Flow Abd Pelv Doppler    Ambulatory referral to Gynecologic Oncology  2. Generalized abdominal pain R10.84   3. Current smoker F17.200   4. Cough R05 benzonatate (TESSALON) 100 MG capsule    albuterol (PROVENTIL HFA;VENTOLIN HFA) 108 (90 Base) MCG/ACT inhaler     predniSONE (DELTASONE) 20 MG tablet    HYDROcodone-homatropine (HYCODAN) 5-1.5 MG/5ML syrup    DG Chest 2 View    albuterol (PROVENTIL) (2.5 MG/3ML) 0.083% nebulizer solution 2.5 mg    ipratropium-albuterol (DUONEB) 0.5-2.5 (3) MG/3ML nebulizer solution 3 mL    abd mass - eval with pelvic US per CT result recommendation.  No exam tenderness.  Most likely an ovarian mass or cyst.  No weight loss.  Will need GYN and likely Gyn Onc.  Referral made, [place] pending this week.  Pt had wheeze on exam, with severe coughing, breathing tx improved the wheeze a little and coughing was moderately improved, suspect acute bronchitis in a smoker. CXR to eval, pt does not wish to do today.  Will start tx with breathing tx, steroids cough meds, mucinex.  Abx per CXR.  Afebrile today, no night sweats, lungs tight and wheezy, but did not hear rales to clinically dx CAP.     Delsa Grana, PA-C 09/17/17 11:10 AM

## 2017-09-17 NOTE — Patient Instructions (Addendum)
Will work on getting ultrasound imaging done and Gynecologist referral done  For cough -take for steroid dose as soon as you get medications, use inhaler 2 to 4 puffs every 4 hours for the first 2 days, can use the other cough medicines just to help with your symptoms.  If your symptoms do not improve I have ordered a chest x-ray to further evaluate your lungs, you can go to [facility] or to [facility], any time they will pull your order and get it done.  If you have fevers, sweats, chest pain or shortness of breath please return for recheck.

## 2017-09-18 ENCOUNTER — Other Ambulatory Visit: Payer: Self-pay | Admitting: Orthopaedic Surgery

## 2017-09-18 ENCOUNTER — Other Ambulatory Visit: Payer: Self-pay | Admitting: Orthopedic Surgery

## 2017-09-19 ENCOUNTER — Ambulatory Visit (HOSPITAL_COMMUNITY)
Admission: RE | Admit: 2017-09-19 | Discharge: 2017-09-19 | Disposition: A | Discharge status: Home / Self Care | Payer: BLUE CROSS/BLUE SHIELD | Source: Ambulatory Visit | Attending: Family Medicine | Admitting: Family Medicine

## 2017-09-19 DIAGNOSIS — Z90710 Acquired absence of both cervix and uterus: Secondary | ICD-10-CM | POA: Insufficient documentation

## 2017-09-19 DIAGNOSIS — R1900 Intra-abdominal and pelvic swelling, mass and lump, unspecified site: Secondary | ICD-10-CM | POA: Insufficient documentation

## 2017-09-19 DIAGNOSIS — N83292 Other ovarian cyst, left side: Secondary | ICD-10-CM | POA: Diagnosis not present

## 2017-09-21 NOTE — Progress Notes (Signed)
[place] at [facility] Note: New Patient FIRST VISIT   Consult was requested by Dr. Jenna Luo, patients PCP for incidental left adnexal lesion  Chief Complaint  Patient presents with  . Ovarian mass    ONCOLOGIC SUMMARY 1. TBD o N/A  HPI: [name]  is a very nice 52 y.o.  P1  The patient was referred to General Surgery for an umbilical hernia. General surgery felt the hernia did not require surgery. She was found on imaging to have an incidental left adnexal septated mass (08/29/17 CT abd/pelvis). She was then referred to [place] by her PCP to address the adnexal mass. A Follwoup [place] was ordered (noted below)  Looking back she thinks over the past 6 months she has had episodes of pelvic pain, but only intermittent and so really did not pay much attention.  She does have a history of a right ovarian cyst that she believes was a hemorrhagic cyst which led to a right oophorectomy 2002.  She also status vaginal hysterectomy for cervical dysplasia and AUB in her 20's.    Denies appetite change, weight loss, N/V. Does note early satiety and intermittent pain, including dysparunia. No bowel issues.  She does note a URI/ cough. She was placed on steroids and CXR was ordered. She has not followed through on getting that done. She states she is starting a Zpack today.  She is a smoker.  A CA125 was done in 2015 for unclear reason, but not recently.    08/29/17 CT  -  Status post hysterectomy. 4.6 cm septated low-density lesion in the left adnexa.   09/17/17 US showed Left ovary Measurements: 5.1 x 3.1 x 5.3 cm. Complex septated cyst measuring 4.3 x 3.2 x 4.9 cm visualized, corresponding with abnormality seen on prior CT. Single internal septation measuring approximately 2-3 mm present. Additional few scattered low-level internal echoes. No associated vascularity or definite solid components. Other findings No abnormal free  fluid  NOTE PCB allergy, but states cephalosporins OK  Imported EPIC Oncologic History:   No history exists.    Measurement of disease:  . CA 125 TBD . CEA TBD  Radiology: Ct Abdomen Pelvis W Contrast  Result Date: 08/29/2017 CLINICAL DATA:  Epigastric pain.  Evaluate for possible hernia. EXAM: CT ABDOMEN AND PELVIS WITH CONTRAST TECHNIQUE: Multidetector CT imaging of the abdomen and pelvis was performed using the standard protocol following bolus administration of intravenous contrast. CONTRAST:  174mL ISOVUE-300 IOPAMIDOL (ISOVUE-300) INJECTION 61% After contrast administration, the patient developed itching and sneezing, consistent with mild reaction. Patient was appropriately assessed by the radiologist on site and monitored for further worsening reaction prior to discharge. COMPARISON:  Abdominal ultrasound dated August 03, 2003. FINDINGS: Lower chest: No acute abnormality. Dependent subsegmental atelectasis. Hepatobiliary: No focal liver abnormality is seen. No gallstones, gallbladder wall thickening, or biliary dilatation. Pancreas: Unremarkable. No pancreatic ductal dilatation or surrounding inflammatory changes. Spleen: Normal in size without focal abnormality. Adrenals/Urinary Tract: Adrenal glands are unremarkable. Kidneys are normal, without renal calculi, focal lesion, or hydronephrosis. Bladder is unremarkable. Stomach/Bowel: The stomach is within normal limits. No bowel wall thickening, distention, or surrounding inflammatory changes. The appendix is surgically absent. Vascular/Lymphatic: Aortic atherosclerosis. No enlarged abdominal or pelvic lymph nodes. Reproductive: Status post hysterectomy. 4.6 cm septated low-density lesion in the left adnexa. No right adnexal mass. Other: Tiny fat containing umbilical hernia. No abdominopelvic ascites. No pneumoperitoneum. Musculoskeletal: No acute or significant osseous findings. IMPRESSION:  1. 4.6 cm septated low-density left adnexal lesion.  Recommend pelvic ultrasound for further evaluation. This recommendation follows ACR consensus guidelines: White Paper of the ACR Incidental Findings Committee II on Adnexal Findings. J Am Coll Radiol 2013:10:675-681. 2. Tiny fat containing umbilical hernia. Electronically Signed   By: Titus Dubin M.D.   On: 08/29/2017 15:29   US Pelvic Complete W Transvaginal And Torsion R/o  Result Date: 09/19/2017 CLINICAL DATA:  Initial evaluation for pelvic mass. History of prior hysterectomy and unilateral oophorectomy. EXAM: TRANSABDOMINAL AND TRANSVAGINAL ULTRASOUND OF PELVIS TECHNIQUE: Both transabdominal and transvaginal ultrasound examinations of the pelvis were performed. Transabdominal technique was performed for global imaging of the pelvis including uterus, ovaries, adnexal regions, and pelvic cul-de-sac. It was necessary to proceed with endovaginal exam following the transabdominal exam to visualize the pelvic structures. COMPARISON:  Prior CT from 08/29/2017. FINDINGS: Uterus Surgically absent.  No abnormality at the vaginal cuff. Endometrium Surgically absent. Right ovary Not visualize, reportedly removed.  No adnexal mass. Left ovary Measurements: 5.1 x 3.1 x 5.3 cm. Complex septated cyst measuring 4.3 x 3.2 x 4.9 cm visualized, corresponding with abnormality seen on prior CT. Single internal septation measuring approximately 2-3 mm present. Additional few scattered low-level internal echoes. No associated vascularity or definite solid components. Other findings No abnormal free fluid. IMPRESSION: 1. 4.9 cm complex septated left adnexal cyst, with indeterminate imaging features. Gynecologic referral for further workup recommended. Additionally, follow-up examination with dedicated pelvic MRI could be performed for further assessment and characterization as warranted. No free fluid. 2. Prior hysterectomy and unilateral oophorectomy with nonvisualization of the uterus and right ovary. Electronically Signed    By: Jeannine Boga M.D.   On: 09/19/2017 16:46   .   Outpatient Encounter Medications as of 09/22/2017  Medication Sig  . albuterol (PROVENTIL HFA;VENTOLIN HFA) 108 (90 Base) MCG/ACT inhaler Inhale 2 puffs into the lungs every 4 (four) hours as needed for wheezing or shortness of breath.  . ALPRAZolam (XANAX) 0.5 MG tablet Take 1 tablet (0.5 mg total) by mouth daily as needed.  Marland Kitchen azithromycin (ZITHROMAX) 250 MG tablet Take (2) tablets by mouth on day 1, then take (1) tablet by mouth on days 2-5.  . benzonatate (TESSALON) 100 MG capsule Take 1 capsule (100 mg total) by mouth 3 (three) times daily as needed for cough.  . estrogens, conjugated, (PREMARIN) 0.625 MG tablet TAKE 2 TABLETS BY MOUTH EVERY DAY  . omeprazole (PRILOSEC) 40 MG capsule TAKE 1 CAPSULE BY MOUTH EVERY DAY  . varenicline (CHANTIX STARTING MONTH PAK) 0.5 MG X 11 & 1 MG X 42 tablet Take as directed (Patient not taking: Reported on 09/22/2017)  . [DISCONTINUED] HYDROcodone-homatropine (HYCODAN) 5-1.5 MG/5ML syrup Take 5 mLs by mouth every 8 (eight) hours as needed for cough.  . [DISCONTINUED] predniSONE (DELTASONE) 20 MG tablet Take 2 tablets (40 mg total) by mouth daily with breakfast for 5 days.   No facility-administered encounter medications on file as of 09/22/2017.    Allergies  Allergen Reactions  . Penicillins Shortness Of Breath    Childhood reaction. NOTES that she is OK with cephalosporins  . Ivp Dye [Iodinated Diagnostic Agents] Itching    Itching, sneezing, will need premeds in future.  Mack Hook [Levofloxacin In D5w] Rash    Past Medical History:  Diagnosis Date  . Bronchitis   . Former smoker   . Hernia, umbilical   . Insomnia    Past Surgical History:  Procedure Laterality Date  . APPENDECTOMY    .  OOPHORECTOMY     USO 2002  . VAGINAL HYSTERECTOMY     dysplasia and AUB        Past Gynecological History:   GYNECOLOGIC HISTORY:  . No LMP recorded. Patient has had a hysterectomy.  52yo . Menarche: 52 years old . P 1 . Contraceptive OCP . HRT currently  . Last Pap 2000 Family Hx:  Family History  Problem Relation Age of Onset  . Cancer Neg Hx        no breast, ovarian, or colon cancer   Social Hx:  Marland Kitchen Tobacco use: current every day, states quit with current bronchitis . Alcohol use: 2/week . Illicit Drug use: none . Illicit IV Drug use: none    Review of Systems: Review of Systems  Gastrointestinal: Positive for abdominal pain.  Genitourinary: Positive for dyspareunia and pelvic pain.   All other systems reviewed and are negative. early satiety  Vitals: There were no vitals taken for this visit. Vitals:   09/22/17 1424  Weight: 156 lb 1 oz (70.8 kg)  Height: 5\' 7"  (1.702 m)    Vitals:   09/22/17 1424  BP: 120/63  Pulse: 87  Resp: 18  Temp: 98.2 F (36.8 C)  SpO2: 96%   Body mass index is 24.44 kg/m.   Physical Exam: General :  Well developed, 52 y.o., female in no apparent distress HEENT:  Normocephalic/atraumatic, symmetric, EOMI, eyelids normal Neck:   Supple, no masses.  Lymphatics:  No cervical/ submandibular/ supraclavicular/ infraclavicular/ inguinal adenopathy Respiratory:  Respirations unlabored, no use of accessory muscles CV:   Deferred Breast:  Deferred Musculoskeletal: No CVA tenderness, normal muscle strength. Abdomen:  Mild tender deep palpation. No rebound, no guardingSoft and nondistended. No evidence of hernia. No masses. Extremities:  No lymphedema, no erythema, non-tender. Skin:   Normal inspection Neuro/Psych:  No focal motor deficit, no abnormal mental status. Normal gait. Normal affect. Alert and oriented to person, place, and time  Genito Urinary: Vulva: Normal external female genitalia.  Bladder/urethra: Urethral meatus normal in size and location. No lesions or   masses, well supported bladder Speculum exam: Vagina: No lesion, no discharge, no bleeding. Bimanual exam: Cervix/Uterus/Adnexa: Surgically  absent  Adnexa: No masses. Rectovaginal:  Good tone, no masses, no cul de sac nodularity, no parametrial involvement or nodularity.   Assessment  Pelvic mass (left)  Plan  1. Data reviewed ? I independently reviewed the images and the radiology reports and discussed my interpretation in the presence of the patient today ? The ultrasound is reassuring in the sense there is no abnormal doppler flow internally and the complexity mostly relates to a septation in the cyst ? I reviewed her referring doctor's office notes and I have summarized in the HPI ? History was obtained from the patient and the chart ? We reviewed that we will obtain tumor markers 2. I offered her surgical resection versus observation (with repeat ultrasound ~6 weeks) if normal tumor markers ? If elevated tumor markers surgery recommended ? She prefers surgical management and as soon as possible due to the busy time at work near the end of the year. 3. Preoperative items include ? CA125 and CEA 4. Surgical discussion ? We agreed on a plan for laparoscopic (robotic)  USO she does understand there is a risk regardless of the intent for minimally invasive procedure that laparotomy may be the final modality ? We discussed if malignancy is found at the time of surgery that surgery may entail additional sampling and even  possible laparotomy ? Surgical sketch was reviewed including the risk, benefits, alternatives of the procedure and she was given a copy of the surgical sketch the end of the visit. 5. Return to clinic 10 to 14 days postop to review pathology and evaluate incisions 6. Offered surgery this week, but she has bronchitis and has been started on Zpack just today. ? Therefore will hold off until next availability which is early October 7. She had no further questions by the end of her visit.  [name], MD  09/22/2017, 5:33 PM    Cc: Susy Frizzle, MD (Referring PCP)

## 2017-09-21 NOTE — H&P (View-Only) (Signed)
[place] at [facility] Note: New Patient FIRST VISIT   Consult was requested by Dr. Jenna Luo, patients PCP for incidental left adnexal lesion  Chief Complaint  Patient presents with  . Ovarian mass    ONCOLOGIC SUMMARY 1. TBD o N/A  HPI: [name]  is a very nice 52 y.o.  P1  The patient was referred to General Surgery for an umbilical hernia. General surgery felt the hernia did not require surgery. She was found on imaging to have an incidental left adnexal septated mass (08/29/17 CT abd/pelvis). She was then referred to [place] by her PCP to address the adnexal mass. A Follwoup [place] was ordered (noted below)  Looking back she thinks over the past 6 months she has had episodes of pelvic pain, but only intermittent and so really did not pay much attention.  She does have a history of a right ovarian cyst that she believes was a hemorrhagic cyst which led to a right oophorectomy 2002.  She also status vaginal hysterectomy for cervical dysplasia and AUB in her 20's.    Denies appetite change, weight loss, N/V. Does note early satiety and intermittent pain, including dysparunia. No bowel issues.  She does note a URI/ cough. She was placed on steroids and CXR was ordered. She has not followed through on getting that done. She states she is starting a Zpack today.  She is a smoker.  A CA125 was done in 2015 for unclear reason, but not recently.    08/29/17 CT  -  Status post hysterectomy. 4.6 cm septated low-density lesion in the left adnexa.   09/17/17 US showed Left ovary Measurements: 5.1 x 3.1 x 5.3 cm. Complex septated cyst measuring 4.3 x 3.2 x 4.9 cm visualized, corresponding with abnormality seen on prior CT. Single internal septation measuring approximately 2-3 mm present. Additional few scattered low-level internal echoes. No associated vascularity or definite solid components. Other findings No abnormal free  fluid  NOTE PCB allergy, but states cephalosporins OK  Imported EPIC Oncologic History:   No history exists.    Measurement of disease:  . CA 125 TBD . CEA TBD  Radiology: Ct Abdomen Pelvis W Contrast  Result Date: 08/29/2017 CLINICAL DATA:  Epigastric pain.  Evaluate for possible hernia. EXAM: CT ABDOMEN AND PELVIS WITH CONTRAST TECHNIQUE: Multidetector CT imaging of the abdomen and pelvis was performed using the standard protocol following bolus administration of intravenous contrast. CONTRAST:  134mL ISOVUE-300 IOPAMIDOL (ISOVUE-300) INJECTION 61% After contrast administration, the patient developed itching and sneezing, consistent with mild reaction. Patient was appropriately assessed by the radiologist on site and monitored for further worsening reaction prior to discharge. COMPARISON:  Abdominal ultrasound dated August 03, 2003. FINDINGS: Lower chest: No acute abnormality. Dependent subsegmental atelectasis. Hepatobiliary: No focal liver abnormality is seen. No gallstones, gallbladder wall thickening, or biliary dilatation. Pancreas: Unremarkable. No pancreatic ductal dilatation or surrounding inflammatory changes. Spleen: Normal in size without focal abnormality. Adrenals/Urinary Tract: Adrenal glands are unremarkable. Kidneys are normal, without renal calculi, focal lesion, or hydronephrosis. Bladder is unremarkable. Stomach/Bowel: The stomach is within normal limits. No bowel wall thickening, distention, or surrounding inflammatory changes. The appendix is surgically absent. Vascular/Lymphatic: Aortic atherosclerosis. No enlarged abdominal or pelvic lymph nodes. Reproductive: Status post hysterectomy. 4.6 cm septated low-density lesion in the left adnexa. No right adnexal mass. Other: Tiny fat containing umbilical hernia. No abdominopelvic ascites. No pneumoperitoneum. Musculoskeletal: No acute or significant osseous findings. IMPRESSION:  1. 4.6 cm septated low-density left adnexal lesion.  Recommend pelvic ultrasound for further evaluation. This recommendation follows ACR consensus guidelines: White Paper of the ACR Incidental Findings Committee II on Adnexal Findings. J Am Coll Radiol 2013:10:675-681. 2. Tiny fat containing umbilical hernia. Electronically Signed   By: Titus Dubin M.D.   On: 08/29/2017 15:29   US Pelvic Complete W Transvaginal And Torsion R/o  Result Date: 09/19/2017 CLINICAL DATA:  Initial evaluation for pelvic mass. History of prior hysterectomy and unilateral oophorectomy. EXAM: TRANSABDOMINAL AND TRANSVAGINAL ULTRASOUND OF PELVIS TECHNIQUE: Both transabdominal and transvaginal ultrasound examinations of the pelvis were performed. Transabdominal technique was performed for global imaging of the pelvis including uterus, ovaries, adnexal regions, and pelvic cul-de-sac. It was necessary to proceed with endovaginal exam following the transabdominal exam to visualize the pelvic structures. COMPARISON:  Prior CT from 08/29/2017. FINDINGS: Uterus Surgically absent.  No abnormality at the vaginal cuff. Endometrium Surgically absent. Right ovary Not visualize, reportedly removed.  No adnexal mass. Left ovary Measurements: 5.1 x 3.1 x 5.3 cm. Complex septated cyst measuring 4.3 x 3.2 x 4.9 cm visualized, corresponding with abnormality seen on prior CT. Single internal septation measuring approximately 2-3 mm present. Additional few scattered low-level internal echoes. No associated vascularity or definite solid components. Other findings No abnormal free fluid. IMPRESSION: 1. 4.9 cm complex septated left adnexal cyst, with indeterminate imaging features. Gynecologic referral for further workup recommended. Additionally, follow-up examination with dedicated pelvic MRI could be performed for further assessment and characterization as warranted. No free fluid. 2. Prior hysterectomy and unilateral oophorectomy with nonvisualization of the uterus and right ovary. Electronically Signed    By: Jeannine Boga M.D.   On: 09/19/2017 16:46   .   Outpatient Encounter Medications as of 09/22/2017  Medication Sig  . albuterol (PROVENTIL HFA;VENTOLIN HFA) 108 (90 Base) MCG/ACT inhaler Inhale 2 puffs into the lungs every 4 (four) hours as needed for wheezing or shortness of breath.  . ALPRAZolam (XANAX) 0.5 MG tablet Take 1 tablet (0.5 mg total) by mouth daily as needed.  Marland Kitchen azithromycin (ZITHROMAX) 250 MG tablet Take (2) tablets by mouth on day 1, then take (1) tablet by mouth on days 2-5.  . benzonatate (TESSALON) 100 MG capsule Take 1 capsule (100 mg total) by mouth 3 (three) times daily as needed for cough.  . estrogens, conjugated, (PREMARIN) 0.625 MG tablet TAKE 2 TABLETS BY MOUTH EVERY DAY  . omeprazole (PRILOSEC) 40 MG capsule TAKE 1 CAPSULE BY MOUTH EVERY DAY  . varenicline (CHANTIX STARTING MONTH PAK) 0.5 MG X 11 & 1 MG X 42 tablet Take as directed (Patient not taking: Reported on 09/22/2017)  . [DISCONTINUED] HYDROcodone-homatropine (HYCODAN) 5-1.5 MG/5ML syrup Take 5 mLs by mouth every 8 (eight) hours as needed for cough.  . [DISCONTINUED] predniSONE (DELTASONE) 20 MG tablet Take 2 tablets (40 mg total) by mouth daily with breakfast for 5 days.   No facility-administered encounter medications on file as of 09/22/2017.    Allergies  Allergen Reactions  . Penicillins Shortness Of Breath    Childhood reaction. NOTES that she is OK with cephalosporins  . Ivp Dye [Iodinated Diagnostic Agents] Itching    Itching, sneezing, will need premeds in future.  Mack Hook [Levofloxacin In D5w] Rash    Past Medical History:  Diagnosis Date  . Bronchitis   . Former smoker   . Hernia, umbilical   . Insomnia    Past Surgical History:  Procedure Laterality Date  . APPENDECTOMY    .  OOPHORECTOMY     USO 2002  . VAGINAL HYSTERECTOMY     dysplasia and AUB        Past Gynecological History:   GYNECOLOGIC HISTORY:  . No LMP recorded. Patient has had a hysterectomy.  52yo . Menarche: 52 years old . P 1 . Contraceptive OCP . HRT currently  . Last Pap 2000 Family Hx:  Family History  Problem Relation Age of Onset  . Cancer Neg Hx        no breast, ovarian, or colon cancer   Social Hx:  Marland Kitchen Tobacco use: current every day, states quit with current bronchitis . Alcohol use: 2/week . Illicit Drug use: none . Illicit IV Drug use: none    Review of Systems: Review of Systems  Gastrointestinal: Positive for abdominal pain.  Genitourinary: Positive for dyspareunia and pelvic pain.   All other systems reviewed and are negative. early satiety  Vitals: There were no vitals taken for this visit. Vitals:   09/22/17 1424  Weight: 156 lb 1 oz (70.8 kg)  Height: 5\' 7"  (1.702 m)    Vitals:   09/22/17 1424  BP: 120/63  Pulse: 87  Resp: 18  Temp: 98.2 F (36.8 C)  SpO2: 96%   Body mass index is 24.44 kg/m.   Physical Exam: General :  Well developed, 52 y.o., female in no apparent distress HEENT:  Normocephalic/atraumatic, symmetric, EOMI, eyelids normal Neck:   Supple, no masses.  Lymphatics:  No cervical/ submandibular/ supraclavicular/ infraclavicular/ inguinal adenopathy Respiratory:  Respirations unlabored, no use of accessory muscles CV:   Deferred Breast:  Deferred Musculoskeletal: No CVA tenderness, normal muscle strength. Abdomen:  Mild tender deep palpation. No rebound, no guardingSoft and nondistended. No evidence of hernia. No masses. Extremities:  No lymphedema, no erythema, non-tender. Skin:   Normal inspection Neuro/Psych:  No focal motor deficit, no abnormal mental status. Normal gait. Normal affect. Alert and oriented to person, place, and time  Genito Urinary: Vulva: Normal external female genitalia.  Bladder/urethra: Urethral meatus normal in size and location. No lesions or   masses, well supported bladder Speculum exam: Vagina: No lesion, no discharge, no bleeding. Bimanual exam: Cervix/Uterus/Adnexa: Surgically  absent  Adnexa: No masses. Rectovaginal:  Good tone, no masses, no cul de sac nodularity, no parametrial involvement or nodularity.   Assessment  Pelvic mass (left)  Plan  1. Data reviewed ? I independently reviewed the images and the radiology reports and discussed my interpretation in the presence of the patient today ? The ultrasound is reassuring in the sense there is no abnormal doppler flow internally and the complexity mostly relates to a septation in the cyst ? I reviewed her referring doctor's office notes and I have summarized in the HPI ? History was obtained from the patient and the chart ? We reviewed that we will obtain tumor markers 2. I offered her surgical resection versus observation (with repeat ultrasound ~6 weeks) if normal tumor markers ? If elevated tumor markers surgery recommended ? She prefers surgical management and as soon as possible due to the busy time at work near the end of the year. 3. Preoperative items include ? CA125 and CEA 4. Surgical discussion ? We agreed on a plan for laparoscopic (robotic)  USO she does understand there is a risk regardless of the intent for minimally invasive procedure that laparotomy may be the final modality ? We discussed if malignancy is found at the time of surgery that surgery may entail additional sampling and even  possible laparotomy ? Surgical sketch was reviewed including the risk, benefits, alternatives of the procedure and she was given a copy of the surgical sketch the end of the visit. 5. Return to clinic 10 to 14 days postop to review pathology and evaluate incisions 6. Offered surgery this week, but she has bronchitis and has been started on Zpack just today. ? Therefore will hold off until next availability which is early October 7. She had no further questions by the end of her visit.  [name], MD  09/22/2017, 5:33 PM    Cc: Susy Frizzle, MD (Referring PCP)

## 2017-09-22 ENCOUNTER — Encounter: Payer: Self-pay | Admitting: Obstetrics

## 2017-09-22 ENCOUNTER — Telehealth: Payer: Self-pay | Admitting: *Deleted

## 2017-09-22 ENCOUNTER — Inpatient Hospital Stay: Payer: BLUE CROSS/BLUE SHIELD | Attending: Obstetrics | Admitting: Obstetrics

## 2017-09-22 ENCOUNTER — Inpatient Hospital Stay: Payer: BLUE CROSS/BLUE SHIELD

## 2017-09-22 VITALS — BP 120/63 | HR 87 | Temp 98.2°F | Resp 18 | Ht 67.0 in | Wt 156.1 lb

## 2017-09-22 DIAGNOSIS — D3912 Neoplasm of uncertain behavior of left ovary: Secondary | ICD-10-CM

## 2017-09-22 DIAGNOSIS — F17210 Nicotine dependence, cigarettes, uncomplicated: Secondary | ICD-10-CM

## 2017-09-22 DIAGNOSIS — N838 Other noninflammatory disorders of ovary, fallopian tube and broad ligament: Secondary | ICD-10-CM

## 2017-09-22 DIAGNOSIS — Z90722 Acquired absence of ovaries, bilateral: Secondary | ICD-10-CM

## 2017-09-22 DIAGNOSIS — R970 Elevated carcinoembryonic antigen [CEA]: Secondary | ICD-10-CM

## 2017-09-22 DIAGNOSIS — Z90710 Acquired absence of both cervix and uterus: Secondary | ICD-10-CM

## 2017-09-22 MED ORDER — AZITHROMYCIN 250 MG PO TABS: ORAL_TABLET | ORAL | 0 refills | Status: DC

## 2017-09-22 NOTE — Telephone Encounter (Signed)
Ok with zpack 

## 2017-09-22 NOTE — Telephone Encounter (Signed)
Received call from patient.   Reports that she was seen on 09/17/2017 by PA to F/U abdominal mass and cough. States that she was diagnosed with URI and given prednisone.   States that she has completed the prednisone and continues to use the cough medication and inhaler with no relief. Chest congestion continues. Denies fever.    States that she was supposed to obtain CXR if cough was not resolved, but due to her high deductible plan, she cannot afford to do so at this time since she has had to recently pay co-pay for [place] and CT.   Requested order for ABTx. MD please advise.

## 2017-09-22 NOTE — Patient Instructions (Addendum)
We will be obtaining a CA 125 and CEA level today and will contact you with the results.  Plan to have a chest xray prior to surgery with your pre-op appointment.                Preparing for your Surgery  Plan for surgery on October 08, 2017 with Dr. Precious Haws at [facility] will be scheduled for a robotic assisted unilateral salpingo-oophorectomy, possible exploratory laparotomy, possible staging.  Pre-operative Testing -You will receive a phone call from presurgical testing at [facility] to arrange for a pre-operative testing appointment before your surgery.  This appointment normally occurs one to two weeks before your scheduled surgery.   -Bring your insurance card, copy of an advanced directive if applicable, medication list  -At that visit, you will be asked to sign a consent for a possible blood transfusion in case a transfusion becomes necessary during surgery.  The need for a blood transfusion is rare but having consent is a necessary part of your care.     -You should not be taking blood thinners or aspirin at least ten days prior to surgery unless instructed by your surgeon.  Day Before Surgery at [place] will be asked to take in a light diet the day before surgery.  Avoid carbonated beverages.  You will be advised to have nothing to eat or drink after midnight the evening before.    Eat a light diet the day before surgery.  Examples including soups, broths, toast, yogurt, mashed potatoes.  Things to avoid include carbonated beverages (fizzy beverages), raw fruits and raw vegetables, or beans.   If your bowels are filled with gas, your surgeon will have difficulty visualizing your pelvic organs which increases your surgical risks.  Your role in recovery Your role is to become active as soon as directed by your doctor, while still giving yourself time to heal.  Rest when you feel tired. You will be asked to do the following in order to speed your  recovery:  - Cough and breathe deeply. This helps toclear and expand your lungs and can prevent pneumonia. You may be given a spirometer to practice deep breathing. A staff member will show you how to use the spirometer. - Do mild physical activity. Walking or moving your legs help your circulation and body functions return to normal. A staff member will help you when you try to walk and will provide you with simple exercises. Do not try to get up or walk alone the first time. - Actively manage your pain. Managing your pain lets you move in comfort. We will ask you to rate your pain on a scale of zero to 10. It is your responsibility to tell your doctor or nurse where and how much you hurt so your pain can be treated.  Special Considerations -If you are diabetic, you may be placed on insulin after surgery to have closer control over your blood sugars to promote healing and recovery.  This does not mean that you will be discharged on insulin.  If applicable, your oral antidiabetics will be resumed when you are tolerating a solid diet.  -Your final pathology results from surgery should be available by the Friday after surgery and the results will be relayed to you when available.  -Dr. Everitt Amber is the Surgeon that assists your GYN Oncologist with surgery.  The next day after your surgery you will either see your GYN Oncologist, Dr. Denman George,  or Dr. Lahoma Crocker.   Blood Transfusion Information WHAT IS A BLOOD TRANSFUSION? A transfusion is the replacement of blood or some of its parts. Blood is made up of multiple cells which provide different functions.  Red blood cells carry oxygen and are used for blood loss replacement.  White blood cells fight against infection.  Platelets control bleeding.  Plasma helps clot blood.  Other blood products are available for specialized needs, such as hemophilia or other clotting disorders. BEFORE THE TRANSFUSION  Who gives blood for transfusions?    You may be able to donate blood to be used at a later date on yourself (autologous donation).  Relatives can be asked to donate blood. This is generally not any safer than if you have received blood from a stranger. The same precautions are taken to ensure safety when a relative's blood is donated.  Healthy volunteers who are fully evaluated to make sure their blood is safe. This is blood bank blood. Transfusion therapy is the safest it has ever been in the practice of medicine. Before blood is taken from a donor, a complete history is taken to make sure that person has no history of diseases nor engages in risky social behavior (examples are intravenous drug use or sexual activity with multiple partners). The donor's travel history is screened to minimize risk of transmitting infections, such as malaria. The donated blood is tested for signs of infectious diseases, such as HIV and hepatitis. The blood is then tested to be sure it is compatible with you in order to minimize the chance of a transfusion reaction. If you or a relative donates blood, this is often done in anticipation of surgery and is not appropriate for emergency situations. It takes many days to process the donated blood. RISKS AND COMPLICATIONS Although transfusion therapy is very safe and saves many lives, the main dangers of transfusion include:   Getting an infectious disease.  Developing a transfusion reaction. This is an allergic reaction to something in the blood you were given. Every precaution is taken to prevent this. The decision to have a blood transfusion has been considered carefully by your caregiver before blood is given. Blood is not given unless the benefits outweigh the risks.

## 2017-09-22 NOTE — Telephone Encounter (Signed)
Prescription sent to pharmacy. .   Call placed to patient and patient made aware.  

## 2017-09-23 LAB — CA 125: Cancer Antigen (CA) 125: 5.7 U/mL (ref 0.0–38.1)

## 2017-09-23 LAB — CEA (IN HOUSE-CHCC): CEA (CHCC-In House): 7.02 ng/mL — ABNORMAL HIGH (ref 0.00–5.00)

## 2017-09-24 ENCOUNTER — Encounter: Payer: Self-pay | Admitting: Oncology

## 2017-09-24 ENCOUNTER — Telehealth: Payer: Self-pay | Admitting: Oncology

## 2017-09-24 ENCOUNTER — Telehealth: Payer: Self-pay

## 2017-09-24 DIAGNOSIS — N838 Other noninflammatory disorders of ovary, fallopian tube and broad ligament: Secondary | ICD-10-CM

## 2017-09-24 SURGERY — Surgical Case
Anesthesia: *Unknown

## 2017-09-24 NOTE — Telephone Encounter (Signed)
Told Ms Marchena that her CA-125 was 5.7 which is WNL. The CEA was 7.02.  This is a little elevated as normal range is 0-5.0.  This may be due to her daily smoking per Dr. Gerarda Fraction. Pt has never  had a colonoscopy. Told Ms Reihl that Dr. Gerarda Fraction may want one prior to surgery. Joylene John, NP is sending this information to Dr. Gerarda Fraction.  Will notify her of Dr. Gerarda Fraction' recommendations. Pt is feeling better from bronchitis with Z-pack prescribed by  PCP.

## 2017-09-24 NOTE — Telephone Encounter (Signed)
Called patient and discussed appointment with Dr. Oneida Alar with [facility] tomorrow and colonoscopy on 09/30/17.

## 2017-09-25 ENCOUNTER — Ambulatory Visit (INDEPENDENT_AMBULATORY_CARE_PROVIDER_SITE_OTHER): Payer: BLUE CROSS/BLUE SHIELD | Admitting: Nurse Practitioner

## 2017-09-25 ENCOUNTER — Other Ambulatory Visit: Payer: Self-pay

## 2017-09-25 ENCOUNTER — Encounter: Payer: Self-pay | Admitting: Nurse Practitioner

## 2017-09-25 DIAGNOSIS — Z1211 Encounter for screening for malignant neoplasm of colon: Secondary | ICD-10-CM

## 2017-09-25 DIAGNOSIS — N9489 Other specified conditions associated with female genital organs and menstrual cycle: Secondary | ICD-10-CM | POA: Insufficient documentation

## 2017-09-25 DIAGNOSIS — N839 Noninflammatory disorder of ovary, fallopian tube and broad ligament, unspecified: Secondary | ICD-10-CM

## 2017-09-25 DIAGNOSIS — N838 Other noninflammatory disorders of ovary, fallopian tube and broad ligament: Secondary | ICD-10-CM

## 2017-09-25 NOTE — Patient Instructions (Signed)
Mary Dominguez  09/25/2017     @PREFPERIOPPHARMACY @   Your procedure is scheduled on  09/30/2017  Report to [facility] at  1300   P.M.  Call this number if you have problems the morning of surgery:  [phone]   Remember:  Do not eat or drink after midnight.  You may drink clear liquids until  (follow the instructions given to you) .  Clear liquids allowed are:                    Water, Juice (non-citric and without pulp), Carbonated beverages, Clear Tea, Black Coffee only, Plain Jell-O only, Gatorade and Plain Popsicles only    Take these medicines the morning of surgery with A SIP OF WATER family    Do not wear jewelry, make-up or nail polish.  Do not wear lotions, powders, or perfumes, or deodorant.  Do not shave 48 hours prior to surgery.  Men may shave face and neck.  Do not bring valuables to the hospital.  [facility] is not responsible for any belongings or valuables.  Contacts, dentures or bridgework may not be worn into surgery.  Leave your suitcase in the car.  After surgery it may be brought to your room.  For patients admitted to the hospital, discharge time will be determined by your treatment team.  Patients discharged the day of surgery will not be allowed to drive home.   Name and phone number of your driver:   family Special instructions:  Follow the diet and prep instructions given to you by Dr [name] office.  Please read over the following fact sheets that you were given. Anesthesia Post-op Instructions and Care and Recovery After Surgery       Colonoscopy, Adult A colonoscopy is an exam to look at the large intestine. It is done to check for problems, such as:  Lumps (tumors).  Growths (polyps).  Swelling (inflammation).  Bleeding.  What happens before the procedure? Eating and drinking Follow instructions from your doctor about eating and drinking. These instructions may include:  A few days before the procedure - follow a  low-fiber diet. ? Avoid nuts. ? Avoid seeds. ? Avoid dried fruit. ? Avoid raw fruits. ? Avoid vegetables.  1-3 days before the procedure - follow a clear liquid diet. Avoid liquids that have red or purple dye. Drink only clear liquids, such as: ? Clear broth or bouillon. ? Black coffee or tea. ? Clear juice. ? Clear soft drinks or sports drinks. ? Gelatin dessert. ? Popsicles.  On the day of the procedure - do not eat or drink anything during the 2 hours before the procedure.  Bowel prep If you were prescribed an oral bowel prep:  Take it as told by your doctor. Starting the day before your procedure, you will need to drink a lot of liquid. The liquid will cause you to poop (have bowel movements) until your poop is almost clear or light green.  If your skin or butt gets irritated from diarrhea, you may: ? Wipe the area with wipes that have medicine in them, such as adult wet wipes with aloe and vitamin E. ? Put something on your skin that soothes the area, such as petroleum jelly.  If you throw up (vomit) while drinking the bowel prep, take a break for up to 60 minutes. Then begin the bowel prep again. If you keep throwing up and you cannot take the  bowel prep without throwing up, call your doctor.  General instructions  Ask your doctor about changing or stopping your normal medicines. This is important if you take diabetes medicines or blood thinners.  Plan to have someone take you home from the hospital or clinic. What happens during the procedure?  An IV tube may be put into one of your veins.  You will be given medicine to help you relax (sedative).  To reduce your risk of infection: ? Your doctors will wash their hands. ? Your anal area will be washed with soap.  You will be asked to lie on your side with your knees bent.  Your doctor will get a long, thin, flexible tube ready. The tube will have a camera and a light on the end.  The tube will be put into your  anus.  The tube will be gently put into your large intestine.  Air will be delivered into your large intestine to keep it open. You may feel some pressure or cramping.  The camera will be used to take photos.  A small tissue sample may be removed from your body to be looked at under a microscope (biopsy). If any possible problems are found, the tissue will be sent to a lab for testing.  If small growths are found, your doctor may remove them and have them checked for cancer.  The tube that was put into your anus will be slowly removed. The procedure may vary among doctors and hospitals. What happens after the procedure?  Your doctor will check on you often until the medicines you were given have worn off.  Do not drive for 24 hours after the procedure.  You may have a small amount of blood in your poop.  You may pass gas.  You may have mild cramps or bloating in your belly (abdomen).  It is up to you to get the results of your procedure. Ask your doctor, or the department performing the procedure, when your results will be ready. This information is not intended to replace advice given to you by your health care provider. Make sure you discuss any questions you have with your health care provider. Document Released: 01/26/2010 Document Revised: 10/25/2015 Document Reviewed: 03/07/2015 Elsevier Interactive Patient Education  2017 [employer]  Colonoscopy, Adult, Care After This sheet gives you information about how to care for yourself after your procedure. Your health care provider may also give you more specific instructions. If you have problems or questions, contact your health care provider. What can I expect after the procedure? After the procedure, it is common to have:  A small amount of blood in your stool for 24 hours after the procedure.  Some gas.  Mild abdominal cramping or bloating.  Follow these instructions at home: General instructions   For the first  24 hours after the procedure: ? Do not drive or use machinery. ? Do not sign important documents. ? Do not drink alcohol. ? Do your regular daily activities at a slower pace than normal. ? Eat soft, easy-to-digest foods. ? Rest often.  Take over-the-counter or prescription medicines only as told by your health care provider.  It is up to you to get the results of your procedure. Ask your health care provider, or the department performing the procedure, when your results will be ready. Relieving cramping and bloating  Try walking around when you have cramps or feel bloated.  Apply heat to your abdomen as told by your health care  provider. Use a heat source that your health care provider recommends, such as a moist heat pack or a heating pad. ? Place a towel between your skin and the heat source. ? Leave the heat on for 20-30 minutes. ? Remove the heat if your skin turns bright red. This is especially important if you are unable to feel pain, heat, or cold. You may have a greater risk of getting burned. Eating and drinking  Drink enough fluid to keep your urine clear or pale yellow.  Resume your normal diet as instructed by your health care provider. Avoid heavy or fried foods that are hard to digest.  Avoid drinking alcohol for as long as instructed by your health care provider. Contact a health care provider if:  You have blood in your stool 2-3 days after the procedure. Get help right away if:  You have more than a small spotting of blood in your stool.  You pass large blood clots in your stool.  Your abdomen is swollen.  You have nausea or vomiting.  You have a fever.  You have increasing abdominal pain that is not relieved with medicine. This information is not intended to replace advice given to you by your health care provider. Make sure you discuss any questions you have with your health care provider. Document Released: 08/08/2003 Document Revised: 09/18/2015  Document Reviewed: 03/07/2015 Elsevier Interactive Patient Education  2018 [place] Anesthesia is a term that refers to techniques, procedures, and medicines that help a person stay safe and comfortable during a medical procedure. Monitored anesthesia care, or sedation, is one type of anesthesia. Your anesthesia specialist may recommend sedation if you will be having a procedure that does not require you to be unconscious, such as:  Cataract surgery.  A dental procedure.  A biopsy.  A colonoscopy.  During the procedure, you may receive a medicine to help you relax (sedative). There are three levels of sedation:  Mild sedation. At this level, you may feel awake and relaxed. You will be able to follow directions.  Moderate sedation. At this level, you will be sleepy. You may not remember the procedure.  Deep sedation. At this level, you will be asleep. You will not remember the procedure.  The more medicine you are given, the deeper your level of sedation will be. Depending on how you respond to the procedure, the anesthesia specialist may change your level of sedation or the type of anesthesia to fit your needs. An anesthesia specialist will monitor you closely during the procedure. Let your health care provider know about:  Any allergies you have.  All medicines you are taking, including vitamins, herbs, eye drops, creams, and over-the-counter medicines.  Any use of steroids (by mouth or as a cream).  Any problems you or family members have had with sedatives and anesthetic medicines.  Any blood disorders you have.  Any surgeries you have had.  Any medical conditions you have, such as sleep apnea.  Whether you are pregnant or may be pregnant.  Any use of cigarettes, alcohol, or street drugs. What are the risks? Generally, this is a safe procedure. However, problems may occur, including:  Getting too much medicine  (oversedation).  Nausea.  Allergic reaction to medicines.  Trouble breathing. If this happens, a breathing tube may be used to help with breathing. It will be removed when you are awake and breathing on your own.  Heart trouble.  Lung trouble.  Before the procedure Staying  hydrated Follow instructions from your health care provider about hydration, which may include:  Up to 2 hours before the procedure - you may continue to drink clear liquids, such as water, clear fruit juice, black coffee, and plain tea.  Eating and drinking restrictions Follow instructions from your health care provider about eating and drinking, which may include:  8 hours before the procedure - stop eating heavy meals or foods such as meat, fried foods, or fatty foods.  6 hours before the procedure - stop eating light meals or foods, such as toast or cereal.  6 hours before the procedure - stop drinking milk or drinks that contain milk.  2 hours before the procedure - stop drinking clear liquids.  Medicines Ask your health care provider about:  Changing or stopping your regular medicines. This is especially important if you are taking diabetes medicines or blood thinners.  Taking medicines such as aspirin and ibuprofen. These medicines can thin your blood. Do not take these medicines before your procedure if your health care provider instructs you not to.  Tests and exams  You will have a physical exam.  You may have blood tests done to show: ? How well your kidneys and liver are working. ? How well your blood can clot.  General instructions  Plan to have someone take you home from the hospital or clinic.  If you will be going home right after the procedure, plan to have someone with you for 24 hours.  What happens during the procedure?  Your blood pressure, heart rate, breathing, level of pain and overall condition will be monitored.  An IV tube will be inserted into one of your  veins.  Your anesthesia specialist will give you medicines as needed to keep you comfortable during the procedure. This may mean changing the level of sedation.  The procedure will be performed. After the procedure  Your blood pressure, heart rate, breathing rate, and blood oxygen level will be monitored until the medicines you were given have worn off.  Do not drive for 24 hours if you received a sedative.  You may: ? Feel sleepy, clumsy, or nauseous. ? Feel forgetful about what happened after the procedure. ? Have a sore throat if you had a breathing tube during the procedure. ? Vomit. This information is not intended to replace advice given to you by your health care provider. Make sure you discuss any questions you have with your health care provider. Document Released: 09/19/2004 Document Revised: 06/02/2015 Document Reviewed: 04/16/2015 Elsevier Interactive Patient Education  2018 [place], Care After These instructions provide you with information about caring for yourself after your procedure. Your health care provider may also give you more specific instructions. Your treatment has been planned according to current medical practices, but problems sometimes occur. Call your health care provider if you have any problems or questions after your procedure. What can I expect after the procedure? After your procedure, it is common to:  Feel sleepy for several hours.  Feel clumsy and have poor balance for several hours.  Feel forgetful about what happened after the procedure.  Have poor judgment for several hours.  Feel nauseous or vomit.  Have a sore throat if you had a breathing tube during the procedure.  Follow these instructions at home: For at least 24 hours after the procedure:   Do not: ? Participate in activities in which you could fall or become injured. ? Drive. ? Use heavy machinery. ?  Drink alcohol. ? Take sleeping pills or  medicines that cause drowsiness. ? Make important decisions or sign legal documents. ? Take care of children on your own.  Rest. Eating and drinking  Follow the diet that is recommended by your health care provider.  If you vomit, drink water, juice, or soup when you can drink without vomiting.  Make sure you have little or no nausea before eating solid foods. General instructions  Have a responsible adult stay with you until you are awake and alert.  Take over-the-counter and prescription medicines only as told by your health care provider.  If you smoke, do not smoke without supervision.  Keep all follow-up visits as told by your health care provider. This is important. Contact a health care provider if:  You keep feeling nauseous or you keep vomiting.  You feel light-headed.  You develop a rash.  You have a fever. Get help right away if:  You have trouble breathing. This information is not intended to replace advice given to you by your health care provider. Make sure you discuss any questions you have with your health care provider. Document Released: 04/16/2015 Document Revised: 08/16/2015 Document Reviewed: 04/16/2015 Elsevier Interactive Patient Education  [employer].

## 2017-09-25 NOTE — Assessment & Plan Note (Signed)
The patient is 44 and due for colonoscopy.  She is never had one before.  She is been referred to [place] by the cancer center due to left adnexal mass as per above.  They are requesting colonoscopy prior to surgery.  We were able to work her in for next week.  Overall no GI symptoms other than mild abdominal soreness which she relates to recent acute bronchitis on the tail end of treatment and resolution with associated chronic cough for the past couple weeks.  We will proceed with colonoscopy as previously recommended and based on endoscopist recommendations.  Follow-up based on post procedure recommendations.  Proceed with colonoscopy on propofol/MAC with Dr. Oneida Alar in the near future. The risks, benefits, and alternatives have been discussed in detail with the patient. They state understanding and desire to proceed.   The patient is currently on Xanax.  The patient is not on any other anticoagulants, anxiolytics, chronic pain medications, or antidepressants.  We will plan for the procedure on propofol/MAC based on endoscopist recommendations.

## 2017-09-25 NOTE — Progress Notes (Signed)
CC'D TO PCP °

## 2017-09-25 NOTE — Progress Notes (Addendum)
REVIEWED-NO ADDITIONAL RECOMMENDATIONS.  Primary Care Physician:  Susy Frizzle, MD Primary Gastroenterologist:  Dr. Oneida Alar  Chief Complaint  Patient presents with  . Consult    TCS. Has surgery scheduled for 10/08/17 to have ovarian mass removed. CEA level of 7.02    HPI:   Mary Dominguez is a 52 y.o. female who presents on request of health [place].  The patient has an ovarian mass and surgery scheduled for 10/08/2017 as well as an elevated CEA level on 07/08/2017.  Recommended colonoscopy prior to surgery.  The patient initially was referred to surgery for umbilical hernia which surgery did not feel he needed surgical correction.  An incidental left adnexal mass found on CT 08/29/2017.  The patient followed over the previous 6 months she been having pelvic pain but only intermittent so she did not seek care.  History of right ovarian cyst that she felt was a hemorrhagic cyst which led to right oophorectomy in 2002.  Also vaginal hysterectomy for cervical dysplasia and a UB in her 42s.  Generally no other symptoms.  He is a smoker.  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  No associated vascularity or definite solid components.  Recommended tumor markers at that time including Ca1 25 and CEA.  Discussed likely need for laparoscopic robotic assisted surgery.  Labs drawn 09/22/2017 found Ca1 25 normal, CEA elevated at 7.02.  No history of colonoscopy in our system.  Today she states she's doing well overall. Has never had a colonoscopy. Has chronic intermittent abdominal pain, doesn't feel its a big issue because she's had it her whole life. Denies N/V, hematochezia, melena, fever, chills, unintentional weight loss. BMs are normal, has one daily, typically [place] 4; rare intermittent harder stools depending on diet. Some minimal dyspnea due to acute bronchitis "but on the tial end,  finishing my antibiotics and feelg better.) Denies chest pain, dizziness, lightheadedness, syncope, near syncope. Denies any other upper or lower GI symptoms.  Past Medical History:  Diagnosis Date  . Bronchitis   . Former smoker   . Hernia, umbilical   . Insomnia     Past Surgical History:  Procedure Laterality Date  . APPENDECTOMY    . OOPHORECTOMY     USO 2002  . VAGINAL HYSTERECTOMY     dysplasia and AUB    Current Outpatient Medications  Medication Sig Dispense Refill  . albuterol (PROVENTIL HFA;VENTOLIN HFA) 108 (90 Base) MCG/ACT inhaler Inhale 2 puffs into the lungs every 4 (four) hours as needed for wheezing or shortness of breath. 1 Inhaler 0  . ALPRAZolam (XANAX) 0.5 MG tablet Take 1 tablet (0.5 mg total) by mouth daily as needed. 30 tablet 2  . azithromycin (ZITHROMAX) 250 MG tablet Take (2) tablets by mouth on day 1, then take (1) tablet by mouth on days 2-5. 6 tablet 0  . estrogens, conjugated, (PREMARIN) 0.625 MG tablet TAKE 2 TABLETS BY MOUTH EVERY DAY 180 tablet 3  . omeprazole (PRILOSEC) 40 MG capsule TAKE 1 CAPSULE BY MOUTH EVERY DAY 90 capsule 3   No current facility-administered medications for this visit.     Allergies as of 09/25/2017 - Review Complete 09/25/2017  Allergen Reaction Noted  . Penicillins Shortness Of Breath 04/18/2012  . Ivp dye [iodinated diagnostic agents] Itching 08/29/2017  . Levaquin [levofloxacin in d5w] Rash 01/23/2015    Family History  Problem Relation Age of  Onset  . Cancer Neg Hx        no breast, ovarian, or colon cancer    Social History   Socioeconomic History  . Marital status: Married    Spouse name: Not on file  . Number of children: Not on file  . Years of education: Not on file  . Highest education level: Not on file  Occupational History  . Not on file  Social Needs  . Financial resource strain: Not on file  . Food insecurity:    Worry: Not on file    Inability: Not on file  . Transportation needs:     Medical: Not on file    Non-medical: Not on file  Tobacco Use  . Smoking status: Current Every Day Smoker    Packs/day: 0.75    Types: Cigarettes  . Smokeless tobacco: Never Used  Substance and Sexual Activity  . Alcohol use: Yes    Comment: twice a week  . Drug use: No  . Sexual activity: Yes  Lifestyle  . Physical activity:    Days per week: Not on file    Minutes per session: Not on file  . Stress: Not on file  Relationships  . Social connections:    Talks on phone: Not on file    Gets together: Not on file    Attends religious service: Not on file    Active member of club or organization: Not on file    Attends meetings of clubs or organizations: Not on file    Relationship status: Not on file  . Intimate partner violence:    Fear of current or ex partner: Not on file    Emotionally abused: Not on file    Physically abused: Not on file    Forced sexual activity: Not on file  Other Topics Concern  . Not on file  Social History Narrative  . Not on file    Review of Systems: General: Negative for anorexia, weight loss, fever, chills, fatigue, weakness. ENT: Negative for hoarseness, difficulty swallowing. CV: Negative for chest pain, angina, palpitations, peripheral edema.  Respiratory: Negative for dyspnea at rest, cough, sputum, wheezing.  GI: See history of present illness. MS: Negative for joint pain, low back pain.  Derm: Negative for rash or itching.  Endo: Negative for unusual weight change.  Heme: Negative for bruising or bleeding. Allergy: Negative for rash or hives.    Physical Exam: BP 115/77   Pulse 77   Temp (!) 97.4 F (36.3 C) (Oral)   Ht 5\' 7"  (1.702 m)   Wt 155 lb 12.8 oz (70.7 kg)   BMI 24.40 kg/m  General:   Alert and oriented. Pleasant and cooperative. Well-nourished and well-developed.  Eyes:  Without icterus, sclera clear and conjunctiva pink.  Ears:  Normal auditory acuity. Cardiovascular:  S1, S2 present without murmurs  appreciated. Extremities without clubbing or edema. Respiratory:  Clear to auscultation bilaterally. No wheezes, rales, or rhonchi. No distress.  Gastrointestinal:  +BS, soft, and non-distended. Mild epigastric soreness to palpation (she states "due toall my recent coughing). No HSM noted. No guarding or rebound. No masses appreciated.  Rectal:  Deferred  Musculoskalatal:  Symmetrical without gross deformities. Neurologic:  Alert and oriented x4;  grossly normal neurologically. Psych:  Alert and cooperative. Normal mood and affect. Heme/Lymph/Immune: No excessive bruising noted.    09/25/2017 10:38 AM   Disclaimer: This note was dictated with voice recognition software. Similar sounding words can inadvertently be transcribed and may not  be corrected upon review.

## 2017-09-25 NOTE — Patient Instructions (Signed)
1. We will schedule your colonoscopy for you. 2. Further recommendations will be made after your colonoscopy. 3. Return for follow-up based on post procedure recommendations. 4. Call us if you have any questions or concerns.  At [facility] we value your feedback. You may receive a survey about your visit today. Please share your experience as we strive to create trusting relationships with our patients to provide genuine, compassionate, quality care.  We appreciate your understanding and patience as we review any laboratory studies, imaging, and other diagnostic tests that are ordered as we care for you. Our office policy is 5 business days for review of these results, and any emergent or urgent results are addressed in a timely manner for your best interest. If you do not hear from our office in 1 week, please contact us.   We also encourage the use of MyChart, which contains your medical information for your review as well. If you are not enrolled in this feature, an access code is on this after visit summary for your convenience. Thank you for allowing [place] to be involved in your care.  It was great to meet you today!  I hope you have a great Fall!!

## 2017-09-25 NOTE — Assessment & Plan Note (Signed)
Noted incidental left ovarian mass noted on CT exam.  Corroborated with ultrasound.  She is scheduled to undergo laparoscopic robot-assisted surgery.  They have asked that we complete a colonoscopy.  Elevated CEA levels noted.  She is 12 and is never had a colonoscopy and is due regardless.  We will proceed with colonoscopy as previously recommended.

## 2017-09-29 ENCOUNTER — Encounter (HOSPITAL_COMMUNITY)
Admission: RE | Admit: 2017-09-29 | Discharge: 2017-09-29 | Disposition: A | Discharge status: Home / Self Care | Payer: BLUE CROSS/BLUE SHIELD | Source: Ambulatory Visit | Attending: Gastroenterology | Admitting: Gastroenterology

## 2017-09-29 ENCOUNTER — Ambulatory Visit (HOSPITAL_COMMUNITY)
Admission: RE | Admit: 2017-09-29 | Discharge: 2017-09-29 | Disposition: A | Discharge status: Home / Self Care | Payer: BLUE CROSS/BLUE SHIELD | Source: Ambulatory Visit | Attending: Family Medicine | Admitting: Family Medicine

## 2017-09-29 ENCOUNTER — Other Ambulatory Visit: Payer: Self-pay

## 2017-09-29 ENCOUNTER — Encounter (HOSPITAL_COMMUNITY): Payer: Self-pay

## 2017-09-29 ENCOUNTER — Ambulatory Visit (HOSPITAL_COMMUNITY): Payer: BLUE CROSS/BLUE SHIELD

## 2017-09-29 ENCOUNTER — Telehealth: Payer: Self-pay

## 2017-09-29 DIAGNOSIS — R05 Cough: Secondary | ICD-10-CM | POA: Diagnosis not present

## 2017-09-29 DIAGNOSIS — Z87891 Personal history of nicotine dependence: Secondary | ICD-10-CM | POA: Insufficient documentation

## 2017-09-29 DIAGNOSIS — D860 Sarcoidosis of lung: Secondary | ICD-10-CM

## 2017-09-29 HISTORY — DX: Gastro-esophageal reflux disease without esophagitis: K21.9

## 2017-09-29 LAB — COMPREHENSIVE METABOLIC PANEL
ALT: 24 U/L (ref 0–44)
AST: 20 U/L (ref 15–41)
Albumin: 3.7 g/dL (ref 3.5–5.0)
Alkaline Phosphatase: 91 U/L (ref 38–126)
Anion gap: 10 (ref 5–15)
BUN: 7 mg/dL (ref 6–20)
CO2: 25 mmol/L (ref 22–32)
Calcium: 8.8 mg/dL — ABNORMAL LOW (ref 8.9–10.3)
Chloride: 103 mmol/L (ref 98–111)
Creatinine, Ser: 0.7 mg/dL (ref 0.44–1.00)
GFR calc Af Amer: >60 mL/min (ref >60)
GFR calc non Af Amer: >60 mL/min (ref >60)
Glucose, Bld: 158 mg/dL — ABNORMAL HIGH (ref 70–99)
Potassium: 3.8 mmol/L (ref 3.5–5.1)
Sodium: 138 mmol/L (ref 135–145)
Total Bilirubin: 0.5 mg/dL (ref 0.3–1.2)
Total Protein: 7 g/dL (ref 6.5–8.1)

## 2017-09-29 LAB — CBC WITH DIFFERENTIAL/PLATELET
Basophils Absolute: 0 10*3/uL (ref 0.0–0.1)
Basophils Relative: 0 %
Eosinophils Absolute: 0.2 10*3/uL (ref 0.0–0.7)
Eosinophils Relative: 2 %
HCT: 46.7 % — ABNORMAL HIGH (ref 36.0–46.0)
Hemoglobin: 15.7 g/dL — ABNORMAL HIGH (ref 12.0–15.0)
Lymphocytes Relative: 22 %
Lymphs Abs: 2.4 10*3/uL (ref 0.7–4.0)
MCH: 32.6 pg (ref 26.0–34.0)
MCHC: 33.6 g/dL (ref 30.0–36.0)
MCV: 97.1 fL (ref 78.0–100.0)
Monocytes Absolute: 0.4 10*3/uL (ref 0.1–1.0)
Monocytes Relative: 4 %
Neutro Abs: 7.9 10*3/uL — ABNORMAL HIGH (ref 1.7–7.7)
Neutrophils Relative %: 72 %
Platelets: 272 10*3/uL (ref 150–400)
RBC: 4.81 MIL/uL (ref 3.87–5.11)
RDW: 12.1 % (ref 11.5–15.5)
WBC: 10.9 10*3/uL — ABNORMAL HIGH (ref 4.0–10.5)

## 2017-09-29 NOTE — Telephone Encounter (Signed)
Pt called office and LMOVM to clarify TCS prep instructions. She read instructions w/prep. Wanted to clarify is she could have light breakfast day before procedure or follow instructions given to her by our office. Called pt back, advised her to follow instructions our office gave her. Verbalized understanding.

## 2017-09-29 NOTE — Patient Instructions (Addendum)
Mary Dominguez  09/29/2017   Your procedure is scheduled on: 10-08-17  Report to [facility] Main  Entrance  Report to admitting at 1200 PM    Call this number if you have problems the morning of surgery [phone]   Remember: Do not eat food:After Midnight. CLEAR LIQUIDS FROM MIDNIGHT UNTIL 830 AM DAY OF SURGERY, NOTHING BY MOUTH AFTER 830 AM DAY OF SURGERY. BRUSH YOUR TEETH MORNING OF SURGERY AND RINSE YOUR MOUTH OUT, NO CHEWING GUM CANDY OR MINTS.    CLEAR LIQUID DIET   Foods Allowed                                                                     Foods Excluded  Coffee and tea, regular and decaf                             liquids that you cannot  Plain Jell-O in any flavor                                             see through such as: Fruit ices (not with fruit pulp)                                     milk, soups, orange juice  Iced Popsicles                                    All solid food                                 Cranberry, grape and apple juices Sports drinks like Gatorade Lightly seasoned clear broth or consume(fat free) Sugar, honey syrup  Sample Menu Breakfast                                Lunch                                     Supper Cranberry juice                    Beef broth                            Chicken broth Jell-O                                     Grape juice  Apple juice Coffee or tea                        Jell-O                                      Popsicle                                                Coffee or tea                        Coffee or tea  _____________________________________________________________________    Eat a light diet the day before surgery.  Examples including soups, broths, toast, yogurt, mashed potatoes.  Things to avoid include carbonated beverages (fizzy beverages), raw fruits and raw vegetables, or beans.   If your bowels are filled with gas, your  surgeon will have difficulty visualizing your pelvic organs which increases your surgical risks.   Take these medicines the morning of surgery with A SIP OF WATER: OMEPRAZOLE (PRILOSEC)                                You may not have any metal on your body including hair pins and              piercings  Do not wear jewelry, make-up, lotions, powders or perfumes, deodorant             Do not wear nail polish.  Do not shave  48 hours prior to surgery.              Men may shave face and neck.   Do not bring valuables to the hospital. [place].  Contacts, dentures or bridgework may not be worn into surgery.  Leave suitcase in the car. After surgery it may be brought to your room.     Patients discharged the day of surgery will not be allowed to drive home.  Name and phone number of your driver: Lucretia Field CELL [phone]  Special Instructions: N/A              Please read over the following fact sheets you were given: _____________________________________________________________________   [facility] - Preparing for Surgery Before surgery, you can play an important role.  Because skin is not sterile, your skin needs to be as free of germs as possible.  You can reduce the number of germs on your skin by washing with CHG (chlorahexidine gluconate) soap before surgery.  CHG is an antiseptic cleaner which kills germs and bonds with the skin to continue killing germs even after washing. Please DO NOT use if you have an allergy to CHG or antibacterial soaps.  If your skin becomes reddened/irritated stop using the CHG and inform your nurse when you arrive at Short Stay. Do not shave (including legs and underarms) for at least 48 hours prior to the first CHG shower.  You may shave your face/neck. Please follow these instructions carefully:  1.  Shower with CHG Soap the night before surgery and the  morning of Surgery.  2.  If you choose to wash  your hair, wash your hair first as usual with your  normal  shampoo.  3.  After you shampoo, rinse your hair and body thoroughly to remove the  shampoo.                           4.  Use CHG as you would any other liquid soap.  You can apply chg directly  to the skin and wash                       Gently with a scrungie or clean washcloth.  5.  Apply the CHG Soap to your body ONLY FROM THE NECK DOWN.   Do not use on face/ open                           Wound or open sores. Avoid contact with eyes, ears mouth and genitals (private parts).                       Wash face,  Genitals (private parts) with your normal soap.             6.  Wash thoroughly, paying special attention to the area where your surgery  will be performed.  7.  Thoroughly rinse your body with warm water from the neck down.  8.  DO NOT shower/wash with your normal soap after using and rinsing off  the CHG Soap.                9.  Pat yourself dry with a clean towel.            10.  Wear clean pajamas.            11.  Place clean sheets on your bed the night of your first shower and do not  sleep with pets. Day of Surgery : Do not apply any lotions/deodorants the morning of surgery.  Please wear clean clothes to the hospital/surgery center.  FAILURE TO FOLLOW THESE INSTRUCTIONS MAY RESULT IN THE CANCELLATION OF YOUR SURGERY PATIENT SIGNATURE_________________________________  NURSE SIGNATURE__________________________________  ________________________________________________________________________   Adam Phenix  An incentive spirometer is a tool that can help keep your lungs clear and active. This tool measures how well you are filling your lungs with each breath. Taking long deep breaths may help reverse or decrease the chance of developing breathing (pulmonary) problems (especially infection) following:  A long period of time when you are unable to move or be active. BEFORE THE PROCEDURE   If the spirometer  includes an indicator to show your best effort, your nurse or respiratory therapist will set it to a desired goal.  If possible, sit up straight or lean slightly forward. Try not to slouch.  Hold the incentive spirometer in an upright position. INSTRUCTIONS FOR USE  1. Sit on the edge of your bed if possible, or sit up as far as you can in bed or on a chair. 2. Hold the incentive spirometer in an upright position. 3. Breathe out normally. 4. Place the mouthpiece in your mouth and seal your lips tightly around it. 5. Breathe in slowly and as deeply as possible, raising the piston or the ball toward the top of the column. 6. Hold your breath for 3-5 seconds or for as long as  possible. Allow the piston or ball to fall to the bottom of the column. 7. Remove the mouthpiece from your mouth and breathe out normally. 8. Rest for a few seconds and repeat Steps 1 through 7 at least 10 times every 1-2 hours when you are awake. Take your time and take a few normal breaths between deep breaths. 9. The spirometer may include an indicator to show your best effort. Use the indicator as a goal to work toward during each repetition. 10. After each set of 10 deep breaths, practice coughing to be sure your lungs are clear. If you have an incision (the cut made at the time of surgery), support your incision when coughing by placing a pillow or rolled up towels firmly against it. Once you are able to get out of bed, walk around indoors and cough well. You may stop using the incentive spirometer when instructed by your caregiver.  RISKS AND COMPLICATIONS  Take your time so you do not get dizzy or light-headed.  If you are in pain, you may need to take or ask for pain medication before doing incentive spirometry. It is harder to take a deep breath if you are having pain. AFTER USE  Rest and breathe slowly and easily.  It can be helpful to keep track of a log of your progress. Your caregiver can provide you with a  simple table to help with this. If you are using the spirometer at home, follow these instructions: [place] IF:   You are having difficultly using the spirometer.  You have trouble using the spirometer as often as instructed.  Your pain medication is not giving enough relief while using the spirometer.  You develop fever of 100.5 F (38.1 C) or higher. SEEK IMMEDIATE MEDICAL CARE IF:   You cough up bloody sputum that had not been present before.  You develop fever of 102 F (38.9 C) or greater.  You develop worsening pain at or near the incision site. MAKE SURE YOU:   Understand these instructions.  Will watch your condition.  Will get help right away if you are not doing well or get worse. Document Released: 05/06/2006 Document Revised: 03/18/2011 Document Reviewed: 07/07/2006 ExitCare Patient Information 2014 Exit[employer], [place].   ________________________________________________________________________           WHAT IS A BLOOD TRANSFUSION? Blood Transfusion Information  A transfusion is the replacement of blood or some of its parts. Blood is made up of multiple cells which provide different functions.  Red blood cells carry oxygen and are used for blood loss replacement.  White blood cells fight against infection.  Platelets control bleeding.  Plasma helps clot blood.  Other blood products are available for specialized needs, such as hemophilia or other clotting disorders. BEFORE THE TRANSFUSION  Who gives blood for transfusions?   Healthy volunteers who are fully evaluated to make sure their blood is safe. This is blood bank blood. Transfusion therapy is the safest it has ever been in the practice of medicine. Before blood is taken from a donor, a complete history is taken to make sure that person has no history of diseases nor engages in risky social behavior (examples are intravenous drug use or sexual activity with multiple partners). The donor's  travel history is screened to minimize risk of transmitting infections, such as malaria. The donated blood is tested for signs of infectious diseases, such as HIV and hepatitis. The blood is then tested to be sure it is compatible with  you in order to minimize the chance of a transfusion reaction. If you or a relative donates blood, this is often done in anticipation of surgery and is not appropriate for emergency situations. It takes many days to process the donated blood. RISKS AND COMPLICATIONS Although transfusion therapy is very safe and saves many lives, the main dangers of transfusion include:   Getting an infectious disease.  Developing a transfusion reaction. This is an allergic reaction to something in the blood you were given. Every precaution is taken to prevent this. The decision to have a blood transfusion has been considered carefully by your caregiver before blood is given. Blood is not given unless the benefits outweigh the risks. AFTER THE TRANSFUSION  Right after receiving a blood transfusion, you will usually feel much better and more energetic. This is especially true if your red blood cells have gotten low (anemic). The transfusion raises the level of the red blood cells which carry oxygen, and this usually causes an energy increase.  The nurse administering the transfusion will monitor you carefully for complications. HOME CARE INSTRUCTIONS  No special instructions are needed after a transfusion. You may find your energy is better. Speak with your caregiver about any limitations on activity for underlying diseases you may have. SEEK MEDICAL CARE IF:   Your condition is not improving after your transfusion.  You develop redness or irritation at the intravenous (IV) site. SEEK IMMEDIATE MEDICAL CARE IF:  Any of the following symptoms occur over the next 12 hours:  Shaking chills.  You have a temperature by mouth above 102 F (38.9 C), not controlled by  medicine.  Chest, back, or muscle pain.  People around you feel you are not acting correctly or are confused.  Shortness of breath or difficulty breathing.  Dizziness and fainting.  You get a rash or develop hives.  You have a decrease in urine output.  Your urine turns a dark color or changes to pink, red, or brown. Any of the following symptoms occur over the next 10 days:  You have a temperature by mouth above 102 F (38.9 C), not controlled by medicine.  Shortness of breath.  Weakness after normal activity.  The white part of the eye turns yellow (jaundice).  You have a decrease in the amount of urine or are urinating less often.  Your urine turns a dark color or changes to pink, red, or brown. Document Released: 12/22/1999 Document Revised: 03/18/2011 Document Reviewed: 08/10/2007 [facility] Patient Information 2014 [place], [place].  _______________________________________________________________________

## 2017-09-30 ENCOUNTER — Encounter (HOSPITAL_COMMUNITY): Payer: Self-pay | Admitting: *Deleted

## 2017-09-30 ENCOUNTER — Ambulatory Visit (HOSPITAL_COMMUNITY): Payer: BLUE CROSS/BLUE SHIELD | Admitting: Anesthesiology

## 2017-09-30 ENCOUNTER — Ambulatory Visit (HOSPITAL_COMMUNITY)
Admission: RE | Admit: 2017-09-30 | Discharge: 2017-09-30 | Disposition: A | Discharge status: Home / Self Care | Payer: BLUE CROSS/BLUE SHIELD | Source: Ambulatory Visit | Attending: Gastroenterology | Admitting: Gastroenterology

## 2017-09-30 ENCOUNTER — Encounter (HOSPITAL_COMMUNITY)
Admission: RE | Disposition: A | Discharge status: Home / Self Care | Payer: Self-pay | Source: Ambulatory Visit | Attending: Gastroenterology

## 2017-09-30 DIAGNOSIS — Z79899 Other long term (current) drug therapy: Secondary | ICD-10-CM | POA: Diagnosis not present

## 2017-09-30 DIAGNOSIS — D123 Benign neoplasm of transverse colon: Secondary | ICD-10-CM | POA: Insufficient documentation

## 2017-09-30 DIAGNOSIS — K621 Rectal polyp: Secondary | ICD-10-CM | POA: Insufficient documentation

## 2017-09-30 DIAGNOSIS — K219 Gastro-esophageal reflux disease without esophagitis: Secondary | ICD-10-CM | POA: Diagnosis not present

## 2017-09-30 DIAGNOSIS — K5730 Diverticulosis of large intestine without perforation or abscess without bleeding: Secondary | ICD-10-CM | POA: Insufficient documentation

## 2017-09-30 DIAGNOSIS — K644 Residual hemorrhoidal skin tags: Secondary | ICD-10-CM | POA: Insufficient documentation

## 2017-09-30 DIAGNOSIS — Z1211 Encounter for screening for malignant neoplasm of colon: Secondary | ICD-10-CM | POA: Insufficient documentation

## 2017-09-30 DIAGNOSIS — K648 Other hemorrhoids: Secondary | ICD-10-CM | POA: Insufficient documentation

## 2017-09-30 DIAGNOSIS — K635 Polyp of colon: Secondary | ICD-10-CM | POA: Diagnosis not present

## 2017-09-30 DIAGNOSIS — G4700 Insomnia, unspecified: Secondary | ICD-10-CM | POA: Diagnosis not present

## 2017-09-30 DIAGNOSIS — D124 Benign neoplasm of descending colon: Secondary | ICD-10-CM | POA: Insufficient documentation

## 2017-09-30 DIAGNOSIS — F17210 Nicotine dependence, cigarettes, uncomplicated: Secondary | ICD-10-CM | POA: Insufficient documentation

## 2017-09-30 DIAGNOSIS — D120 Benign neoplasm of cecum: Secondary | ICD-10-CM | POA: Insufficient documentation

## 2017-09-30 HISTORY — PX: COLONOSCOPY WITH PROPOFOL: SHX5780

## 2017-09-30 SURGERY — COLONOSCOPY WITH PROPOFOL
Anesthesia: Monitor Anesthesia Care

## 2017-09-30 MED ORDER — PROPOFOL 10 MG/ML IV BOLUS
INTRAVENOUS | Status: DC | PRN
  Administered 2017-09-30: 20 mg via INTRAVENOUS

## 2017-09-30 MED ORDER — PROMETHAZINE HCL 25 MG/ML IJ SOLN: 6.2500 mg | INTRAMUSCULAR | Status: DC | PRN

## 2017-09-30 MED ORDER — MIDAZOLAM HCL 5 MG/5ML IJ SOLN
INTRAMUSCULAR | Status: DC | PRN
  Administered 2017-09-30: 2 mg via INTRAVENOUS

## 2017-09-30 MED ORDER — CHLORHEXIDINE GLUCONATE CLOTH 2 % EX PADS: 6.0000 | MEDICATED_PAD | Freq: Once | CUTANEOUS | Status: DC

## 2017-09-30 MED ORDER — HYDROMORPHONE HCL 1 MG/ML IJ SOLN: 0.2500 mg | INTRAMUSCULAR | Status: DC | PRN

## 2017-09-30 MED ORDER — PROPOFOL 10 MG/ML IV BOLUS
INTRAVENOUS | Status: AC
  Filled 2017-09-30: qty 40

## 2017-09-30 MED ORDER — HYDROCODONE-ACETAMINOPHEN 7.5-325 MG PO TABS: 1.0000 | ORAL_TABLET | Freq: Once | ORAL | Status: DC | PRN

## 2017-09-30 MED ORDER — LACTATED RINGERS IV SOLN: INTRAVENOUS | Status: DC

## 2017-09-30 MED ORDER — LACTATED RINGERS IV SOLN
INTRAVENOUS | Status: DC
  Administered 2017-09-30: 14:00:00 via INTRAVENOUS

## 2017-09-30 MED ORDER — MEPERIDINE HCL 100 MG/ML IJ SOLN: 6.2500 mg | INTRAMUSCULAR | Status: DC | PRN

## 2017-09-30 MED ORDER — MIDAZOLAM HCL 2 MG/2ML IJ SOLN
INTRAMUSCULAR | Status: AC
  Filled 2017-09-30: qty 2

## 2017-09-30 MED ORDER — PROPOFOL 500 MG/50ML IV EMUL
INTRAVENOUS | Status: DC | PRN
  Administered 2017-09-30: 150 ug/kg/min via INTRAVENOUS
  Administered 2017-09-30: 16:00:00 via INTRAVENOUS

## 2017-09-30 MED ORDER — STERILE WATER FOR IRRIGATION IR SOLN
Status: DC | PRN
  Administered 2017-09-30: 100 mL

## 2017-09-30 NOTE — Op Note (Signed)
[facility] Patient Name: Mary Dominguez Procedure Date: 09/30/2017 3:04 PM MRN: [id number] Date of Birth: 1965-05-27 Attending MD: Barney Drain MD, MD CSN: [id number] Age: 52 Admit Type: Outpatient Procedure:                Colonoscopy with COLD SNARE/FORCEPS & SNARE CAUTERY                            POLYPECTOMY Indications:              Screening for colorectal malignant neoplasm Providers:                Barney Drain MD, MD, Rosina Lowenstein, RN, Randa Spike, Technician Referring MD:             Elmo Putt, NP Medicines:                Propofol per Anesthesia Complications:            No immediate complications. Estimated Blood Loss:     Estimated blood loss was minimal. Procedure:                Pre-Anesthesia Assessment:                           - Prior to the procedure, a History and Physical                            was performed, and patient medications and                            allergies were reviewed. The patient's tolerance of                            previous anesthesia was also reviewed. The risks                            and benefits of the procedure and the sedation                            options and risks were discussed with the patient.                            All questions were answered, and informed consent                            was obtained. Prior Anticoagulants: The patient has                            taken no previous anticoagulant or antiplatelet                            agents. ASA Grade Assessment: II - A patient with  mild systemic disease. After reviewing the risks                            and benefits, the patient was deemed in                            satisfactory condition to undergo the procedure.                            After obtaining informed consent, the colonoscope                            was passed under direct vision. Throughout the          procedure, the patient's blood pressure, pulse, and                            oxygen saturations were monitored continuously. The                            CF-HQ190L ([id number]) scope was introduced through                            the anus and advanced to the the cecum, identified                            by appendiceal orifice and ileocecal valve. The                            colonoscopy was somewhat difficult due to a                            tortuous colon. Successful completion of the                            procedure was aided by straightening and shortening                            the scope to obtain bowel loop reduction and                            COLOWRAP. The patient tolerated the procedure well.                            The quality of the bowel preparation was excellent.                            The ileocecal valve, appendiceal orifice, and                            rectum were photographed. Scope In: 3:34:34 PM Scope Out: 3:58:13 PM Scope Withdrawal Time: 0 hours 18 minutes 45 seconds  Total Procedure Duration: 0 hours 23 minutes 39 seconds  Findings:      Six sessile polyps were found  in the rectum, descending colon and       transverse colon. The polyps were 4 to 5 mm in size. These polyps were       removed with a cold snare. Resection and retrieval were complete.      A 2 mm polyp was found in the cecum. The polyp was sessile. The polyp       was removed with a cold biopsy forceps. Resection and retrieval were       complete.      A 6 mm polyp was found in the hepatic flexure. The polyp was sessile.       The polyp was removed with a hot snare. Resection and retrieval were       complete.      Multiple small and large-mouthed diverticula were found in the       recto-sigmoid colon, sigmoid colon and descending colon.      External and internal hemorrhoids were found. The hemorrhoids were small. Impression:               - Six 4 to 5 mm polyps  in the rectum(4), in the                            descending colon and in the transverse colon,                            removed with a cold snare. Resected and retrieved.                           - One 2 mm polyp in the cecum, removed with a cold                            biopsy forceps. Resected and retrieved.                           - One 6 mm polyp at the hepatic flexure, removed                            with a hot snare. Resected and retrieved.                           - MODERATE Diverticulosis in the recto-sigmoid                            colon, in the sigmoid colon and in the descending                            colon.                           - SMALL External and internal hemorrhoids. Moderate Sedation:      Per Anesthesia Care Recommendation:           - Patient has a contact number available for                            emergencies. The signs and symptoms of potential  delayed complications were discussed with the                            patient. Return to normal activities tomorrow.                            Written discharge instructions were provided to the                            patient.                           - High fiber diet.                           - Continue present medications.                           - Await pathology results.                           - Repeat colonoscopy date to be determined after                            pending pathology results are reviewed for                            surveillance based on pathology results. Procedure Code(s):        --- Professional ---                           [id number], Colonoscopy, flexible; with removal of                            tumor(s), polyp(s), or other lesion(s) by snare                            technique                           45380, 79, Colonoscopy, flexible; with biopsy,                            single or multiple Diagnosis Code(s):        ---  Professional ---                           Z12.11, Encounter for screening for malignant                            neoplasm of colon                           K62.1, Rectal polyp                           D12.4, Benign neoplasm of descending colon  D12.3, Benign neoplasm of transverse colon (hepatic                            flexure or splenic flexure)                           D12.0, Benign neoplasm of cecum                           K64.8, Other hemorrhoids                           K57.30, Diverticulosis of large intestine without                            perforation or abscess without bleeding CPT copyright 2017 [employer]. All rights reserved. The codes documented in this report are preliminary and upon coder review may  be revised to meet current compliance requirements. Barney Drain, MD Barney Drain MD, MD 09/30/2017 4:14:11 PM This report has been signed electronically. Number of Addenda: 0

## 2017-09-30 NOTE — Discharge Instructions (Addendum)
You have small internal hemorrhoids and diverticulosis IN YOUR LEFT COLON. YOU HAD EIGHT SMALL POLYPSREMOVED.    DRINK WATER TO KEEP YOUR URINE LIGHT YELLOW.  FOLLOW A HIGH FIBER DIET. AVOID ITEMS THAT CAUSE BLOATING. See info below.  TO REDUCE YOUR RISK OF COLON CANCER, TRY TO CUT DOWN AND THEN QUIT SMOKING. THEY ARE DIRECTLY RELATED.   USE PREPARATION H FOUR TIMES  A DAY IF NEEDED TO RELIEVE RECTAL PAIN/PRESSURE/BLEEDING.   YOUR BIOPSY RESULTS WILL BE BACK IN 5 BUSINESS DAYS.  Next colonoscopy in 3-10 years.  Colonoscopy Care After Read the instructions outlined below and refer to this sheet in the next week. These discharge instructions provide you with general information on caring for yourself after you leave the hospital. While your treatment has been planned according to the most current medical practices available, unavoidable complications occasionally occur. If you have any problems or questions after discharge, call DR. FIELDS, [phone].  ACTIVITY  You may resume your regular activity, but move at a slower pace for the next 24 hours.   Take frequent rest periods for the next 24 hours.   Walking will help get rid of the air and reduce the bloated feeling in your belly (abdomen).   No driving for 24 hours (because of the medicine (anesthesia) used during the test).   You may shower.   Do not sign any important legal documents or operate any machinery for 24 hours (because of the anesthesia used during the test).    NUTRITION  Drink plenty of fluids.   You may resume your normal diet as instructed by your doctor.   Begin with a light meal and progress to your normal diet. Heavy or fried foods are harder to digest and may make you feel sick to your stomach (nauseated).   Avoid alcoholic beverages for 24 hours or as instructed.    MEDICATIONS  You may resume your normal medications.   WHAT YOU CAN EXPECT TODAY  Some feelings of bloating in the abdomen.     Passage of more gas than usual.   Spotting of blood in your stool or on the toilet paper  .  IF YOU HAD POLYPS REMOVED DURING THE COLONOSCOPY:  Eat a soft diet IF YOU HAVE NAUSEA, BLOATING, ABDOMINAL PAIN, OR VOMITING.    FINDING OUT THE RESULTS OF YOUR TEST Not all test results are available during your visit. DR. Oneida Alar WILL CALL YOU WITHIN 14 DAYS OF YOUR PROCEDUE WITH YOUR RESULTS. Do not assume everything is normal if you have not heard from DR. FIELDS, CALL HER OFFICE AT [phone].  SEEK IMMEDIATE MEDICAL ATTENTION AND CALL THE OFFICE: [phone] IF:  You have more than a spotting of blood in your stool.   Your belly is swollen (abdominal distention).   You are nauseated or vomiting.   You have a temperature over 101F.   You have abdominal pain or discomfort that is severe or gets worse throughout the day.  High-Fiber Diet A high-fiber diet changes your normal diet to include more whole grains, legumes, fruits, and vegetables. Changes in the diet involve replacing refined carbohydrates with unrefined foods. The calorie level of the diet is essentially unchanged. The Dietary Reference Intake (recommended amount) for adult males is 38 grams per day. For adult females, it is 25 grams per day. Pregnant and lactating women should consume 28 grams of fiber per day. Fiber is the intact part of a plant that is not broken down during digestion. Functional fiber  is fiber that has been isolated from the plant to provide a beneficial effect in the body. PURPOSE  Increase stool bulk.   Ease and regulate bowel movements.   Lower cholesterol.   REDUCE RISK OF COLON CANCER  INDICATIONS THAT YOU NEED MORE FIBER  Constipation and hemorrhoids.   Uncomplicated diverticulosis (intestine condition) and irritable bowel syndrome.   Weight management.   As a protective measure against hardening of the arteries (atherosclerosis), diabetes, and cancer.   GUIDELINES FOR INCREASING  FIBER IN THE DIET  Start adding fiber to the diet slowly. A gradual increase of about 5 more grams (2 slices of whole-wheat bread, 2 servings of most fruits or vegetables, or 1 bowl of high-fiber cereal) per day is best. Too rapid an increase in fiber may result in constipation, flatulence, and bloating.   Drink enough water and fluids to keep your urine clear or pale yellow. Water, juice, or caffeine-free drinks are recommended. Not drinking enough fluid may cause constipation.   Eat a variety of high-fiber foods rather than one type of fiber.   Try to increase your intake of fiber through using high-fiber foods rather than fiber pills or supplements that contain small amounts of fiber.   The goal is to change the types of food eaten. Do not supplement your present diet with high-fiber foods, but replace foods in your present diet.   INCLUDE A VARIETY OF FIBER SOURCES  Replace refined and processed grains with whole grains, canned fruits with fresh fruits, and incorporate other fiber sources. White rice, white breads, and most bakery goods contain little or no fiber.   Brown whole-grain rice, buckwheat oats, and many fruits and vegetables are all good sources of fiber. These include: broccoli, Brussels sprouts, cabbage, cauliflower, beets, sweet potatoes, white potatoes (skin on), carrots, tomatoes, eggplant, squash, berries, fresh fruits, and dried fruits.   Cereals appear to be the richest source of fiber. Cereal fiber is found in whole grains and bran. Bran is the fiber-rich outer coat of cereal grain, which is largely removed in refining. In whole-grain cereals, the bran remains. In breakfast cereals, the largest amount of fiber is found in those with "bran" in their names. The fiber content is sometimes indicated on the label.   You may need to include additional fruits and vegetables each day.   In baking, for 1 cup white flour, you may use the following substitutions:   1 cup  whole-wheat flour minus 2 tablespoons.   1/2 cup white flour plus 1/2 cup whole-wheat flour.   Polyps, Colon  A polyp is extra tissue that grows inside your body. Colon polyps grow in the large intestine. The large intestine, also called the colon, is part of your digestive system. It is a long, hollow tube at the end of your digestive tract where your body makes and stores stool. Most polyps are not dangerous. They are benign. This means they are not cancerous. But over time, some types of polyps can turn into cancer. Polyps that are smaller than a pea are usually not harmful. But larger polyps could someday become or may already be cancerous. To be safe, doctors remove all polyps and test them.   PREVENTION There is not one sure way to prevent polyps. You might be able to lower your risk of getting them if you:  Eat more fruits and vegetables and less fatty food.   Do not smoke.   Avoid alcohol.   Exercise every day.   Lose weight  if you are overweight.   Eating more calcium and folate can also lower your risk of getting polyps. Some foods that are rich in calcium are milk, cheese, and broccoli. Some foods that are rich in folate are chickpeas, kidney beans, and spinach.    Diverticulosis Diverticulosis is a common condition that develops when small pouches (diverticula) form in the wall of the colon. The risk of diverticulosis increases with age. It happens more often in people who eat a low-fiber diet. Most individuals with diverticulosis have no symptoms. Those individuals with symptoms usually experience belly (abdominal) pain, constipation, or loose stools (diarrhea).  HOME CARE INSTRUCTIONS  Increase the amount of fiber in your diet as directed by your caregiver or dietician. This may reduce symptoms of diverticulosis.   Drink at least 6 to 8 glasses of water each day to prevent constipation.   Try not to strain when you have a bowel movement.   Avoiding nuts and seeds to  prevent complications is NOT NECESSARY.   FOODS HAVING HIGH FIBER CONTENT INCLUDE:  Fruits. Apple, peach, pear, tangerine, raisins, prunes.   Vegetables. Brussels sprouts, asparagus, broccoli, cabbage, carrot, cauliflower, romaine lettuce, spinach, summer squash, tomato, winter squash, zucchini.   Starchy Vegetables. Baked beans, kidney beans, lima beans, split peas, lentils, potatoes (with skin).   Grains. Whole wheat bread, brown rice, bran flake cereal, plain oatmeal, white rice, shredded wheat, bran muffins.   SEEK IMMEDIATE MEDICAL CARE IF:  You develop increasing pain or severe bloating.   You have an oral temperature above 101F.   You develop vomiting or bowel movements that are bloody or black.

## 2017-09-30 NOTE — H&P (Signed)
Primary Care Physician:  Susy Frizzle, MD Primary Gastroenterologist:  Dr. Oneida Alar  Pre-Procedure History & Physical: HPI:  Mary Dominguez is a 52 y.o. female here for [place].  Past Medical History:  Diagnosis Date  . Bronchitis   . Bronchitis   . Former smoker   . GERD (gastroesophageal reflux disease)   . Hernia, umbilical   . Insomnia     Past Surgical History:  Procedure Laterality Date  . APPENDECTOMY    . OOPHORECTOMY     USO 2002  . VAGINAL HYSTERECTOMY     dysplasia and AUB    Prior to Admission medications   Medication Sig Start Date End Date Taking? Authorizing Provider  ALPRAZolam Duanne Moron) 0.5 MG tablet Take 1 tablet (0.5 mg total) by mouth daily as needed. Patient taking differently: Take 0.5 mg by mouth at bedtime as needed for sleep.  08/11/17  Yes Susy Frizzle, MD  estrogens, conjugated, (PREMARIN) 0.625 MG tablet TAKE 2 TABLETS BY MOUTH EVERY DAY Patient taking differently: Take 1.25 mg by mouth daily at 12 noon.  04/30/17  Yes Susy Frizzle, MD  omeprazole (PRILOSEC) 40 MG capsule TAKE 1 CAPSULE BY MOUTH EVERY DAY Patient taking differently: Take 40 mg by mouth daily before breakfast. TAKE 1 CAPSULE BY MOUTH EVERY DAY 06/11/17  Yes Susy Frizzle, MD  albuterol (PROVENTIL HFA;VENTOLIN HFA) 108 (90 Base) MCG/ACT inhaler Inhale 2 puffs into the lungs every 4 (four) hours as needed for wheezing or shortness of breath. Patient not taking: Reported on 09/25/2017 09/17/17   Delsa Grana, PA-C  azithromycin (ZITHROMAX) 250 MG tablet Take (2) tablets by mouth on day 1, then take (1) tablet by mouth on days 2-5. Patient taking differently: Take 250-500 mg by mouth See admin instructions. Take (2) tablets by mouth on day 1, then take (1) tablet by mouth on days 2-5. 09/22/17   Susy Frizzle, MD    Allergies as of 09/25/2017 - Review Complete 09/25/2017  Allergen Reaction Noted  . Penicillins Shortness Of Breath 04/18/2012  . Ivp dye  [iodinated diagnostic agents] Itching 08/29/2017  . Levaquin [levofloxacin in d5w] Rash 01/23/2015    Family History  Problem Relation Age of Onset  . Cancer Neg Hx        no breast, ovarian, or colon cancer  . Colon polyps Neg Hx     Social History   Socioeconomic History  . Marital status: Married    Spouse name: Not on file  . Number of children: Not on file  . Years of education: Not on file  . Highest education level: Not on file  Occupational History  . Not on file  Social Needs  . Financial resource strain: Not on file  . Food insecurity:    Worry: Not on file    Inability: Not on file  . Transportation needs:    Medical: Not on file    Non-medical: Not on file  Tobacco Use  . Smoking status: Current Every Day Smoker    Packs/day: 0.75    Types: Cigarettes  . Smokeless tobacco: Never Used  Substance and Sexual Activity  . Alcohol use: Yes    Comment: twice a week  . Drug use: No  . Sexual activity: Yes  Lifestyle  . Physical activity:    Days per week: Not on file    Minutes per session: Not on file  . Stress: Not on file  Relationships  . Social connections:  Talks on phone: Not on file    Gets together: Not on file    Attends religious service: Not on file    Active member of club or organization: Not on file    Attends meetings of clubs or organizations: Not on file    Relationship status: Not on file  . Intimate partner violence:    Fear of current or ex partner: Not on file    Emotionally abused: Not on file    Physically abused: Not on file    Forced sexual activity: Not on file  Other Topics Concern  . Not on file  Social History Narrative  . Not on file    Review of Systems: See HPI, otherwise negative ROS   Physical Exam: BP (!) 145/86   Pulse 75   Temp 98.6 F (37 C) (Oral)   Resp 18   SpO2 98%  General:   Alert,  pleasant and cooperative in NAD Head:  Normocephalic and atraumatic. Neck:  Supple; Lungs:  Clear throughout  to auscultation.    Heart:  Regular rate and rhythm. Abdomen:  Soft, nontender and nondistended. Normal bowel sounds, without guarding, and without rebound.   Neurologic:  Alert and  oriented x4;  grossly normal neurologically.  Impression/Plan:    SCREENING  Plan:  1. TCS TODAY DISCUSSED PROCEDURE, BENEFITS, & RISKS: < 1% chance of medication reaction, bleeding, perforation, or rupture of spleen/liver.

## 2017-09-30 NOTE — Transfer of Care (Signed)
Immediate Anesthesia Transfer of Care Note  Patient: Mary Dominguez  Procedure(s) Performed: COLONOSCOPY WITH PROPOFOL (N/A )  Patient Location: PACU  Anesthesia Type:MAC  Level of Consciousness: awake, alert  and patient cooperative  Airway & Oxygen Therapy: Patient Spontanous Breathing  Post-op Assessment: Report given to RN, Post -op Vital signs reviewed and stable and Patient moving all extremities  Post vital signs: Reviewed and stable  Last Vitals:  Vitals Value Taken Time  BP    Temp    Pulse    Resp 31 09/30/2017  4:06 PM  SpO2    Vitals shown include unvalidated device data.  Last Pain:  Vitals:   09/30/17 1525  TempSrc:   PainSc: 0-No pain      Patients Stated Pain Goal: 5 (69/45/03 8882)  Complications: No apparent anesthesia complications

## 2017-09-30 NOTE — Anesthesia Preprocedure Evaluation (Signed)
Anesthesia Evaluation  Patient identified by MRN, date of birth, ID band Patient awake    Reviewed: Allergy & Precautions, H&P , NPO status , Patient's Chart, lab work & pertinent test results, reviewed documented beta blocker date and time   Airway Mallampati: II  TM Distance: >3 FB Neck ROM: full    Dental no notable dental hx. (+) Teeth Intact, Dental Advidsory Given   Pulmonary neg pulmonary ROS, Current Smoker,    Pulmonary exam normal breath sounds clear to auscultation       Cardiovascular Exercise Tolerance: Good negative cardio ROS   Rhythm:regular Rate:Normal     Neuro/Psych negative neurological ROS  negative psych ROS   GI/Hepatic negative GI ROS, Neg liver ROS, GERD  ,  Endo/Other  negative endocrine ROS  Renal/GU negative Renal ROS  negative genitourinary   Musculoskeletal   Abdominal   Peds  Hematology negative hematology ROS (+)   Anesthesia Other Findings Pelvic mass, elevated CEA for c-scope Bronchitis tx 3 weeks ago, lingering slight cough  Reproductive/Obstetrics negative OB ROS                             Anesthesia Physical Anesthesia Plan  ASA: II  Anesthesia Plan: MAC   Post-op Pain Management:    Induction:   PONV Risk Score and Plan:   Airway Management Planned:   Additional Equipment:   Intra-op Plan:   Post-operative Plan:   Informed Consent: I have reviewed the patients History and Physical, chart, labs and discussed the procedure including the risks, benefits and alternatives for the proposed anesthesia with the patient or authorized representative who has indicated his/her understanding and acceptance.   Dental Advisory Given  Plan Discussed with: CRNA and Anesthesiologist  Anesthesia Plan Comments:         Anesthesia Quick Evaluation

## 2017-09-30 NOTE — Anesthesia Postprocedure Evaluation (Signed)
Anesthesia Post Note  Patient: Mary Dominguez  Procedure(s) Performed: COLONOSCOPY WITH PROPOFOL (N/A )  Patient location during evaluation: PACU Anesthesia Type: MAC Level of consciousness: awake and alert and patient cooperative Pain management: pain level controlled Vital Signs Assessment: post-procedure vital signs reviewed and stable Respiratory status: spontaneous breathing, nonlabored ventilation and respiratory function stable Cardiovascular status: blood pressure returned to baseline Postop Assessment: no apparent nausea or vomiting Anesthetic complications: no     Last Vitals:  Vitals:   09/30/17 1328  BP: (!) 145/86  Pulse: 75  Resp: 18  Temp: 37 C  SpO2: 98%    Last Pain:  Vitals:   09/30/17 1525  TempSrc:   PainSc: 0-No pain                 Cristiano Capri J

## 2017-10-01 NOTE — Progress Notes (Signed)
CC'D TO PCP °

## 2017-10-03 ENCOUNTER — Encounter (HOSPITAL_COMMUNITY): Payer: Self-pay

## 2017-10-03 ENCOUNTER — Other Ambulatory Visit: Payer: Self-pay

## 2017-10-03 ENCOUNTER — Encounter (HOSPITAL_COMMUNITY)
Admission: RE | Admit: 2017-10-03 | Discharge: 2017-10-03 | Disposition: A | Discharge status: Home / Self Care | Payer: BLUE CROSS/BLUE SHIELD | Source: Ambulatory Visit | Attending: Obstetrics | Admitting: Obstetrics

## 2017-10-03 DIAGNOSIS — Z01812 Encounter for preprocedural laboratory examination: Secondary | ICD-10-CM | POA: Diagnosis not present

## 2017-10-03 HISTORY — DX: Other specified conditions associated with female genital organs and menstrual cycle: N94.89

## 2017-10-03 LAB — COMPREHENSIVE METABOLIC PANEL
ALT: 25 U/L (ref 0–44)
AST: 24 U/L (ref 15–41)
Albumin: 4 g/dL (ref 3.5–5.0)
Alkaline Phosphatase: 80 U/L (ref 38–126)
Anion gap: 8 (ref 5–15)
BUN: 12 mg/dL (ref 6–20)
CO2: 29 mmol/L (ref 22–32)
Calcium: 9.2 mg/dL (ref 8.9–10.3)
Chloride: 106 mmol/L (ref 98–111)
Creatinine, Ser: 0.72 mg/dL (ref 0.44–1.00)
GFR calc Af Amer: >60 mL/min (ref >60)
GFR calc non Af Amer: >60 mL/min (ref >60)
Glucose, Bld: 95 mg/dL (ref 70–99)
Potassium: 4.3 mmol/L (ref 3.5–5.1)
Sodium: 143 mmol/L (ref 135–145)
Total Bilirubin: 0.5 mg/dL (ref 0.3–1.2)
Total Protein: 7 g/dL (ref 6.5–8.1)

## 2017-10-03 LAB — URINALYSIS, ROUTINE W REFLEX MICROSCOPIC
Bilirubin Urine: NEGATIVE
Glucose, UA: NEGATIVE mg/dL
Hgb urine dipstick: NEGATIVE
Ketones, ur: 5 mg/dL — AB
Leukocytes, UA: NEGATIVE
Nitrite: NEGATIVE
Protein, ur: NEGATIVE mg/dL
Specific Gravity, Urine: 1.006 (ref 1.005–1.030)
pH: 6 (ref 5.0–8.0)

## 2017-10-03 LAB — CBC
HCT: 46.3 % — ABNORMAL HIGH (ref 36.0–46.0)
Hemoglobin: 15.4 g/dL — ABNORMAL HIGH (ref 12.0–15.0)
MCH: 32.1 pg (ref 26.0–34.0)
MCHC: 33.3 g/dL (ref 30.0–36.0)
MCV: 96.5 fL (ref 78.0–100.0)
Platelets: 295 10*3/uL (ref 150–400)
RBC: 4.8 MIL/uL (ref 3.87–5.11)
RDW: 12 % (ref 11.5–15.5)
WBC: 10.3 10*3/uL (ref 4.0–10.5)

## 2017-10-03 NOTE — Progress Notes (Signed)
SPOKE WITH MELISSA CROSS NP DO CBC AND CMET WITH PRE OP LABS TODAY.

## 2017-10-03 NOTE — Progress Notes (Signed)
CBC, CMET 09-29-17 Epic CHEST XRAY 09-29-17 EPIC

## 2017-10-04 LAB — ABO/RH: ABO/RH(D): AB POS

## 2017-10-06 NOTE — Progress Notes (Signed)
PT is aware.

## 2017-10-07 ENCOUNTER — Ambulatory Visit: Payer: BLUE CROSS/BLUE SHIELD | Admitting: Family Medicine

## 2017-10-07 ENCOUNTER — Encounter (HOSPITAL_COMMUNITY): Payer: Self-pay | Admitting: Gastroenterology

## 2017-10-07 NOTE — Progress Notes (Signed)
CC'D TO PCP °

## 2017-10-08 ENCOUNTER — Encounter (HOSPITAL_COMMUNITY)
Admission: RE | Disposition: A | Discharge status: Home / Self Care | Payer: Self-pay | Source: Ambulatory Visit | Attending: Obstetrics

## 2017-10-08 ENCOUNTER — Ambulatory Visit (HOSPITAL_COMMUNITY)
Admission: RE | Admit: 2017-10-08 | Discharge: 2017-10-08 | Disposition: A | Discharge status: Home / Self Care | Payer: BLUE CROSS/BLUE SHIELD | Source: Ambulatory Visit | Attending: Obstetrics | Admitting: Obstetrics

## 2017-10-08 ENCOUNTER — Ambulatory Visit (HOSPITAL_COMMUNITY): Payer: BLUE CROSS/BLUE SHIELD | Admitting: Anesthesiology

## 2017-10-08 ENCOUNTER — Encounter (HOSPITAL_COMMUNITY): Payer: Self-pay | Admitting: Emergency Medicine

## 2017-10-08 DIAGNOSIS — Z881 Allergy status to other antibiotic agents status: Secondary | ICD-10-CM | POA: Diagnosis not present

## 2017-10-08 DIAGNOSIS — Z91041 Radiographic dye allergy status: Secondary | ICD-10-CM | POA: Diagnosis not present

## 2017-10-08 DIAGNOSIS — Z880 Allergy status to penicillin: Secondary | ICD-10-CM | POA: Diagnosis not present

## 2017-10-08 DIAGNOSIS — N838 Other noninflammatory disorders of ovary, fallopian tube and broad ligament: Secondary | ICD-10-CM | POA: Insufficient documentation

## 2017-10-08 DIAGNOSIS — Z79899 Other long term (current) drug therapy: Secondary | ICD-10-CM | POA: Insufficient documentation

## 2017-10-08 DIAGNOSIS — K219 Gastro-esophageal reflux disease without esophagitis: Secondary | ICD-10-CM | POA: Diagnosis not present

## 2017-10-08 DIAGNOSIS — N839 Noninflammatory disorder of ovary, fallopian tube and broad ligament, unspecified: Secondary | ICD-10-CM | POA: Diagnosis not present

## 2017-10-08 DIAGNOSIS — Z885 Allergy status to narcotic agent status: Secondary | ICD-10-CM | POA: Insufficient documentation

## 2017-10-08 DIAGNOSIS — D271 Benign neoplasm of left ovary: Secondary | ICD-10-CM | POA: Diagnosis not present

## 2017-10-08 DIAGNOSIS — F17200 Nicotine dependence, unspecified, uncomplicated: Secondary | ICD-10-CM | POA: Diagnosis not present

## 2017-10-08 DIAGNOSIS — R1909 Other intra-abdominal and pelvic swelling, mass and lump: Secondary | ICD-10-CM | POA: Diagnosis not present

## 2017-10-08 DIAGNOSIS — N9489 Other specified conditions associated with female genital organs and menstrual cycle: Secondary | ICD-10-CM

## 2017-10-08 HISTORY — PX: ROBOTIC ASSISTED SALPINGO OOPHERECTOMY: SHX6082

## 2017-10-08 LAB — TYPE AND SCREEN
ABO/RH(D): AB POS
Antibody Screen: NEGATIVE

## 2017-10-08 SURGERY — SALPINGO-OOPHORECTOMY, ROBOT-ASSISTED
Anesthesia: General | Laterality: Left

## 2017-10-08 MED ORDER — SODIUM CHLORIDE 0.9 % IR SOLN
Status: DC | PRN
  Administered 2017-10-08: 1000 mL

## 2017-10-08 MED ORDER — FENTANYL CITRATE (PF) 100 MCG/2ML IJ SOLN
INTRAMUSCULAR | Status: DC | PRN
  Administered 2017-10-08 (×2): 50 ug via INTRAVENOUS
  Administered 2017-10-08: 100 ug via INTRAVENOUS
  Administered 2017-10-08: 50 ug via INTRAVENOUS

## 2017-10-08 MED ORDER — HYDROCODONE-ACETAMINOPHEN 5-325 MG PO TABS: 1.0000 | ORAL_TABLET | ORAL | Status: DC | PRN

## 2017-10-08 MED ORDER — CELECOXIB 200 MG PO CAPS
400.0000 mg | ORAL_CAPSULE | ORAL | Status: AC
  Administered 2017-10-08: 400 mg via ORAL
  Filled 2017-10-08: qty 2

## 2017-10-08 MED ORDER — ACETAMINOPHEN 500 MG PO TABS
1000.0000 mg | ORAL_TABLET | ORAL | Status: AC
  Administered 2017-10-08: 1000 mg via ORAL
  Filled 2017-10-08: qty 2

## 2017-10-08 MED ORDER — IBUPROFEN 800 MG PO TABS: 800.0000 mg | ORAL_TABLET | Freq: Three times a day (TID) | ORAL | 0 refills | Status: DC | PRN

## 2017-10-08 MED ORDER — KETOROLAC TROMETHAMINE 30 MG/ML IJ SOLN: 30.0000 mg | Freq: Four times a day (QID) | INTRAMUSCULAR | Status: DC

## 2017-10-08 MED ORDER — DEXAMETHASONE SODIUM PHOSPHATE 10 MG/ML IJ SOLN
INTRAMUSCULAR | Status: AC
  Filled 2017-10-08: qty 1

## 2017-10-08 MED ORDER — KETOROLAC TROMETHAMINE 30 MG/ML IJ SOLN
INTRAMUSCULAR | Status: DC | PRN
  Administered 2017-10-08: 30 mg via INTRAVENOUS

## 2017-10-08 MED ORDER — CEFAZOLIN SODIUM-DEXTROSE 2-4 GM/100ML-% IV SOLN
2.0000 g | INTRAVENOUS | Status: AC
  Administered 2017-10-08: 2 g via INTRAVENOUS
  Filled 2017-10-08: qty 100

## 2017-10-08 MED ORDER — FENTANYL CITRATE (PF) 250 MCG/5ML IJ SOLN
INTRAMUSCULAR | Status: AC
  Filled 2017-10-08: qty 5

## 2017-10-08 MED ORDER — SUGAMMADEX SODIUM 200 MG/2ML IV SOLN
INTRAVENOUS | Status: DC | PRN
  Administered 2017-10-08: 200 mg via INTRAVENOUS

## 2017-10-08 MED ORDER — MIDAZOLAM HCL 5 MG/5ML IJ SOLN
INTRAMUSCULAR | Status: DC | PRN
  Administered 2017-10-08: 2 mg via INTRAVENOUS

## 2017-10-08 MED ORDER — LACTATED RINGERS IV SOLN
INTRAVENOUS | Status: DC
  Administered 2017-10-08: 13:00:00 via INTRAVENOUS

## 2017-10-08 MED ORDER — PROPOFOL 10 MG/ML IV BOLUS
INTRAVENOUS | Status: AC
  Filled 2017-10-08: qty 20

## 2017-10-08 MED ORDER — BUPIVACAINE-EPINEPHRINE (PF) 0.5% -1:200000 IJ SOLN
INTRAMUSCULAR | Status: AC
  Filled 2017-10-08: qty 30

## 2017-10-08 MED ORDER — HYDROMORPHONE HCL 1 MG/ML IJ SOLN: 0.2500 mg | INTRAMUSCULAR | Status: DC | PRN

## 2017-10-08 MED ORDER — LIDOCAINE HCL (CARDIAC) PF 100 MG/5ML IV SOSY
PREFILLED_SYRINGE | INTRAVENOUS | Status: DC | PRN
  Administered 2017-10-08: 60 mg via INTRAVENOUS

## 2017-10-08 MED ORDER — SUGAMMADEX SODIUM 200 MG/2ML IV SOLN
INTRAVENOUS | Status: AC
  Filled 2017-10-08: qty 2

## 2017-10-08 MED ORDER — EPHEDRINE SULFATE 50 MG/ML IJ SOLN
INTRAMUSCULAR | Status: DC | PRN
  Administered 2017-10-08 (×2): 10 mg via INTRAVENOUS

## 2017-10-08 MED ORDER — SCOPOLAMINE 1 MG/3DAYS TD PT72
1.0000 | MEDICATED_PATCH | TRANSDERMAL | Status: AC
  Administered 2017-10-08: 1.5 mg via TRANSDERMAL
  Administered 2017-10-08: 1 via TRANSDERMAL
  Filled 2017-10-08: qty 1

## 2017-10-08 MED ORDER — MIDAZOLAM HCL 2 MG/2ML IJ SOLN
INTRAMUSCULAR | Status: AC
  Filled 2017-10-08: qty 2

## 2017-10-08 MED ORDER — GABAPENTIN 300 MG PO CAPS
300.0000 mg | ORAL_CAPSULE | ORAL | Status: AC
  Administered 2017-10-08: 300 mg via ORAL
  Filled 2017-10-08: qty 1

## 2017-10-08 MED ORDER — ONDANSETRON HCL 4 MG/2ML IJ SOLN
INTRAMUSCULAR | Status: AC
  Filled 2017-10-08: qty 2

## 2017-10-08 MED ORDER — BUPIVACAINE-EPINEPHRINE (PF) 0.5% -1:200000 IJ SOLN
INTRAMUSCULAR | Status: DC | PRN
  Administered 2017-10-08: 30 mL

## 2017-10-08 MED ORDER — KETOROLAC TROMETHAMINE 30 MG/ML IJ SOLN
INTRAMUSCULAR | Status: AC
  Filled 2017-10-08: qty 1

## 2017-10-08 MED ORDER — DIPHENHYDRAMINE HCL 50 MG/ML IJ SOLN: 25.0000 mg | Freq: Four times a day (QID) | INTRAMUSCULAR | Status: DC | PRN

## 2017-10-08 MED ORDER — HYDROMORPHONE HCL 1 MG/ML IJ SOLN: 0.5000 mg | INTRAMUSCULAR | Status: DC | PRN

## 2017-10-08 MED ORDER — GABAPENTIN 300 MG PO CAPS: 300.0000 mg | ORAL_CAPSULE | Freq: Two times a day (BID) | ORAL | Status: DC

## 2017-10-08 MED ORDER — SENNA 8.6 MG PO TABS: 1.0000 | ORAL_TABLET | Freq: Every evening | ORAL | 0 refills | Status: DC | PRN

## 2017-10-08 MED ORDER — DIPHENHYDRAMINE HCL 25 MG PO CAPS: 25.0000 mg | ORAL_CAPSULE | Freq: Four times a day (QID) | ORAL | Status: DC | PRN

## 2017-10-08 MED ORDER — ONDANSETRON HCL 4 MG/2ML IJ SOLN: 4.0000 mg | Freq: Four times a day (QID) | INTRAMUSCULAR | Status: DC | PRN

## 2017-10-08 MED ORDER — ARTIFICIAL TEARS OPHTHALMIC OINT
TOPICAL_OINTMENT | OPHTHALMIC | Status: AC
  Filled 2017-10-08: qty 3.5

## 2017-10-08 MED ORDER — DEXAMETHASONE SODIUM PHOSPHATE 4 MG/ML IJ SOLN
4.0000 mg | INTRAMUSCULAR | Status: AC
  Administered 2017-10-08: 4 mg via INTRAVENOUS

## 2017-10-08 MED ORDER — ONDANSETRON HCL 4 MG/2ML IJ SOLN
INTRAMUSCULAR | Status: DC | PRN
  Administered 2017-10-08: 4 mg via INTRAVENOUS

## 2017-10-08 MED ORDER — ROCURONIUM BROMIDE 100 MG/10ML IV SOLN
INTRAVENOUS | Status: DC | PRN
  Administered 2017-10-08: 50 mg via INTRAVENOUS

## 2017-10-08 MED ORDER — PROPOFOL 10 MG/ML IV BOLUS
INTRAVENOUS | Status: DC | PRN
  Administered 2017-10-08: 200 mg via INTRAVENOUS

## 2017-10-08 MED ORDER — CELECOXIB 200 MG PO CAPS: 200.0000 mg | ORAL_CAPSULE | Freq: Two times a day (BID) | ORAL | Status: DC

## 2017-10-08 MED ORDER — HYDROCODONE-ACETAMINOPHEN 5-325 MG PO TABS: 1.0000 | ORAL_TABLET | Freq: Four times a day (QID) | ORAL | 0 refills | Status: DC | PRN

## 2017-10-08 SURGICAL SUPPLY — 73 items
ADH SKN CLS APL DERMABOND .7 (GAUZE/BANDAGES/DRESSINGS) ×1
AGENT HMST KT MTR STRL THRMB (HEMOSTASIS)
APL ESCP 34 STRL LF DISP (HEMOSTASIS)
APL SKNCLS STERI-STRIP NONHPOA (GAUZE/BANDAGES/DRESSINGS)
APL SWBSTK 6 STRL LF DISP (MISCELLANEOUS) ×1
APPLICATOR COTTON TIP 6 STRL (MISCELLANEOUS) ×1 IMPLANT
APPLICATOR COTTON TIP 6IN STRL (MISCELLANEOUS) ×2
APPLICATOR SURGIFLO ENDO (HEMOSTASIS) IMPLANT
BAG LAPAROSCOPIC 12 15 PORT 16 (BASKET) IMPLANT
BAG RETRIEVAL 12/15 (BASKET)
BAG SPEC RTRVL LRG 6X4 10 (ENDOMECHANICALS) ×1
BENZOIN TINCTURE PRP APPL 2/3 (GAUZE/BANDAGES/DRESSINGS) ×1 IMPLANT
COVER BACK TABLE 60X90IN (DRAPES) ×2 IMPLANT
COVER TIP SHEARS 8 DVNC (MISCELLANEOUS) ×1 IMPLANT
COVER TIP SHEARS 8MM DA VINCI (MISCELLANEOUS) ×1
DERMABOND ADVANCED (GAUZE/BANDAGES/DRESSINGS) ×1
DERMABOND ADVANCED .7 DNX12 (GAUZE/BANDAGES/DRESSINGS) IMPLANT
DRAPE ARM DVNC X/XI (DISPOSABLE) ×4 IMPLANT
DRAPE COLUMN DVNC XI (DISPOSABLE) ×1 IMPLANT
DRAPE DA VINCI XI ARM (DISPOSABLE) ×4
DRAPE DA VINCI XI COLUMN (DISPOSABLE) ×1
DRAPE SHEET LG 3/4 BI-LAMINATE (DRAPES) ×2 IMPLANT
DRAPE SURG IRRIG POUCH 19X23 (DRAPES) ×2 IMPLANT
DRAPE UNDERBUTTOCKS STRL (DRAPE) ×2 IMPLANT
ELECT REM PT RETURN 15FT ADLT (MISCELLANEOUS) ×2 IMPLANT
GLOVE BIO SURGEON STRL SZ 6 (GLOVE) ×2 IMPLANT
GLOVE BIO SURGEON STRL SZ 6.5 (GLOVE) IMPLANT
GLOVE BIOGEL PI IND STRL 7.0 (GLOVE) ×3 IMPLANT
GLOVE BIOGEL PI INDICATOR 7.0 (GLOVE) ×3
GLOVE SURG SS PI 6.5 STRL IVOR (GLOVE) ×6 IMPLANT
GOWN STRL REUS W/ TWL LRG LVL3 (GOWN DISPOSABLE) ×1 IMPLANT
GOWN STRL REUS W/TWL LRG LVL3 (GOWN DISPOSABLE) ×2
GOWN STRL REUS W/TWL XL LVL3 (GOWN DISPOSABLE) ×4 IMPLANT
GYRUS RUMI II 2.5CM BLUE (DISPOSABLE)
GYRUS RUMI II 3.5CM BLUE (DISPOSABLE)
HOLDER FOLEY CATH W/STRAP (MISCELLANEOUS) ×2 IMPLANT
IRRIG SUCT STRYKERFLOW 2 WTIP (MISCELLANEOUS) ×2
IRRIGATION SUCT STRKRFLW 2 WTP (MISCELLANEOUS) ×1 IMPLANT
KIT PROCEDURE DA VINCI SI (MISCELLANEOUS)
KIT PROCEDURE DVNC SI (MISCELLANEOUS) IMPLANT
MANIPULATOR UTERINE 4.5 ZUMI (MISCELLANEOUS) IMPLANT
NDL SPNL 18GX3.5 QUINCKE PK (NEEDLE) IMPLANT
NEEDLE SPNL 18GX3.5 QUINCKE PK (NEEDLE) IMPLANT
OBTURATOR OPTICAL STANDARD 8MM (TROCAR) ×1
OBTURATOR OPTICAL STND 8 DVNC (TROCAR) ×1
OBTURATOR OPTICALSTD 8 DVNC (TROCAR) ×1 IMPLANT
PACK ROBOT GYN CUSTOM WL (TRAY / TRAY PROCEDURE) ×2 IMPLANT
PAD POSITIONING PINK XL (MISCELLANEOUS) ×2 IMPLANT
POUCH SPECIMEN RETRIEVAL 10MM (ENDOMECHANICALS) ×1 IMPLANT
RUMI II 3.0CM BLUE KOH-EFFICIE (DISPOSABLE) IMPLANT
RUMI II GYRUS 2.5CM BLUE (DISPOSABLE) IMPLANT
RUMI II GYRUS 3.5CM BLUE (DISPOSABLE) IMPLANT
SEAL CANN UNIV 5-8 DVNC XI (MISCELLANEOUS) ×4 IMPLANT
SEAL XI 5MM-8MM UNIVERSAL (MISCELLANEOUS) ×4
SET TRI-LUMEN FLTR TB AIRSEAL (TUBING) ×2 IMPLANT
STRIP CLOSURE SKIN 1/2X4 (GAUZE/BANDAGES/DRESSINGS) ×1 IMPLANT
SURGIFLO W/THROMBIN 8M KIT (HEMOSTASIS) IMPLANT
SUT VIC AB 0 CT1 27 (SUTURE)
SUT VIC AB 0 CT1 27XBRD ANTBC (SUTURE) IMPLANT
SUT VIC AB 4-0 P2 18 (SUTURE) ×4 IMPLANT
SUT VLOC 180 0 9IN  GS21 (SUTURE)
SUT VLOC 180 0 9IN GS21 (SUTURE) ×2 IMPLANT
SYR 10ML LL (SYRINGE) IMPLANT
TIP RUMI ORANGE 6.7MMX12CM (TIP) IMPLANT
TIP UTERINE 5.1X6CM LAV DISP (MISCELLANEOUS) IMPLANT
TIP UTERINE 6.7X10CM GRN DISP (MISCELLANEOUS) IMPLANT
TIP UTERINE 6.7X6CM WHT DISP (MISCELLANEOUS) IMPLANT
TIP UTERINE 6.7X8CM BLUE DISP (MISCELLANEOUS) IMPLANT
TOWEL OR NON WOVEN STRL DISP B (DISPOSABLE) ×2 IMPLANT
TRAP SPECIMEN MUCOUS 40CC (MISCELLANEOUS) ×1 IMPLANT
TRAY FOLEY MTR SLVR 16FR STAT (SET/KITS/TRAYS/PACK) ×2 IMPLANT
UNDERPAD 30X30 (UNDERPADS AND DIAPERS) ×2 IMPLANT
WATER STERILE IRR 1000ML POUR (IV SOLUTION) ×2 IMPLANT

## 2017-10-08 NOTE — Discharge Instructions (Signed)
10/08/2017  Activity: 1. Be up and out of the bed during the day.  Take a nap if needed.  You may walk up steps but be careful and use the hand rail.  Stair climbing will tire you more than you think, you may need to stop part way and rest.   2. No lifting, pulling, pushing, or straining anything more than 5 pounds for 4 weeks. You may be able to return to full activity at 4 weeks, but this will be further discussed at your postoperative visits.  3. No driving for 2 weeks.  Do Not drive if you are taking narcotic pain medicine.  4. Shower daily.  Use soap and water on your incision and pat dry; don't rub.   5. No sexual activity and nothing in the vagina for 4 weeks.  Medications:  - As long as you have never been told to avoid ibuprofen and/or tylenol use these first for pain control. Take these regularly (every 6 hours) to decrease the build up of pain.  - If necessary, for severe pain not relieved by the above, take your pain prescription.  - While taking your prescription pain medication you should take stool softener (I prefer you purchase docusate sodium "Colace") 2-3 times per day to reduce the likelihood of constipation. If this causes diarrhea, stop its use.  Diet: 1. Low sodium Heart Healthy Diet is recommended. 2. It is safe to use a gentle laxative if you have difficulty moving your bowels as long as you have no nausea or vomiting and you are passing flatus.  Wound Care: 1. Keep clean and dry.  Shower daily. 2. If you have steri-strips on your incision these will fall off after 2-3 weeks. At 3 weeks you may take these off carefully after showering.   Reasons to call the Doctor:   Fever - Oral temperature greater than 100.4 degrees Fahrenheit  Foul-smelling vaginal discharge  Difficulty urinating  Nausea and vomiting  Increased pain at the site of the incision that is unrelieved with pain medicine.  Difficulty breathing with or without chest pain  New calf pain  especially if only on one side  Sudden, continuing increased vaginal bleeding/drainage with or without clots.   Follow-up: 1. See Dr. Gerarda Fraction within 2 weeks.  Contacts: For questions or concerns you should contact:  Our office [phone] After hours and on week-ends for urgent issues relating to our treatment for you, please call [phone] and ask to speak to the physician on call for Gynecologic Oncology  We will not refill pain medications if we are not your primary provider or after hours. Plan accordingly if you are running low.

## 2017-10-08 NOTE — Anesthesia Postprocedure Evaluation (Signed)
Anesthesia Post Note  Patient: LAKEN ROG  Procedure(s) Performed: XI ROBOTIC ASSISTED UNILATERAL SALPINGO OOPHORECTOMY (Left )     Patient location during evaluation: PACU Anesthesia Type: General Level of consciousness: awake and alert Pain management: pain level controlled Vital Signs Assessment: post-procedure vital signs reviewed and stable Respiratory status: spontaneous breathing, nonlabored ventilation, respiratory function stable and patient connected to nasal cannula oxygen Cardiovascular status: blood pressure returned to baseline and stable Postop Assessment: no apparent nausea or vomiting Anesthetic complications: no    Last Vitals:  Vitals:   10/08/17 1630 10/08/17 1645  BP: 137/75 133/87  Pulse: 64 65  Resp: 18 18  Temp: (!) 36 C (!) 36.3 C  SpO2: 94% 96%    Last Pain:  Vitals:   10/08/17 1630  TempSrc:   PainSc: 0-No pain                 Wlliam Grosso

## 2017-10-08 NOTE — Anesthesia Preprocedure Evaluation (Signed)
Anesthesia Evaluation  Patient identified by MRN, date of birth, ID band Patient awake    Reviewed: Allergy & Precautions, H&P , NPO status , Patient's Chart, lab work & pertinent test results  Airway Mallampati: II   Neck ROM: full    Dental   Pulmonary Current Smoker,    breath sounds clear to auscultation       Cardiovascular negative cardio ROS   Rhythm:regular Rate:Normal     Neuro/Psych    GI/Hepatic GERD  ,  Endo/Other    Renal/GU      Musculoskeletal   Abdominal   Peds  Hematology   Anesthesia Other Findings   Reproductive/Obstetrics Left adnexal mass                             Anesthesia Physical Anesthesia Plan  ASA: II  Anesthesia Plan: General   Post-op Pain Management:    Induction: Intravenous  PONV Risk Score and Plan: 2 and Ondansetron, Dexamethasone, Midazolam and Treatment may vary due to age or medical condition  Airway Management Planned: Oral ETT  Additional Equipment:   Intra-op Plan:   Post-operative Plan: Extubation in OR  Informed Consent: I have reviewed the patients History and Physical, chart, labs and discussed the procedure including the risks, benefits and alternatives for the proposed anesthesia with the patient or authorized representative who has indicated his/her understanding and acceptance.     Plan Discussed with: CRNA, Anesthesiologist and Surgeon  Anesthesia Plan Comments:         Anesthesia Quick Evaluation

## 2017-10-08 NOTE — Interval H&P Note (Signed)
History and Physical Interval Note:  10/08/2017 2:08 PM  Mary Dominguez  has presented today for surgery, with the diagnosis of left adnexal mass  The various methods of treatment have been discussed with the patient and family. After consideration of risks, benefits and other options for treatment, the patient has consented to  Procedure(s): XI ROBOTIC ASSISTED UNILATERAL SALPINGO OOPHORECTOMY, POSSIBLE LAPAROTOMY, POSSIBLE STAGING (N/A) as a surgical intervention .  The patient's history has been reviewed, patient examined, no change in status, stable for surgery.  I have reviewed the patient's chart and labs.  Questions were answered to the patient's satisfaction.     [name]

## 2017-10-08 NOTE — Transfer of Care (Signed)
Immediate Anesthesia Transfer of Care Note  Patient: Mary Dominguez  Procedure(s) Performed: XI ROBOTIC ASSISTED UNILATERAL SALPINGO OOPHORECTOMY (Left )  Patient Location: PACU  Anesthesia Type:General  Level of Consciousness: awake, alert  and oriented  Airway & Oxygen Therapy: Patient Spontanous Breathing and Patient connected to face mask oxygen  Post-op Assessment: Report given to RN and Post -op Vital signs reviewed and stable  Post vital signs: Reviewed and stable  Last Vitals:  Vitals Value Taken Time  BP    Temp    Pulse    Resp 16 10/08/2017  4:08 PM  SpO2    Vitals shown include unvalidated device data.  Last Pain:  Vitals:   10/08/17 1230  TempSrc:   PainSc: 0-No pain      Patients Stated Pain Goal: 4 (30/07/62 2633)  Complications: No apparent anesthesia complications

## 2017-10-08 NOTE — Op Note (Signed)
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   Anesthesia: GETA  Specimens: left tube/ovary, pelvic washings  Complications: None  Indication for Procedure:  Patient noted to have a complex left adnexal mass  Operative Findings: Left ovarian cystic lesion ~4cm mildly adherent to left pelvic sidewall.  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  Procedure in Detail:  The patient was taken to the operating room and placed under general endotracheal anesthesia without difficulty. Her arms were tucked at her side. She was placed in a dorsolithotomy position and cervical acromial pads were placed. The patient had TED hose for VTE prophylaxis. SCD's were unavailable.  The patient was then prepped in the usual sterile fashion.  Time out was performed.   Foley was placed by me.  A spongestick was placed in the vagina.  A 71mm incision was made in the left upper quadrant palmer's point and a 5 mm Optiview trocar used to enter the abdomen under direct visualization. With entry into the abdomen and then maintenance of 15 mm of mercury the patient was placed in Trendelenburg position. An incision was made inferior to the umbilicus and used for an 7mm trocar. To the right of this one additional 95mm trocars was placed approximately lateral to the umbilical trocar site. An 30mm trocar was also placed in the left lateral abdominal wall. The 69mm LUQ trocar was changed out to 58mm airseal. The robot was docked.  The abdomen was inspected as was the  pelvis.  Findings as noted. Pelvic washings were obtained. The mass/cyst was noted on the left pelvic sidewall. An incision was made on the pelvic side wall peritoneum parallel to the IP ligament and the left retroperitoneal space entered. The left ureter was identified and the para-rectal space was developed. A window was created in the broad ligament above the ureter. The infundibulopelvic vessels were skeletonized cauterized and transected. The remnant utero-ovarian and presumed portion of broad ligament was similarly cauterized and transected. Specimen was placed in an Endo Catch bag.  The contralateral adnexa inspected and noted to have no ovary present.  The operative sites were inspected and hemostasis was noted.   The robot was undocked. The bag holding the left tube/ovary was brought through the LUQ trocar site and the bag opened. The cyst was visualized and aspirated noting clear fluid. The mass was then morcellated to facilitate removal from the abdominal cavity.  The ports were all removed. The fascial closure at the left upper quadrant port was made with 0 Vicryl.  All incisions were closed with interrupted 4-0 vicryl and running subcuticular 4-0 Monocryl suture. Dermabond was applied.  Sponge, lap and needle counts were correct x 2          Disposition: PACU -         Condition: stable

## 2017-10-08 NOTE — Anesthesia Procedure Notes (Signed)
Procedure Name: Intubation Date/Time: 10/08/2017 2:50 PM Performed by: Jonna Munro, CRNA Pre-anesthesia Checklist: Patient identified, Emergency Drugs available, Suction available, Patient being monitored and Timeout performed Patient Re-evaluated:Patient Re-evaluated prior to induction Oxygen Delivery Method: Circle system utilized Preoxygenation: Pre-oxygenation with 100% oxygen Induction Type: IV induction Ventilation: Mask ventilation without difficulty Laryngoscope Size: Mac and 3 Grade View: Grade I Tube type: Oral Tube size: 7.0 mm Number of attempts: 1 Airway Equipment and Method: Stylet Placement Confirmation: ETT inserted through vocal cords under direct vision,  positive ETCO2 and breath sounds checked- equal and bilateral Secured at: 22 cm Tube secured with: Tape Dental Injury: Teeth and Oropharynx as per pre-operative assessment

## 2017-10-09 ENCOUNTER — Encounter (HOSPITAL_COMMUNITY): Payer: Self-pay | Admitting: Obstetrics

## 2017-10-10 ENCOUNTER — Telehealth: Payer: Self-pay | Admitting: Gynecologic Oncology

## 2017-10-10 NOTE — Telephone Encounter (Signed)
Attempted to call patient and check on post-op status.  Left message with benign path results.  Advised to call for any needs.

## 2017-10-15 ENCOUNTER — Inpatient Hospital Stay: Payer: BLUE CROSS/BLUE SHIELD | Attending: Obstetrics | Admitting: Obstetrics

## 2017-10-15 ENCOUNTER — Encounter: Payer: Self-pay | Admitting: Gynecologic Oncology

## 2017-10-15 ENCOUNTER — Encounter: Payer: Self-pay | Admitting: Obstetrics

## 2017-10-15 VITALS — BP 106/65 | HR 86 | Temp 98.6°F | Resp 18 | Ht 67.0 in | Wt 153.2 lb

## 2017-10-15 DIAGNOSIS — N838 Other noninflammatory disorders of ovary, fallopian tube and broad ligament: Secondary | ICD-10-CM

## 2017-10-15 DIAGNOSIS — D271 Benign neoplasm of left ovary: Secondary | ICD-10-CM

## 2017-10-15 DIAGNOSIS — Z90721 Acquired absence of ovaries, unilateral: Secondary | ICD-10-CM | POA: Insufficient documentation

## 2017-10-15 NOTE — Progress Notes (Signed)
S: Patient returns for an postop visit and pathology review. She is doing well. Denies pain, nausea, emesis, fever. Bowel and bladder function normal. She underwent a RA-LSC LSO 10/08/17 LEFT OVARY: - MUCINOUS CYSTADENOMA. - NO BORDERLINE CHANGE OR MALIGNANCY. - LEFT FALLOPIAN TUBE: - BENIGN PARATUBAL CYST. - NO ENDOMETRIOSIS OR MALIGNANCY.  O:  Vitals:   10/15/17 1338  BP: 106/65  Pulse: 86  Resp: 18  Temp: 98.6 F (37 C)  SpO2: 100%   General :  Well developed, 52 y.o., female in no apparent distress HEENT:  Normocephalic/atraumatic, symmetric, EOMI, eyelids normal Neck:   No visible masses.  Respiratory:  Respirations unlabored, no use of accessory muscles CV:   Deferred Breast:  Deferred Musculoskeletal: Normal muscle strength. Abdomen:  Wounds CDI, with some bruising. No visible masses or protrusion Extremities:  No visible edema or deformities Skin:   Normal inspection Neuro/Psych:  No focal motor deficit, no abnormal mental status. Normal gait. Normal affect. Alert and oriented to person, place, and time     A/P Return to work 10/27/2017 with activity restrictions no heavy lifting, pushing, pulling, and no travel May return 11/19/2017 to full activity Disposition back to PCP, return as needed with surgery related issues.  Cc: Susy Frizzle, MD (PCP)

## 2017-10-15 NOTE — Patient Instructions (Signed)
1. Return to work 10/27/2017 with activity restrictions no heavy lifting, pushing, pulling, and no travel 2. May return 11/19/2017 to full activity 3. Return if any concerns

## 2017-11-13 ENCOUNTER — Other Ambulatory Visit: Payer: Self-pay | Admitting: Family Medicine

## 2017-11-13 NOTE — Telephone Encounter (Signed)
Pt is requesting refill on Xanax   LOV: 08/11/17  LRF:   08/11/17

## 2017-12-14 ENCOUNTER — Other Ambulatory Visit: Payer: Self-pay | Admitting: Family Medicine

## 2017-12-15 NOTE — Telephone Encounter (Signed)
Can you resend Xanax d/t the back order situation?

## 2018-01-01 ENCOUNTER — Ambulatory Visit: Payer: BLUE CROSS/BLUE SHIELD | Admitting: Family Medicine

## 2018-01-06 ENCOUNTER — Encounter: Payer: Self-pay | Admitting: Family Medicine

## 2018-01-14 ENCOUNTER — Other Ambulatory Visit: Payer: Self-pay | Admitting: Family Medicine

## 2018-01-14 NOTE — Telephone Encounter (Signed)
Ok to refill??  Last office visit 09/17/2017.  Last refill 12/15/2017.

## 2018-02-16 ENCOUNTER — Other Ambulatory Visit: Payer: Self-pay | Admitting: Family Medicine

## 2018-02-17 NOTE — Telephone Encounter (Signed)
Ok to refill??  Last office visit 09/17/2017.  Last refill 01/15/2018.

## 2018-03-20 ENCOUNTER — Other Ambulatory Visit: Payer: Self-pay | Admitting: *Deleted

## 2018-03-20 MED ORDER — ALPRAZOLAM 0.5 MG PO TABS: 0.5000 mg | ORAL_TABLET | Freq: Every day | ORAL | 0 refills | Status: DC | PRN

## 2018-03-20 NOTE — Telephone Encounter (Signed)
Received fax requesting refill on Xanax.   Ok to refill??  Last office visit 09/17/2017.  Last refill 12/15/2017.

## 2018-04-20 ENCOUNTER — Other Ambulatory Visit: Payer: Self-pay | Admitting: Family Medicine

## 2018-04-20 NOTE — Telephone Encounter (Signed)
Ok to refill??  Last office visit 09/17/2017.  Last refill 03/20/2018.

## 2018-05-24 ENCOUNTER — Other Ambulatory Visit: Payer: Self-pay | Admitting: Family Medicine

## 2018-05-25 NOTE — Telephone Encounter (Signed)
Pt is requesting refill on Xanax   LOV: 08/11/17  LRF:   04/20/18

## 2018-05-29 ENCOUNTER — Other Ambulatory Visit: Payer: Self-pay | Admitting: Family Medicine

## 2018-06-19 ENCOUNTER — Other Ambulatory Visit: Payer: Self-pay

## 2018-06-19 ENCOUNTER — Ambulatory Visit (INDEPENDENT_AMBULATORY_CARE_PROVIDER_SITE_OTHER): Payer: 59 | Admitting: Family Medicine

## 2018-06-19 ENCOUNTER — Encounter: Payer: Self-pay | Admitting: Family Medicine

## 2018-06-19 VITALS — BP 120/80 | HR 84 | Temp 97.8°F | Resp 15 | Ht 67.0 in | Wt 155.0 lb

## 2018-06-19 DIAGNOSIS — R198 Other specified symptoms and signs involving the digestive system and abdomen: Secondary | ICD-10-CM

## 2018-06-19 DIAGNOSIS — Z13220 Encounter for screening for lipoid disorders: Secondary | ICD-10-CM | POA: Diagnosis not present

## 2018-06-19 DIAGNOSIS — G43109 Migraine with aura, not intractable, without status migrainosus: Secondary | ICD-10-CM

## 2018-06-19 DIAGNOSIS — Z0001 Encounter for general adult medical examination with abnormal findings: Secondary | ICD-10-CM

## 2018-06-19 DIAGNOSIS — Z1239 Encounter for other screening for malignant neoplasm of breast: Secondary | ICD-10-CM

## 2018-06-19 DIAGNOSIS — R0989 Other specified symptoms and signs involving the circulatory and respiratory systems: Secondary | ICD-10-CM

## 2018-06-19 DIAGNOSIS — F5101 Primary insomnia: Secondary | ICD-10-CM

## 2018-06-19 DIAGNOSIS — F17200 Nicotine dependence, unspecified, uncomplicated: Secondary | ICD-10-CM

## 2018-06-19 DIAGNOSIS — K439 Ventral hernia without obstruction or gangrene: Secondary | ICD-10-CM

## 2018-06-19 DIAGNOSIS — Z0000 Encounter for general adult medical examination without abnormal findings: Secondary | ICD-10-CM

## 2018-06-19 MED ORDER — CYCLOBENZAPRINE HCL 10 MG PO TABS: 10.0000 mg | ORAL_TABLET | Freq: Three times a day (TID) | ORAL | 0 refills | Status: DC | PRN

## 2018-06-19 NOTE — Progress Notes (Signed)
Subjective:    Patient ID: Mary Dominguez, female    DOB: 1965-11-02, 53 y.o.   MRN: [id number]  HPI Patient is a very pleasant 53 year old Caucasian female here today for complete physical exam.  However she has several concerns.  Last year, the patient had a CT scan to evaluate an abdominal hernia.  She was found to have a small umbilical hernia however there was also a coincidental finding of a 4.5 cm left adnexal mass with septations.  She underwent surgical excision of this mass which proved to be benign.  Colonoscopy was performed last year and revealed several polyps.  They recommended a repeat colonoscopy in 3 years.  She does not require Pap smears due to her history of a hysterectomy.  Her mammogram is overdue.  Patient states that she has been getting a pressure-like sensation below the angle of the mandible bilaterally.  It occurs every day.  It feels like she is strangling.  She denies any difficulty swallowing or difficulty breathing it just feels like an intense pressure that last hours.  She is also been getting starburst flashing lights in her field of vision followed by a moderate to severe headache.  This occurs more frequently.  She denies any hemoptysis.  She denies any hematemesis.  She denies any dysphagia.  There is no goiter on exam today.  There is no palpable mass or lymphadenopathy Immunization History  Administered Date(s) Administered  . Tdap 07/10/2014    Past Medical History:  Diagnosis Date  . Adnexal mass    LEFT  . Bronchitis    09-27-17, TOOK ANTIBIOTICS AND PREDNOSONE NOW RESOLVED  . Bronchitis   . Former smoker   . GERD (gastroesophageal reflux disease)   . Hernia, umbilical   . Insomnia    Past Surgical History:  Procedure Laterality Date  . APPENDECTOMY  AGE 4  . COLONOSCOPY WITH PROPOFOL N/A 09/30/2017   Procedure: COLONOSCOPY WITH PROPOFOL;  Surgeon: Danie Binder, MD;  Location: [facility];  Service: Endoscopy;  Laterality: N/A;  2:30pm   . OOPHORECTOMY     USO 2002  . ROBOTIC ASSISTED SALPINGO OOPHERECTOMY Left 10/08/2017   Procedure: XI ROBOTIC ASSISTED UNILATERAL SALPINGO OOPHORECTOMY;  Surgeon: [name], MD;  Location: [facility];  Service: Gynecology;  Laterality: Left;  Marland Kitchen VAGINAL HYSTERECTOMY     dysplasia and AUB   Current Outpatient Medications on File Prior to Visit  Medication Sig Dispense Refill  . ALPRAZolam (XANAX) 0.5 MG tablet TAKE 1 TABLET (0.5 MG TOTAL) BY MOUTH DAILY AS NEEDED. 30 tablet 0  . estrogens, conjugated, (PREMARIN) 0.625 MG tablet TAKE 2 TABLETS BY MOUTH EVERY DAY 180 tablet 1  . omeprazole (PRILOSEC) 40 MG capsule TAKE 1 CAPSULE BY MOUTH EVERY DAY 90 capsule 1   No current facility-administered medications on file prior to visit.    Allergies  Allergen Reactions  . Penicillins Shortness Of Breath    Childhood reaction. NOTES that she is OK with cephalosporins  . Percocet [Oxycodone-Acetaminophen] Nausea And Vomiting  . Ivp Dye [Iodinated Diagnostic Agents] Itching    Itching, sneezing, will need premeds in future.  [place] [Levofloxacin In D5w] Rash   Social History   Socioeconomic History  . Marital status: Married    Spouse name: Not on file  . Number of children: Not on file  . Years of education: Not on file  . Highest education level: Not on file  Occupational History  . Not on file  Social Needs  . Financial resource strain: Not on file  . Food insecurity    Worry: Not on file    Inability: Not on file  . Transportation needs    Medical: Not on file    Non-medical: Not on file  Tobacco Use  . Smoking status: Current Every Day Smoker    Packs/day: 0.75    Years: 34.00    Pack years: 25.50    Types: Cigarettes  . Smokeless tobacco: Never Used  Substance and Sexual Activity  . Alcohol use: Yes    Comment: twice a week  . Drug use: No  . Sexual activity: Yes  Lifestyle  . Physical activity    Days per week: Not on file    Minutes per session: Not on file   . Stress: Not on file  Relationships  . Social Herbalist on phone: Not on file    Gets together: Not on file    Attends religious service: Not on file    Active member of club or organization: Not on file    Attends meetings of clubs or organizations: Not on file    Relationship status: Not on file  . Intimate partner violence    Fear of current or ex partner: Not on file    Emotionally abused: Not on file    Physically abused: Not on file    Forced sexual activity: Not on file  Other Topics Concern  . Not on file  Social History Narrative  . Not on file   Family History  Problem Relation Age of Onset  . Cancer Neg Hx        no breast, ovarian, or colon cancer  . Colon polyps Neg Hx      Review of Systems  All other systems reviewed and are negative.      Objective:   Physical Exam  Constitutional: She is oriented to person, place, and time. She appears well-developed and well-nourished. No distress.  HENT:  Head: Normocephalic and atraumatic.  Right Ear: External ear normal.  Left Ear: External ear normal.  Nose: Nose normal.  Mouth/Throat: Oropharynx is clear and moist. No oropharyngeal exudate.  Eyes: Pupils are equal, round, and reactive to light. Conjunctivae and EOM are normal. Right eye exhibits no discharge. Left eye exhibits no discharge. No scleral icterus.  Neck: Normal range of motion. Neck supple. No JVD present. No tracheal deviation present. No thyromegaly present.  Cardiovascular: Normal rate, regular rhythm, normal heart sounds and intact distal pulses. Exam reveals no gallop and no friction rub.  No murmur heard. Pulmonary/Chest: Effort normal and breath sounds normal. No stridor. No respiratory distress. She has no wheezes. She has no rales. She exhibits no tenderness.  Abdominal: Soft. Bowel sounds are normal. She exhibits no distension and no mass. There is no abdominal tenderness. There is no rebound and no guarding.  Musculoskeletal:  Normal range of motion.        General: No tenderness or edema.  Lymphadenopathy:    She has no cervical adenopathy.  Neurological: She is alert and oriented to person, place, and time. She has normal reflexes. No cranial nerve deficit. She exhibits normal muscle tone. Coordination normal.  Skin: Skin is warm. No rash noted. She is not diaphoretic. No erythema. No pallor.  Psychiatric: She has a normal mood and affect. Her behavior is normal. Judgment and thought content normal.  Vitals reviewed.         Assessment &  Plan:  1. Screening cholesterol level Given her smoking, I will check a fasting lipid panel and I would like her LDL cholesterol to be below below 100.  Recommended smoking cessation - CBC with Differential/Platelet - COMPLETE METABOLIC PANEL WITH GFR - Lipid panel  2. Breast cancer screening Schedule the patient for mammogram - MM Digital Screening; Future  3. Current smoker Encourage smoking cessation 4. Ventral hernia without obstruction or gangrene Patient does have a small ventral hernia as well as a diastases recti.  At the present time patient declines surgical treatment of the hernia  5. Primary insomnia Patient wants to stop Xanax.  She is interested in trying trazodone however we deferred this until we have determine the cause of her globus sensation  6. Globus sensation Physical exam today is normal.  I suspect muscle tightness and spasms in the sternocleidomastoid muscle brought on a lot by stress and possibly head position at work.  I recommended trying Flexeril every 8 hours as needed for the tightness over the weekend.  If the symptoms improve I would refer the patient to physical therapy for cervical neck spasms causing the pressure-like sensation.  If the symptoms do not improve I would recommend a CT scan to rule out mass that I cannot palpate on exam 7. Ocular migraine We could try Topamax for this however we did not have enough time to discuss this  with all of her issues today.  I recommended that she make another appointment so that we could discuss treatment of ocular migraines 8. General medical exam Obtain fasting lab work.  Schedule mammogram.  Colonoscopy is up-to-date.  Pap smears not necessary

## 2018-06-20 LAB — CBC WITH DIFFERENTIAL/PLATELET
Absolute Monocytes: 567 cells/uL (ref 200–950)
Basophils Absolute: 65 cells/uL (ref 0–200)
Basophils Relative: 0.6 %
Eosinophils Absolute: 164 cells/uL (ref 15–500)
Eosinophils Relative: 1.5 %
HCT: 48.8 % — ABNORMAL HIGH (ref 35.0–45.0)
Hemoglobin: 16.4 g/dL — ABNORMAL HIGH (ref 11.7–15.5)
Lymphs Abs: 1875 cells/uL (ref 850–3900)
MCH: 31.6 pg (ref 27.0–33.0)
MCHC: 33.6 g/dL (ref 32.0–36.0)
MCV: 94 fL (ref 80.0–100.0)
MPV: 11.3 fL (ref 7.5–12.5)
Monocytes Relative: 5.2 %
Neutro Abs: 8230 cells/uL — ABNORMAL HIGH (ref 1500–7800)
Neutrophils Relative %: 75.5 %
Platelets: 264 10*3/uL (ref 140–400)
RBC: 5.19 10*6/uL — ABNORMAL HIGH (ref 3.80–5.10)
RDW: 12.3 % (ref 11.0–15.0)
Total Lymphocyte: 17.2 %
WBC: 10.9 10*3/uL — ABNORMAL HIGH (ref 3.8–10.8)

## 2018-06-20 LAB — LIPID PANEL
Cholesterol: 275 mg/dL — ABNORMAL HIGH (ref <200)
HDL: 57 mg/dL (ref >=50)
LDL Cholesterol (Calc): 182 mg/dL (calc) — ABNORMAL HIGH
Non-HDL Cholesterol (Calc): 218 mg/dL (calc) — ABNORMAL HIGH (ref <130)
Total CHOL/HDL Ratio: 4.8 (calc) (ref <5.0)
Triglycerides: 203 mg/dL — ABNORMAL HIGH (ref <150)

## 2018-06-20 LAB — COMPLETE METABOLIC PANEL WITH GFR
AG Ratio: 1.8 (calc) (ref 1.0–2.5)
ALT: 13 U/L (ref 6–29)
AST: 19 U/L (ref 10–35)
Albumin: 4.5 g/dL (ref 3.6–5.1)
Alkaline phosphatase (APISO): 59 U/L (ref 37–153)
BUN: 10 mg/dL (ref 7–25)
CO2: 28 mmol/L (ref 20–32)
Calcium: 9.4 mg/dL (ref 8.6–10.4)
Chloride: 101 mmol/L (ref 98–110)
Creat: 0.72 mg/dL (ref 0.50–1.05)
GFR, Est African American: 111 mL/min/{1.73_m2} (ref >=60)
GFR, Est Non African American: 96 mL/min/{1.73_m2} (ref >=60)
Globulin: 2.5 g/dL (calc) (ref 1.9–3.7)
Glucose, Bld: 97 mg/dL (ref 65–99)
Potassium: 4.7 mmol/L (ref 3.5–5.3)
Sodium: 139 mmol/L (ref 135–146)
Total Bilirubin: 0.7 mg/dL (ref 0.2–1.2)
Total Protein: 7 g/dL (ref 6.1–8.1)

## 2018-06-28 ENCOUNTER — Other Ambulatory Visit: Payer: Self-pay | Admitting: Family Medicine

## 2018-06-29 NOTE — Telephone Encounter (Signed)
Requested Prescriptions   Pending Prescriptions Disp Refills  . ALPRAZolam (XANAX) 0.5 MG tablet [Pharmacy Med Name: ALPRAZOLAM 0.5 MG TABLET] 30 tablet 0    Sig: TAKE 1 TABLET (0.5 MG TOTAL) BY MOUTH DAILY AS NEEDED.   Last OV 06/19/2018 Last written 05/25/2018

## 2018-07-08 ENCOUNTER — Other Ambulatory Visit: Payer: Self-pay

## 2018-07-08 ENCOUNTER — Ambulatory Visit
Admission: RE | Admit: 2018-07-08 | Discharge: 2018-07-08 | Disposition: A | Discharge status: Home / Self Care | Payer: 59 | Source: Ambulatory Visit | Attending: Family Medicine | Admitting: Family Medicine

## 2018-07-08 DIAGNOSIS — Z1239 Encounter for other screening for malignant neoplasm of breast: Secondary | ICD-10-CM

## 2018-07-09 ENCOUNTER — Other Ambulatory Visit: Payer: Self-pay | Admitting: Family Medicine

## 2018-07-09 DIAGNOSIS — R928 Other abnormal and inconclusive findings on diagnostic imaging of breast: Secondary | ICD-10-CM

## 2018-07-17 ENCOUNTER — Other Ambulatory Visit: Payer: Self-pay

## 2018-07-17 ENCOUNTER — Ambulatory Visit: Payer: 59

## 2018-07-17 ENCOUNTER — Ambulatory Visit: Payer: 59 | Admitting: Family Medicine

## 2018-07-17 ENCOUNTER — Ambulatory Visit
Admission: RE | Admit: 2018-07-17 | Discharge: 2018-07-17 | Disposition: A | Discharge status: Home / Self Care | Payer: 59 | Source: Ambulatory Visit | Attending: Family Medicine | Admitting: Family Medicine

## 2018-07-17 DIAGNOSIS — R928 Other abnormal and inconclusive findings on diagnostic imaging of breast: Secondary | ICD-10-CM

## 2018-08-06 ENCOUNTER — Other Ambulatory Visit: Payer: Self-pay | Admitting: Family Medicine

## 2018-08-06 MED ORDER — ALPRAZOLAM 0.5 MG PO TABS: 0.5000 mg | ORAL_TABLET | Freq: Every day | ORAL | 2 refills | Status: DC | PRN

## 2018-08-06 NOTE — Telephone Encounter (Signed)
Refill on xanax to [facility]  Pt also needs [place] to remove flexeril off her med list and list it as an allergy she said it caused her lips to tingle.

## 2018-08-06 NOTE — Telephone Encounter (Signed)
Pt is requesting refill on Xanax   LOV: 06/19/18  LRF:   06/29/18

## 2018-11-04 ENCOUNTER — Other Ambulatory Visit: Payer: Self-pay | Admitting: Family Medicine

## 2018-11-04 NOTE — Telephone Encounter (Signed)
Ok to refill??  Last office visit 06/19/2018.  Last refill 08/06/2018.

## 2018-11-08 ENCOUNTER — Other Ambulatory Visit: Payer: Self-pay | Admitting: Family Medicine

## 2019-02-08 ENCOUNTER — Other Ambulatory Visit: Payer: Self-pay | Admitting: Family Medicine

## 2019-02-08 NOTE — Telephone Encounter (Signed)
Ok to refill??  Last office visit 06/19/2018.  Last refill 11/05/2018, #2 refills.

## 2019-03-13 ENCOUNTER — Other Ambulatory Visit: Payer: Self-pay | Admitting: Family Medicine

## 2019-04-27 ENCOUNTER — Encounter: Payer: Self-pay | Admitting: Family Medicine

## 2019-04-27 ENCOUNTER — Ambulatory Visit: Payer: 59 | Admitting: Family Medicine

## 2019-04-27 ENCOUNTER — Other Ambulatory Visit: Payer: Self-pay

## 2019-04-27 VITALS — BP 120/84 | HR 82 | Temp 97.7°F | Resp 15 | Wt 159.0 lb

## 2019-04-27 DIAGNOSIS — R1084 Generalized abdominal pain: Secondary | ICD-10-CM | POA: Diagnosis not present

## 2019-04-27 DIAGNOSIS — R1030 Lower abdominal pain, unspecified: Secondary | ICD-10-CM

## 2019-04-27 LAB — COMPLETE METABOLIC PANEL WITH GFR
AG Ratio: 1.5 (calc) (ref 1.0–2.5)
ALT: 15 U/L (ref 6–29)
AST: 17 U/L (ref 10–35)
Albumin: 4.3 g/dL (ref 3.6–5.1)
Alkaline phosphatase (APISO): 62 U/L (ref 37–153)
BUN: 9 mg/dL (ref 7–25)
CO2: 30 mmol/L (ref 20–32)
Calcium: 9.5 mg/dL (ref 8.6–10.4)
Chloride: 105 mmol/L (ref 98–110)
Creat: 0.75 mg/dL (ref 0.50–1.05)
GFR, Est African American: 105 mL/min/{1.73_m2} (ref >=60)
GFR, Est Non African American: 90 mL/min/{1.73_m2} (ref >=60)
Globulin: 2.8 g/dL (calc) (ref 1.9–3.7)
Glucose, Bld: 75 mg/dL (ref 65–99)
Potassium: 4.8 mmol/L (ref 3.5–5.3)
Sodium: 140 mmol/L (ref 135–146)
Total Bilirubin: 0.4 mg/dL (ref 0.2–1.2)
Total Protein: 7.1 g/dL (ref 6.1–8.1)

## 2019-04-27 LAB — CBC WITH DIFFERENTIAL/PLATELET
Absolute Monocytes: 539 cells/uL (ref 200–950)
Basophils Absolute: 44 cells/uL (ref 0–200)
Basophils Relative: 0.5 %
Eosinophils Absolute: 148 cells/uL (ref 15–500)
Eosinophils Relative: 1.7 %
HCT: 47.1 % — ABNORMAL HIGH (ref 35.0–45.0)
Hemoglobin: 15.9 g/dL — ABNORMAL HIGH (ref 11.7–15.5)
Lymphs Abs: 2036 cells/uL (ref 850–3900)
MCH: 32.3 pg (ref 27.0–33.0)
MCHC: 33.8 g/dL (ref 32.0–36.0)
MCV: 95.5 fL (ref 80.0–100.0)
MPV: 11.5 fL (ref 7.5–12.5)
Monocytes Relative: 6.2 %
Neutro Abs: 5933 cells/uL (ref 1500–7800)
Neutrophils Relative %: 68.2 %
Platelets: 256 10*3/uL (ref 140–400)
RBC: 4.93 10*6/uL (ref 3.80–5.10)
RDW: 11.7 % (ref 11.0–15.0)
Total Lymphocyte: 23.4 %
WBC: 8.7 10*3/uL (ref 3.8–10.8)

## 2019-04-27 LAB — LIPASE: Lipase: 34 U/L (ref 7–60)

## 2019-04-27 NOTE — Progress Notes (Signed)
Subjective:    Patient ID: Mary Dominguez, female    DOB: 1965-03-10, 54 y.o.   MRN: [id number]  HPI  Patient is a very pleasant 54 year old Caucasian female who presents with 3 months of abdominal pain.  It has been steadily worsening.  Past medical history significant for an appendectomy when she was 54 years old.  She had a hysterectomy in the past.  She has had both ovaries removed.  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  I include this information because the patient has had multiple abdominal surgeries that would be at high risk for developing adhesions.  She states that over the last 3 months she has developed daily abdominal pain.  The abdominal pain is moderate to severe in nature.  It is diffuse.  Sometimes it will be in the right upper quadrant.  Sometimes it will be around the umbilicus.  At other times it will be in the left mid abdomen.  She is extremely tender to palpation all throughout the abdomen today with voluntary guarding in all 4 abdominal quadrants.  She has normal bowel sounds however.  She is not vomiting.  She denies any melena or hematochezia.  She denies any hematemesis or bilious emesis.  There is no relationship of the pain to defecation.  She is not constipated.  She denies any reflux.  She denies any vaginal bleeding or hematuria or dysuria.  Patient is extremely tender to palpation in the right upper quadrant today and the left lower quadrant today.  She does have a small umbilical hernia however there is no evidence of incarceration.  However she does have again guarding raising the concern for peritonitis. Past Medical History:  Diagnosis Date  . Adnexal mass    LEFT  . Bronchitis    09-27-17, TOOK ANTIBIOTICS AND PREDNOSONE NOW RESOLVED  . Bronchitis   . Former smoker   . GERD (gastroesophageal reflux disease)   . Hernia, umbilical   . Insomnia    Past Surgical History:  Procedure Laterality  Date  . APPENDECTOMY  AGE 14  . COLONOSCOPY WITH PROPOFOL N/A 09/30/2017   Procedure: COLONOSCOPY WITH PROPOFOL;  Surgeon: Danie Binder, MD;  Location: [facility];  Service: Endoscopy;  Laterality: N/A;  2:30pm  . OOPHORECTOMY     USO 2002  . ROBOTIC ASSISTED SALPINGO OOPHERECTOMY Left 10/08/2017   Procedure: XI ROBOTIC ASSISTED UNILATERAL SALPINGO OOPHORECTOMY;  Surgeon: [name], MD;  Location: [facility];  Service: Gynecology;  Laterality: Left;  Marland Kitchen VAGINAL HYSTERECTOMY     dysplasia and AUB   Current Outpatient Medications on File Prior to Visit  Medication Sig Dispense Refill  . ALPRAZolam (XANAX) 0.5 MG tablet TAKE 1 TABLET BY MOUTH EVERY DAY AS NEEDED 30 tablet 2  . estrogens, conjugated, (PREMARIN) 0.625 MG tablet TAKE 2 TABLETS BY MOUTH EVERY DAY 180 tablet 1  . omeprazole (PRILOSEC) 40 MG capsule TAKE 1 CAPSULE BY MOUTH EVERY DAY 90 capsule 1   No current facility-administered medications on file prior to visit.   Allergies  Allergen Reactions  . Penicillins Shortness Of Breath    Childhood reaction. NOTES that she is OK with cephalosporins  . Flexeril [Cyclobenzaprine] Other (See Comments)    Lip tingling  . Percocet [Oxycodone-Acetaminophen] Nausea And Vomiting  . Ivp Dye [Iodinated Diagnostic Agents] Itching    Itching, sneezing, will need premeds in future.  Mack Hook [Levofloxacin In D5w] Rash  Social History   Socioeconomic History  . Marital status: Married    Spouse name: Not on file  . Number of children: Not on file  . Years of education: Not on file  . Highest education level: Not on file  Occupational History  . Not on file  Tobacco Use  . Smoking status: Current Every Day Smoker    Packs/day: 0.75    Years: 34.00    Pack years: 25.50    Types: Cigarettes  . Smokeless tobacco: Never Used  Substance and Sexual Activity  . Alcohol use: Yes    Comment: twice a week  . Drug use: No  . Sexual activity: Yes  Other Topics Concern  . Not on  file  Social History Narrative  . Not on file   Social Determinants of Health   Financial Resource Strain:   . Difficulty of Paying Living Expenses:   Food Insecurity:   . Worried About Charity fundraiser in the Last Year:   . Arboriculturist in the Last Year:   Transportation Needs:   . Film/video editor (Medical):   Marland Kitchen Lack of Transportation (Non-Medical):   Physical Activity:   . Days of Exercise per Week:   . Minutes of Exercise per Session:   Stress:   . Feeling of Stress :   Social Connections:   . Frequency of Communication with Friends and Family:   . Frequency of Social Gatherings with Friends and Family:   . Attends Religious Services:   . Active Member of Clubs or Organizations:   . Attends Archivist Meetings:   Marland Kitchen Marital Status:   Intimate Partner Violence:   . Fear of Current or Ex-Partner:   . Emotionally Abused:   Marland Kitchen Physically Abused:   . Sexually Abused:        Review of Systems  All other systems reviewed and are negative.      Objective:   Physical Exam  Constitutional: She appears well-developed and well-nourished.  Neck: No JVD present.  Cardiovascular: Normal rate, regular rhythm and normal heart sounds. Exam reveals no gallop and no friction rub.  No murmur heard. Pulmonary/Chest: Effort normal. No respiratory distress. She has no wheezes. She has no rhonchi. She has no rales.  Abdominal: Soft. Bowel sounds are normal. She exhibits no distension. There is abdominal tenderness. There is guarding. There is no rebound. A hernia is present. Hernia confirmed positive in the ventral area.    Musculoskeletal:        General: No edema. Normal range of motion.     Cervical back: Neck supple.  Lymphadenopathy:    She has no cervical adenopathy.  Vitals reviewed. Diagram shows diastasis recti in yellow with hernia diagrammed with red        Assessment & Plan:  Generalized abdominal pain - Plan: CBC with Differential/Platelet,  COMPLETE METABOLIC PANEL WITH GFR, Lipase  Lower abdominal pain - Plan: CT Abdomen Pelvis W Contrast  I believe the patient's abdominal pain is most likely due to adhesions.  However I will obtain a CT scan of the abdomen pelvis given the diffuse nature of her pain in her guarding to rule out diverticulitis.  If there is no evidence of an acute infectious process or malignancy, she will need a general surgery consultation to discuss laparoscopic lysis of adhesions.  Patient recently also had a colonoscopy that showed 3 polyps in 2019.  Therefore I do not believe that she requires a repeat  colonoscopy prior to her surgery consultation.

## 2019-04-28 ENCOUNTER — Telehealth: Payer: Self-pay

## 2019-04-28 NOTE — Telephone Encounter (Signed)
After phoning in her Rx for a 13-hour prep [facility] [place], I spoke with patient to let her know to take Prednisone 50 mg PO 05/12/19 @ 0120, 0720, 1320.  She also was told to take Benadryl 50 mg PO 05/12/19 @ 1320.

## 2019-05-06 ENCOUNTER — Other Ambulatory Visit: Payer: Self-pay | Admitting: Family Medicine

## 2019-05-12 ENCOUNTER — Ambulatory Visit
Admission: RE | Admit: 2019-05-12 | Discharge: 2019-05-12 | Disposition: A | Discharge status: Home / Self Care | Payer: 59 | Source: Ambulatory Visit | Attending: Family Medicine | Admitting: Family Medicine

## 2019-05-12 DIAGNOSIS — R1030 Lower abdominal pain, unspecified: Secondary | ICD-10-CM

## 2019-05-12 MED ORDER — IOPAMIDOL (ISOVUE-300) INJECTION 61%
100.0000 mL | Freq: Once | INTRAVENOUS | Status: AC | PRN
  Administered 2019-05-12: 100 mL via INTRAVENOUS

## 2019-05-16 ENCOUNTER — Other Ambulatory Visit: Payer: Self-pay | Admitting: Family Medicine

## 2019-05-17 NOTE — Telephone Encounter (Signed)
Ok to refill??  Last office visit 04/27/2019.  Last refill 02/09/2019.

## 2019-07-28 ENCOUNTER — Other Ambulatory Visit: Payer: Self-pay | Admitting: *Deleted

## 2019-08-16 ENCOUNTER — Telehealth: Payer: Self-pay | Admitting: *Deleted

## 2019-08-16 NOTE — Telephone Encounter (Signed)
Received request from pharmacy for PA on Omeprazole.  PA submitted.   Dx: K29.1- GERD.

## 2019-08-19 ENCOUNTER — Other Ambulatory Visit: Payer: Self-pay | Admitting: Family Medicine

## 2019-08-19 DIAGNOSIS — Z1231 Encounter for screening mammogram for malignant neoplasm of breast: Secondary | ICD-10-CM

## 2019-08-19 NOTE — Telephone Encounter (Signed)
Your PA has been faxed to the plan as a paper copy. Please contact the plan directly if you haven't received a determination in a typical timeframe.  You will be notified of the determination via fax. 

## 2019-08-25 ENCOUNTER — Other Ambulatory Visit: Payer: Self-pay

## 2019-08-28 ENCOUNTER — Other Ambulatory Visit: Payer: Self-pay | Admitting: Family Medicine

## 2019-09-06 MED ORDER — PANTOPRAZOLE SODIUM 40 MG PO TBEC: 40.0000 mg | DELAYED_RELEASE_TABLET | Freq: Every day | ORAL | 3 refills | Status: DC

## 2019-09-06 NOTE — Telephone Encounter (Signed)
Switch to pantoprazole 40 mg poqday

## 2019-09-06 NOTE — Telephone Encounter (Signed)
Received PA determination.   PA denied. Product or Service is not covered for patient age.  Alternatives include famotidine and pantoprazole.   MD please advise.

## 2019-09-06 NOTE — Telephone Encounter (Signed)
Prescription sent to pharmacy.   Patient aware per MyChart.  

## 2019-09-10 ENCOUNTER — Ambulatory Visit
Admission: RE | Admit: 2019-09-10 | Discharge: 2019-09-10 | Disposition: A | Discharge status: Home / Self Care | Payer: 59 | Source: Ambulatory Visit | Attending: Family Medicine | Admitting: Family Medicine

## 2019-09-10 ENCOUNTER — Ambulatory Visit (INDEPENDENT_AMBULATORY_CARE_PROVIDER_SITE_OTHER): Payer: BC Managed Care – PPO | Admitting: Family Medicine

## 2019-09-10 ENCOUNTER — Other Ambulatory Visit: Payer: Self-pay

## 2019-09-10 VITALS — BP 120/70 | HR 91 | Temp 96.6°F | Ht 67.0 in | Wt 157.0 lb

## 2019-09-10 DIAGNOSIS — Z87891 Personal history of nicotine dependence: Secondary | ICD-10-CM | POA: Diagnosis not present

## 2019-09-10 DIAGNOSIS — E7800 Pure hypercholesterolemia, unspecified: Secondary | ICD-10-CM | POA: Diagnosis not present

## 2019-09-10 DIAGNOSIS — F419 Anxiety disorder, unspecified: Secondary | ICD-10-CM | POA: Diagnosis not present

## 2019-09-10 DIAGNOSIS — R1084 Generalized abdominal pain: Secondary | ICD-10-CM | POA: Diagnosis not present

## 2019-09-10 DIAGNOSIS — Z1231 Encounter for screening mammogram for malignant neoplasm of breast: Secondary | ICD-10-CM

## 2019-09-10 MED ORDER — FLUOCINOLONE ACETONIDE BODY 0.01 % EX OIL: 1.0000 "application " | TOPICAL_OIL | CUTANEOUS | 1 refills | Status: DC

## 2019-09-10 NOTE — Progress Notes (Signed)
Subjective:    Patient ID: Mary Dominguez, female    DOB: 11/20/1965, 54 y.o.   MRN: [id number]  HPI  04/27/19 Patient is a very pleasant 54 year old Caucasian female who presents with 3 months of abdominal pain.  It has been steadily worsening.  Past medical history significant for an appendectomy when she was 54 years old.  She had a hysterectomy in the past.  She has had both ovaries removed.  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  I include this information because the patient has had multiple abdominal surgeries that would be at high risk for developing adhesions.  She states that over the last 3 months she has developed daily abdominal pain.  The abdominal pain is moderate to severe in nature.  It is diffuse.  Sometimes it will be in the right upper quadrant.  Sometimes it will be around the umbilicus.  At other times it will be in the left mid abdomen.  She is extremely tender to palpation all throughout the abdomen today with voluntary guarding in all 4 abdominal quadrants.  She has normal bowel sounds however.  She is not vomiting.  She denies any melena or hematochezia.  She denies any hematemesis or bilious emesis.  There is no relationship of the pain to defecation.  She is not constipated.  She denies any reflux.  She denies any vaginal bleeding or hematuria or dysuria.  Patient is extremely tender to palpation in the right upper quadrant today and the left lower quadrant today.  She does have a small umbilical hernia however there is no evidence of incarceration.  However she does have again guarding raising the concern for peritonitis.  At that time, my plan was: I believe the patient's abdominal pain is most likely due to adhesions.  However I will obtain a CT scan of the abdomen pelvis given the diffuse nature of her pain in her guarding to rule out diverticulitis.  If there is no evidence of an acute infectious process or  malignancy, she will need a general surgery consultation to discuss laparoscopic lysis of adhesions.  Patient recently also had a colonoscopy that showed 3 polyps in 2019.  Therefore I do not believe that she requires a repeat colonoscopy prior to her surgery consultation.  09/10/19 CT scan showed no explanation for her abdominal pain.  Recommended surgery consultation for lysis of adhesions.  Patient states the pain subsided a few days after she last saw me and she has not experienced any pain since.  Therefore she has not kept her appointment with the surgeon.  She has deferred rescheduling at this point.  She is here today primarily to get a refill on her medications.  We obtained a CBC and a CMP in April and this was normal.  She is overdue to recheck her cholesterol.  Patient uses Xanax sparingly as needed for anxiety.  Overall she is doing well.  She has no desire to change her dose of Xanax at this point.  Patient works from home.  She is isolated.  She has yet to receive her Covid vaccination.    Lower Chest: No acute findings.  Hepatobiliary: No hepatic masses identified. Gallbladder is unremarkable. No evidence of biliary ductal dilatation.  Pancreas:  No mass or inflammatory changes.  Spleen: Within normal limits in size and appearance.  Adrenals/Urinary Tract: No masses identified. No evidence of ureteral calculi or hydronephrosis.  Stomach/Bowel: No evidence  of obstruction, inflammatory process or abnormal fluid collections. Mild sigmoid diverticulosis noted, however there is no evidence of diverticulitis.  Vascular/Lymphatic: No pathologically enlarged lymph nodes. No abdominal aortic aneurysm. Aortic atherosclerosis incidentally noted.  Reproductive: Prior hysterectomy noted. Adnexal regions are unremarkable in appearance.  Other:  No evidence of abdominal wall hernia or mass.  Musculoskeletal:  No suspicious bone lesions identified. Past Medical History:    Diagnosis Date  . Adnexal mass    LEFT  . Bronchitis    09-27-17, TOOK ANTIBIOTICS AND PREDNOSONE NOW RESOLVED  . Bronchitis   . Former smoker   . GERD (gastroesophageal reflux disease)   . Hernia, umbilical   . Insomnia    Past Surgical History:  Procedure Laterality Date  . APPENDECTOMY  AGE 54  . COLONOSCOPY WITH PROPOFOL N/A 09/30/2017   Procedure: COLONOSCOPY WITH PROPOFOL;  Surgeon: Danie Binder, MD;  Location: [facility];  Service: Endoscopy;  Laterality: N/A;  2:30pm  . OOPHORECTOMY     USO 2002  . ROBOTIC ASSISTED SALPINGO OOPHERECTOMY Left 10/08/2017   Procedure: XI ROBOTIC ASSISTED UNILATERAL SALPINGO OOPHORECTOMY;  Surgeon: [name], MD;  Location: [facility];  Service: Gynecology;  Laterality: Left;  Marland Kitchen VAGINAL HYSTERECTOMY     dysplasia and AUB   Current Outpatient Medications on File Prior to Visit  Medication Sig Dispense Refill  . ALPRAZolam (XANAX) 0.5 MG tablet TAKE 1 TABLET BY MOUTH EVERY DAY AS NEEDED 30 tablet 2  . estrogens, conjugated, (PREMARIN) 0.625 MG tablet TAKE 2 TABLETS BY MOUTH EVERY DAY 180 tablet 1  . pantoprazole (PROTONIX) 40 MG tablet Take 1 tablet (40 mg total) by mouth daily. 30 tablet 3   No current facility-administered medications on file prior to visit.   Allergies  Allergen Reactions  . Penicillins Shortness Of Breath    Childhood reaction. NOTES that she is OK with cephalosporins  . Percocet [Oxycodone-Acetaminophen] Nausea And Vomiting  . Flexeril [Cyclobenzaprine] Other (See Comments)    Lip tingling  . Ivp Dye [Iodinated Diagnostic Agents] Itching and Other (See Comments)    Itching, sneezing, will need premeds in future.  Mack Hook [Levofloxacin In D5w] Rash   Social History   Socioeconomic History  . Marital status: Married    Spouse name: Not on file  . Number of children: Not on file  . Years of education: Not on file  . Highest education level: Not on file  Occupational History  . Not on file  Tobacco Use   . Smoking status: Current Every Day Smoker    Packs/day: 0.75    Years: 34.00    Pack years: 25.50    Types: Cigarettes  . Smokeless tobacco: Never Used  Vaping Use  . Vaping Use: Never used  Substance and Sexual Activity  . Alcohol use: Yes    Comment: twice a week  . Drug use: No  . Sexual activity: Yes  Other Topics Concern  . Not on file  Social History Narrative  . Not on file   Social Determinants of Health   Financial Resource Strain:   . Difficulty of Paying Living Expenses: Not on file  Food Insecurity:   . Worried About Charity fundraiser in the Last Year: Not on file  . Ran Out of Food in the Last Year: Not on file  Transportation Needs:   . Lack of Transportation (Medical): Not on file  . Lack of Transportation (Non-Medical): Not on file  Physical Activity:   .  Days of Exercise per Week: Not on file  . Minutes of Exercise per Session: Not on file  Stress:   . Feeling of Stress : Not on file  Social Connections:   . Frequency of Communication with Friends and Family: Not on file  . Frequency of Social Gatherings with Friends and Family: Not on file  . Attends Religious Services: Not on file  . Active Member of Clubs or Organizations: Not on file  . Attends Archivist Meetings: Not on file  . Marital Status: Not on file  Intimate Partner Violence:   . Fear of Current or Ex-Partner: Not on file  . Emotionally Abused: Not on file  . Physically Abused: Not on file  . Sexually Abused: Not on file       Review of Systems  All other systems reviewed and are negative.      Objective:   Physical Exam Vitals reviewed.  Constitutional:      Appearance: She is well-developed.  Neck:     Vascular: No JVD.  Cardiovascular:     Rate and Rhythm: Normal rate and regular rhythm.     Heart sounds: Normal heart sounds. No murmur heard.  No friction rub. No gallop.   Pulmonary:     Effort: Pulmonary effort is normal. No respiratory distress.      Breath sounds: No wheezing, rhonchi or rales.  Abdominal:     General: Bowel sounds are normal. There is no distension.     Palpations: Abdomen is soft.  Musculoskeletal:        General: Normal range of motion.     Cervical back: Neck supple.  Lymphadenopathy:     Cervical: No cervical adenopathy.    Assessment & Plan:  Former smoker  Generalized abdominal pain  Pure hypercholesterolemia  Anxiety  I will be glad to refill the patient's Xanax as needed.  Thankfully her abdominal pain has resolved.  She does not have to follow-up with a surgeon unless the pain returns.  Her cholesterol was extremely high last year.  I recommended that she return fasting at her earliest convenience to check a fasting lipid panel.  Strongly recommended the Covid vaccination

## 2019-11-29 ENCOUNTER — Other Ambulatory Visit: Payer: Self-pay | Admitting: Family Medicine

## 2019-11-30 NOTE — Telephone Encounter (Signed)
Ok to refill??  Last office visit 09/10/2019  Last refill 08/30/2019., #2 refills.

## 2019-12-10 ENCOUNTER — Other Ambulatory Visit: Payer: Self-pay | Admitting: Family Medicine

## 2020-01-24 IMAGING — CT CT ABD-PELV W/ CM
1 of 3 series · 13 of 32 positions shown, 19 images · IV contrast (APPLIED)
Comparison: Abdominal ultrasound dated August 03, 2003.

CLINICAL DATA: Epigastric pain.  Evaluate for possible hernia.

EXAM:
CT ABDOMEN AND PELVIS WITH CONTRAST
TECHNIQUE: Multidetector CT imaging of the abdomen and pelvis was performed
using the standard protocol following bolus administration of
intravenous contrast.
CONTRAST:  100mL [id number] IOPAMIDOL ([id number]) INJECTION 61%
After contrast administration, the patient developed itching and
sneezing, consistent with mild reaction. Patient was appropriately
assessed by the radiologist on site and monitored for further
worsening reaction prior to discharge.

[Series 2: abd/pelvis w/cm · axial · 0.69mm/px · z∈[-444,-44]mm · 13 of 94 slices shown, 19 images]
[im 7/94  soft-tissue]
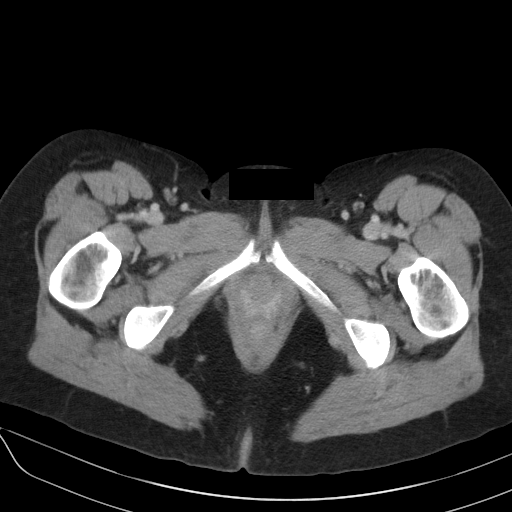
[im 7/94  bone]
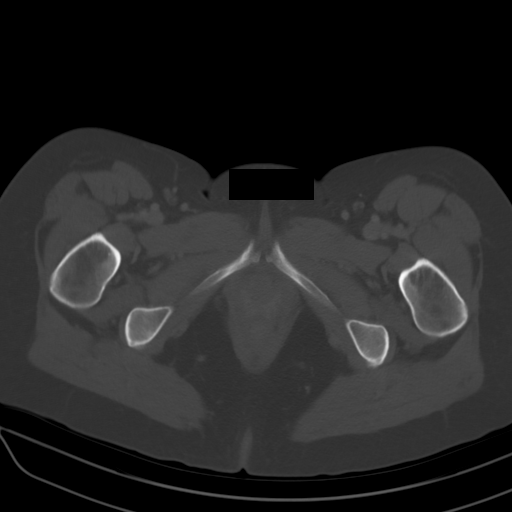
[im 14/94  soft-tissue]
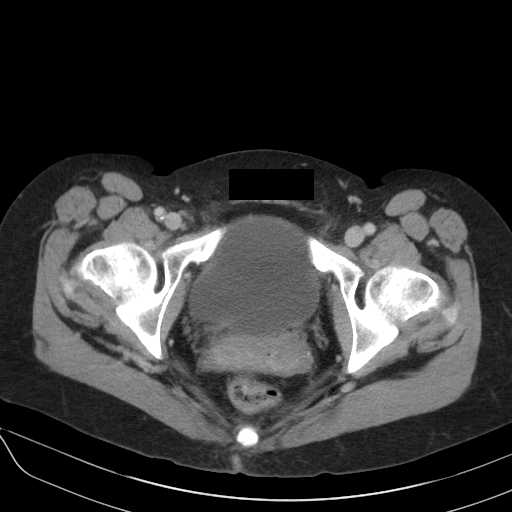
[im 20/94  soft-tissue]
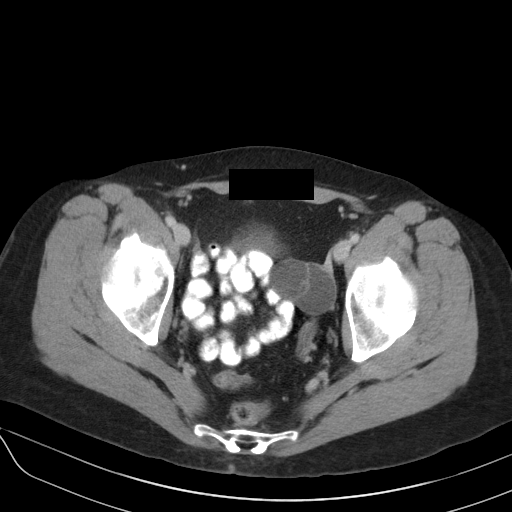
[im 27/94  soft-tissue]
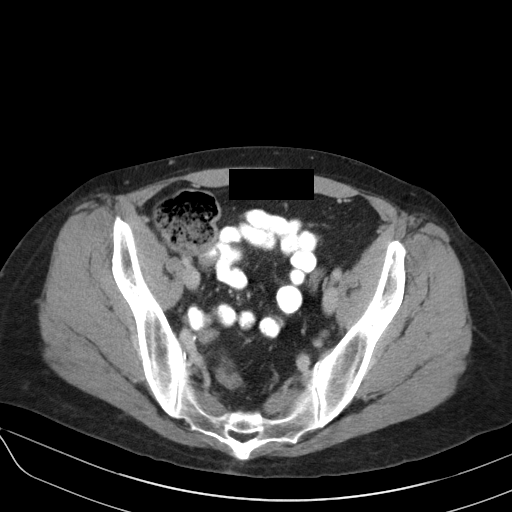
[im 34/94  soft-tissue]
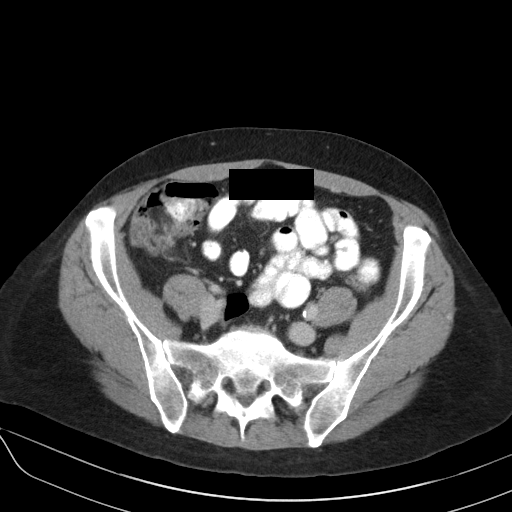
[im 40/94  soft-tissue]
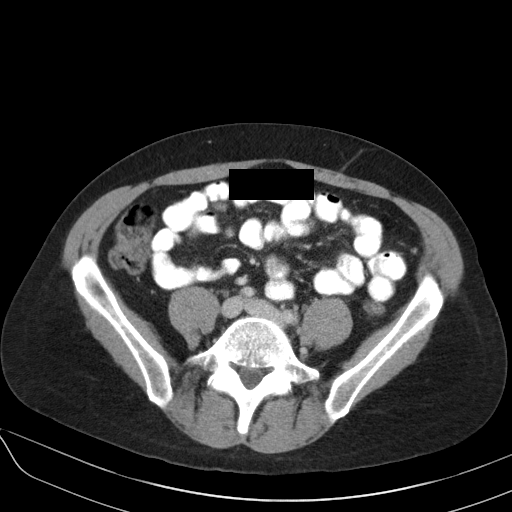
[im 47/94  soft-tissue]
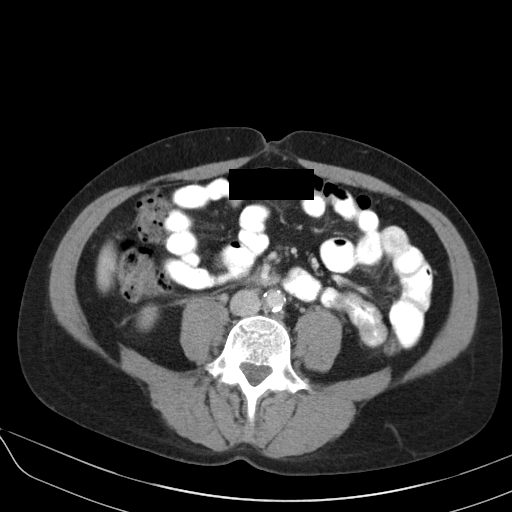
[im 54/94  soft-tissue]
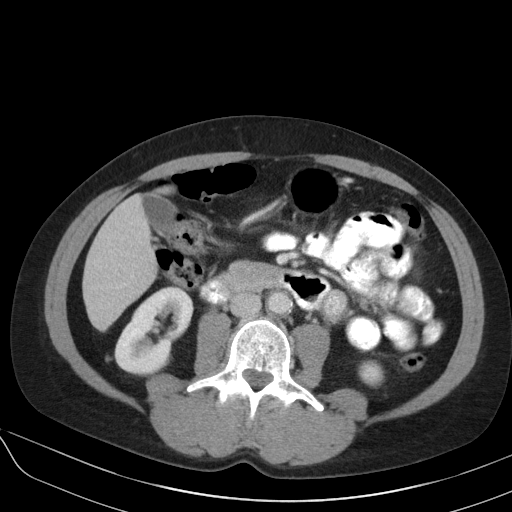
[im 60/94  soft-tissue]
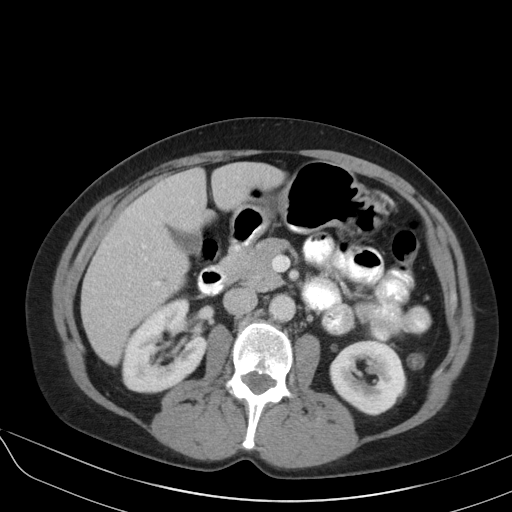
[im 60/94  bone]
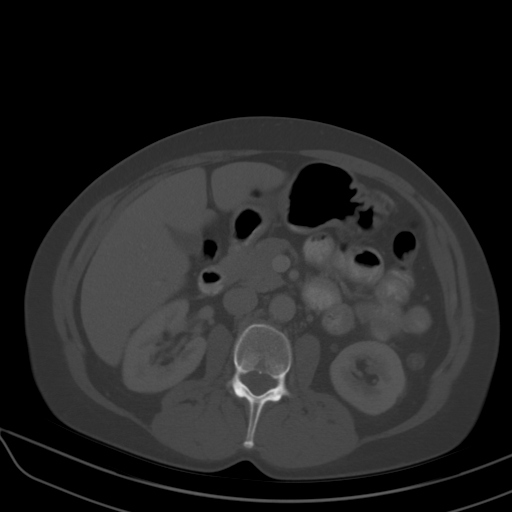
[im 67/94  soft-tissue]
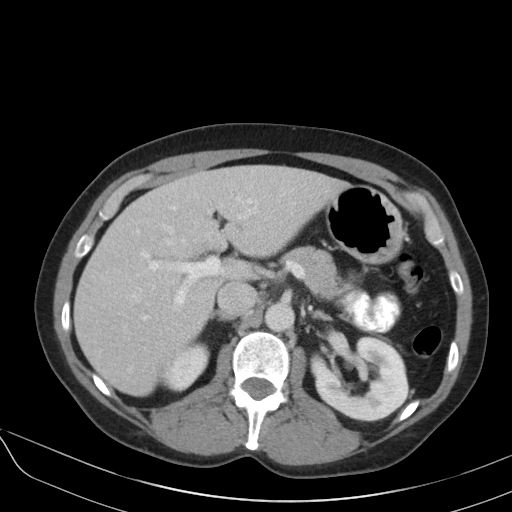
[im 67/94  lung]
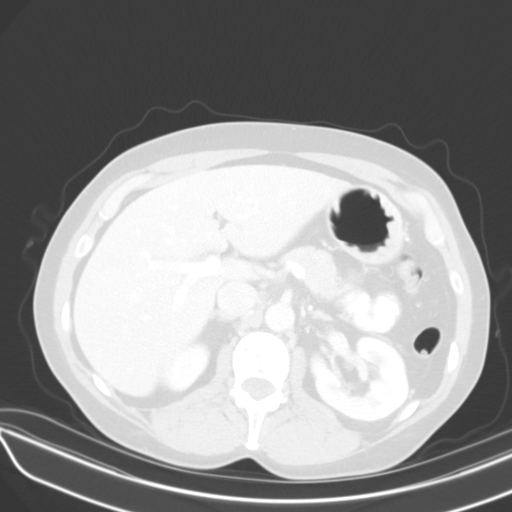
[im 74/94  soft-tissue]
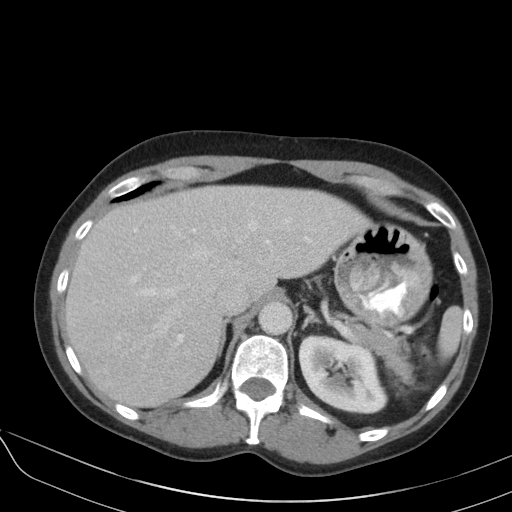
[im 74/94  lung]
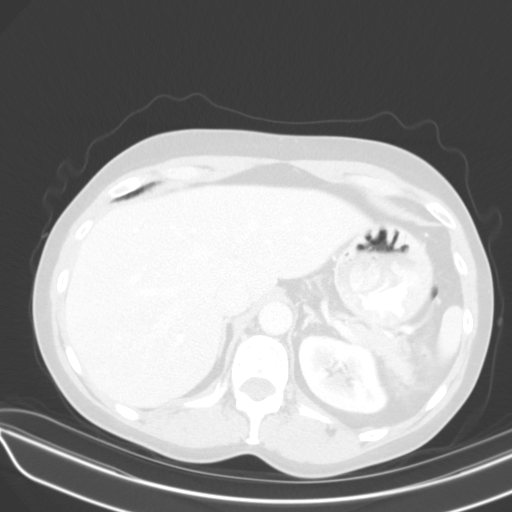
[im 80/94  soft-tissue]
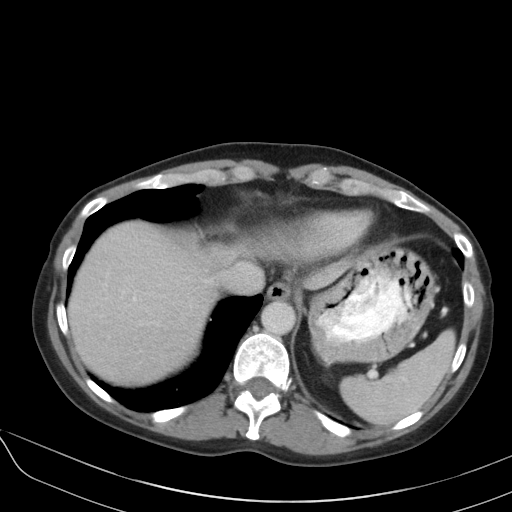
[im 80/94  lung]
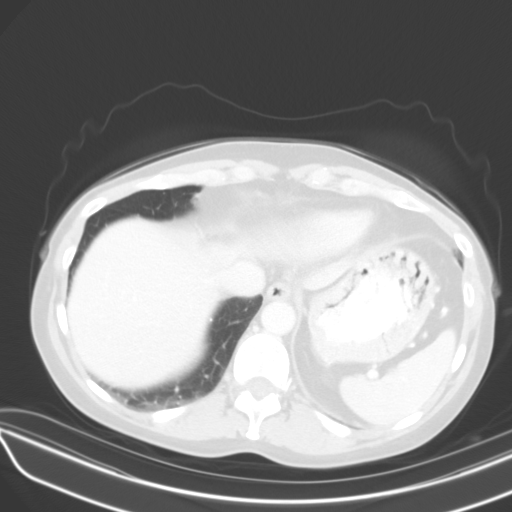
[im 87/94  soft-tissue]
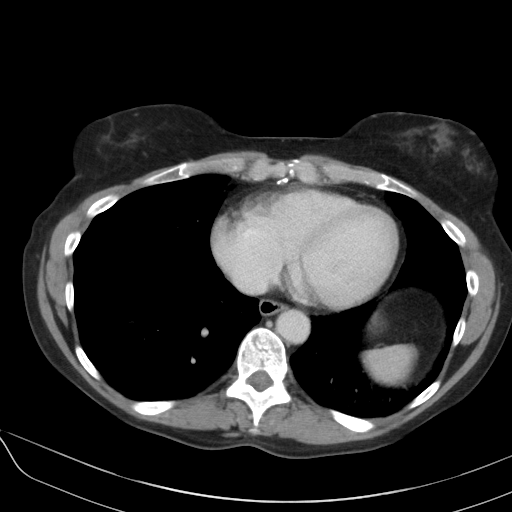
[im 87/94  lung]
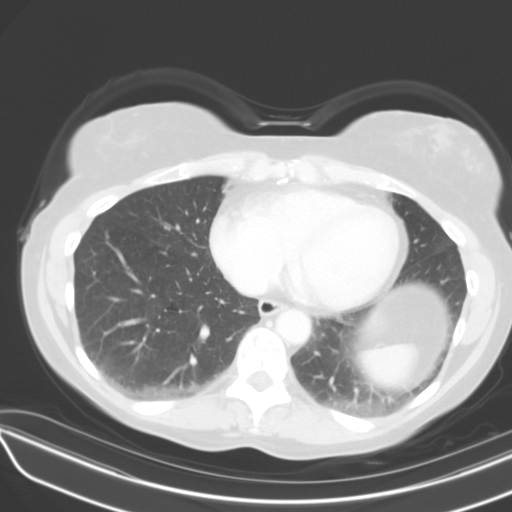

[13 of 32 positions shown; findings below may reference images not displayed]

FINDINGS: Lower chest: No acute abnormality. Dependent subsegmental
atelectasis.

Hepatobiliary: No focal liver abnormality is seen. No gallstones,
gallbladder wall thickening, or biliary dilatation.

Pancreas: Unremarkable. No pancreatic ductal dilatation or
surrounding inflammatory changes.

Spleen: Normal in size without focal abnormality.

Adrenals/Urinary Tract: Adrenal glands are unremarkable. Kidneys are
normal, without renal calculi, focal lesion, or hydronephrosis.
Bladder is unremarkable.

Stomach/Bowel: The stomach is within normal limits. No bowel wall
thickening, distention, or surrounding inflammatory changes. The
appendix is surgically absent.

Vascular/Lymphatic: Aortic atherosclerosis. No enlarged abdominal or
pelvic lymph nodes.

Reproductive: Status post hysterectomy. 4.6 cm septated low-density
lesion in the left adnexa. No right adnexal mass.

Other: Tiny fat containing umbilical hernia. No abdominopelvic
ascites. No pneumoperitoneum.

Musculoskeletal: No acute or significant osseous findings.
IMPRESSION: 1. 4.6 cm septated low-density left adnexal lesion. Recommend pelvic
ultrasound for further evaluation. This recommendation follows ACR
consensus guidelines: White Paper of the ACR Incidental Findings
Committee II on Adnexal Findings. [HOSPITAL] [DATE].
2. Tiny fat containing umbilical hernia.

## 2020-02-14 IMAGING — US US PELV - US TRANSVAGINAL
2 series · 13 of 25 positions shown · non-contrast
Comparison: Prior CT from 08/29/2017.

CLINICAL DATA: Initial evaluation for pelvic mass. History of prior
hysterectomy and unilateral oophorectomy.

EXAM:
TRANSABDOMINAL AND TRANSVAGINAL ULTRASOUND OF PELVIS
TECHNIQUE: Both transabdominal and transvaginal ultrasound examinations of the
pelvis were performed. Transabdominal technique was performed for
global imaging of the pelvis including uterus, ovaries, adnexal
regions, and pelvic cul-de-sac. It was necessary to proceed with
endovaginal exam following the transabdominal exam to visualize the
pelvic structures.

[Series 1: us pelv - us transvaginal · 12 of 58 slices shown (1 of 2)]
[im 1/58]
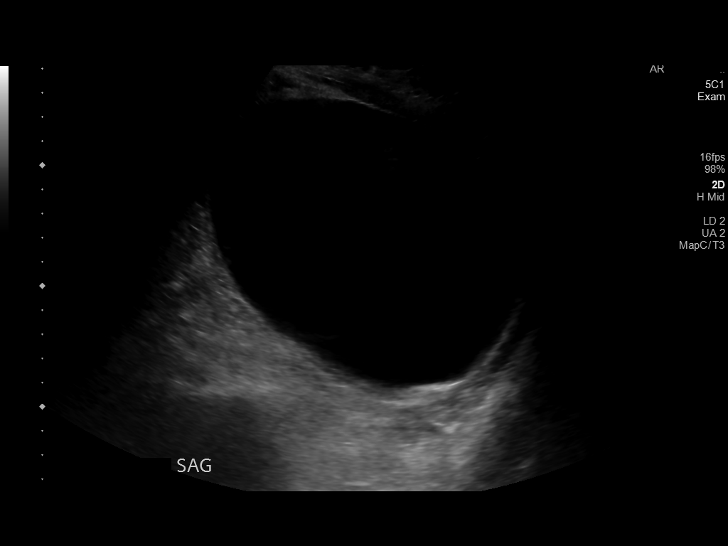
[im 5/58]
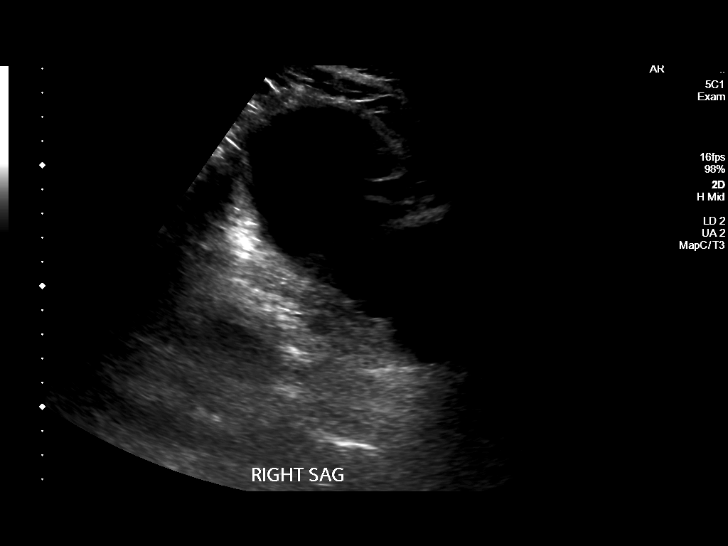
[im 10/58]
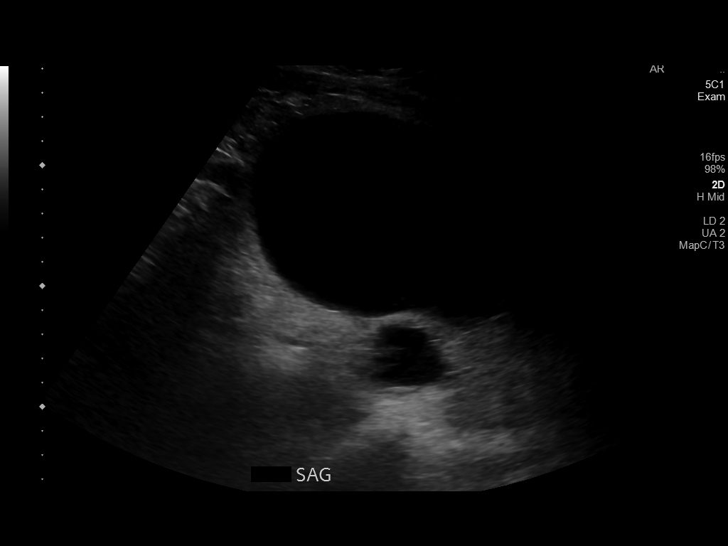
[im 15/58]
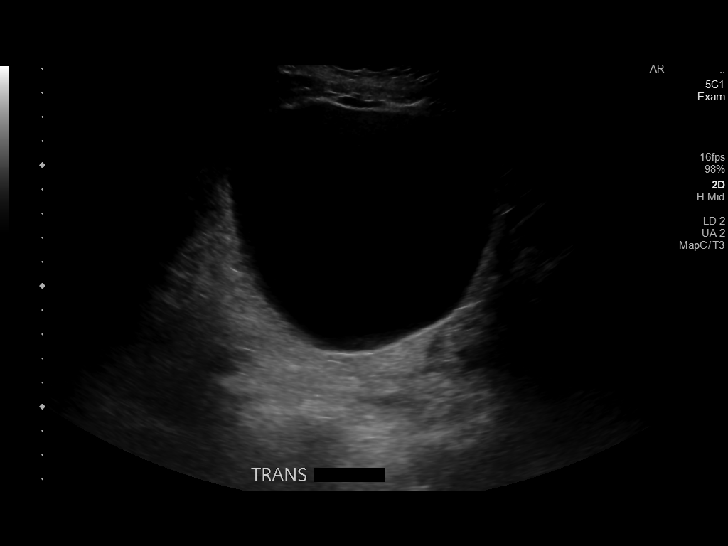
[im 20/58]
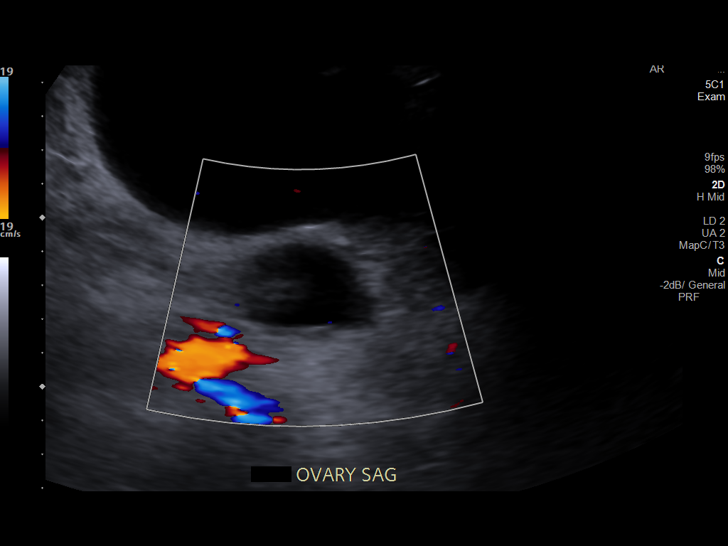
[im 25/58]
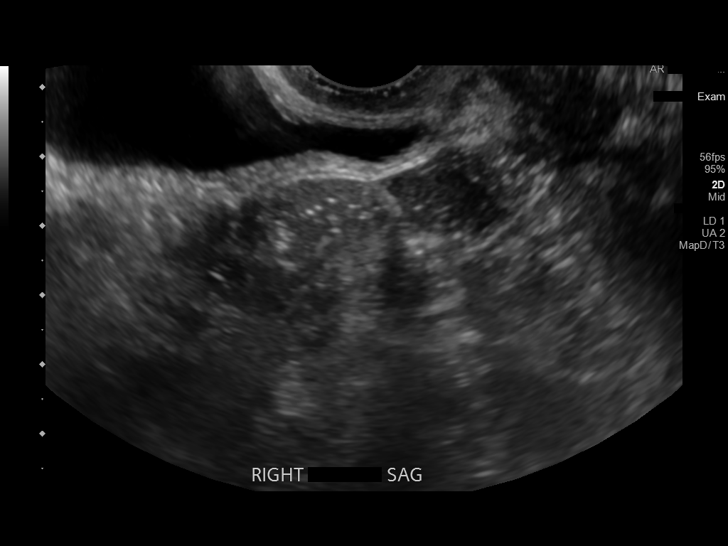
[im 30/58]
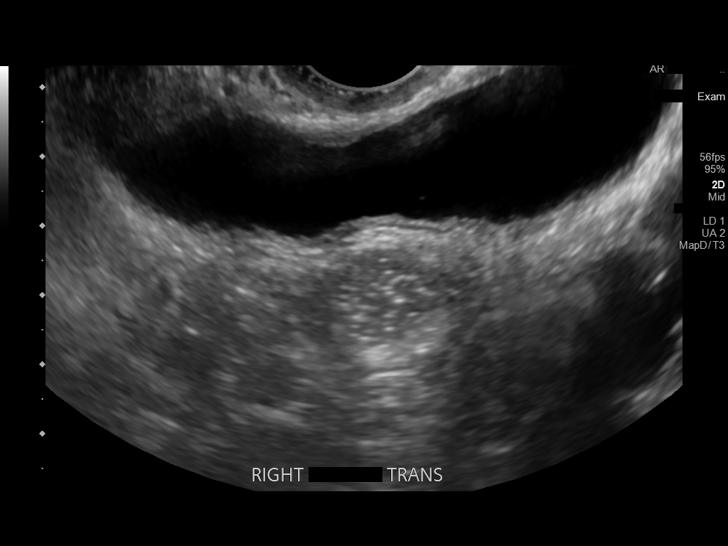
[im 35/58]
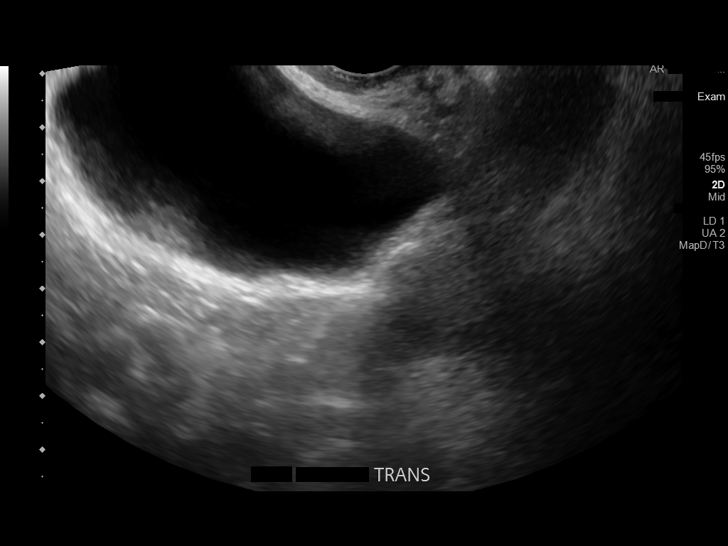
[im 40/58]
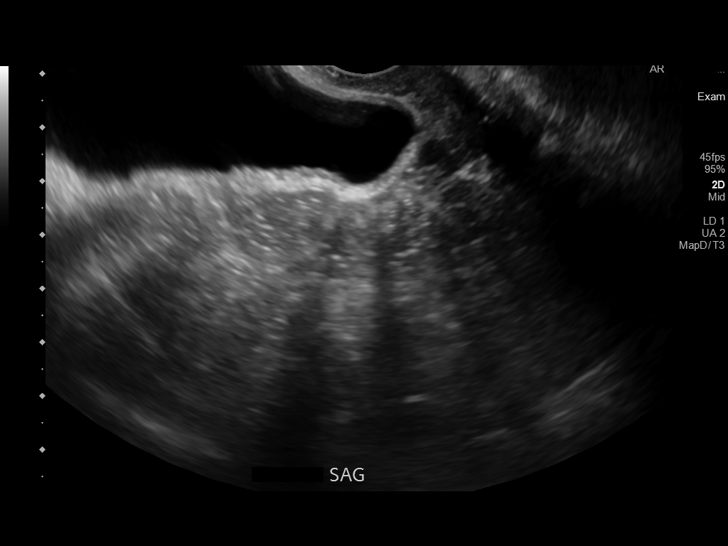
[im 45/58]
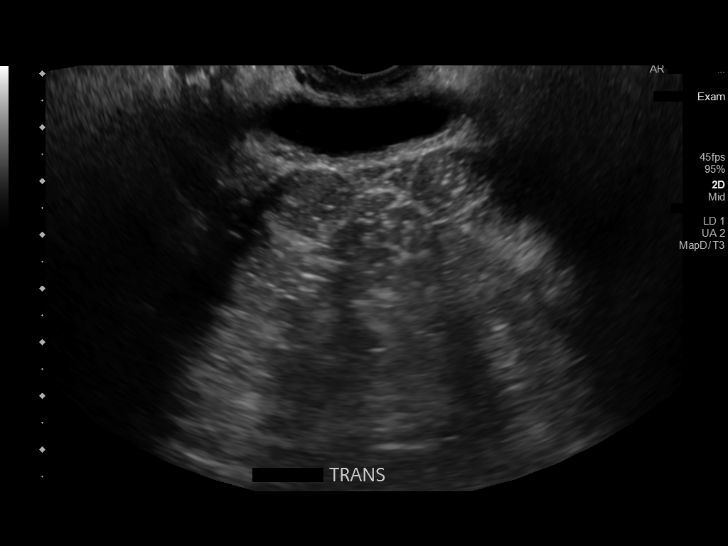
[im 50/58]
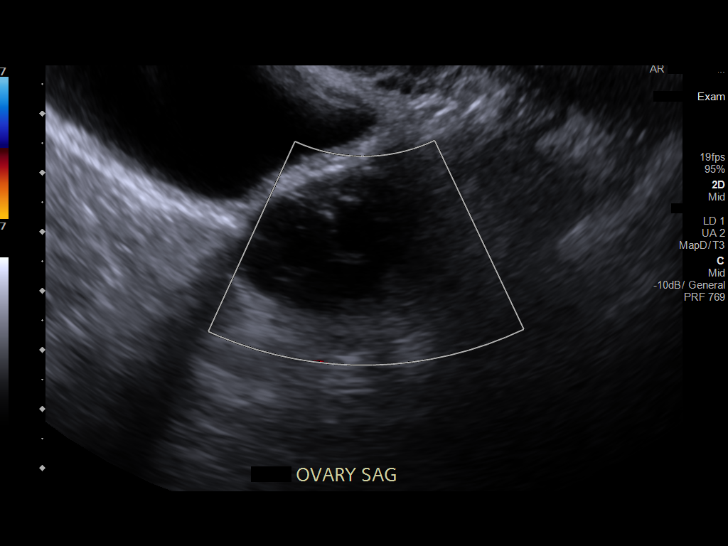
[im 55/58]
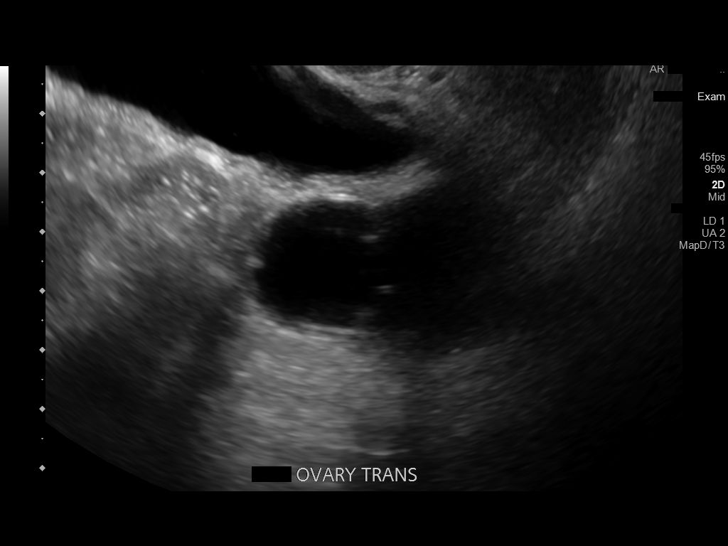

[Series 3: us pelv - us transvaginal · 1 of 2 slices shown (2 of 2)]
[im 1/2]
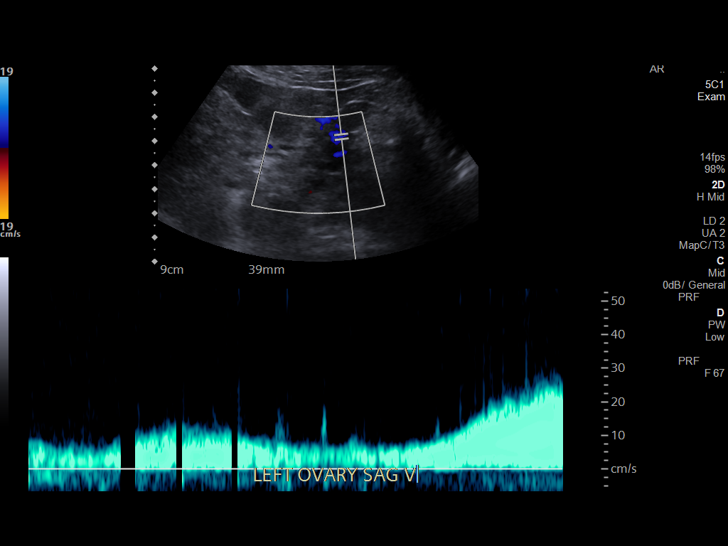

[13 of 25 positions shown; findings below may reference images not displayed]

FINDINGS: Uterus

Surgically absent.  No abnormality at the vaginal cuff.

Endometrium

Surgically absent.

Right ovary

Not visualize, reportedly removed.  No adnexal mass.

Left ovary

Measurements: 5.1 x 3.1 x 5.3 cm. Complex septated cyst measuring
4.3 x 3.2 x 4.9 cm visualized, corresponding with abnormality seen
on prior CT. Single internal septation measuring approximately 2-3
mm present. Additional few scattered low-level internal echoes. No
associated vascularity or definite solid components.

Other findings

No abnormal free fluid.
IMPRESSION: 1. 4.9 cm complex septated left adnexal cyst, with indeterminate
imaging features. Gynecologic referral for further workup
recommended. Additionally, follow-up examination with dedicated
pelvic MRI could be performed for further assessment and
characterization as warranted. No free fluid.
2. Prior hysterectomy and unilateral oophorectomy with
nonvisualization of the uterus and right ovary.

## 2020-02-29 ENCOUNTER — Other Ambulatory Visit: Payer: Self-pay | Admitting: Family Medicine

## 2020-02-29 NOTE — Telephone Encounter (Signed)
Ok to refill??  Last office visit 09/10/2019.  Last refill 11/30/2019.

## 2020-06-09 ENCOUNTER — Other Ambulatory Visit: Payer: Self-pay

## 2020-06-09 ENCOUNTER — Other Ambulatory Visit: Payer: Self-pay | Admitting: Family Medicine

## 2020-06-09 MED ORDER — ESTROGENS CONJUGATED 0.625 MG PO TABS: ORAL_TABLET | ORAL | 5 refills | Status: DC

## 2020-06-09 MED ORDER — ALPRAZOLAM 0.5 MG PO TABS: 0.5000 mg | ORAL_TABLET | Freq: Every day | ORAL | 2 refills | Status: DC | PRN

## 2020-06-09 NOTE — Telephone Encounter (Signed)
Sent to pcp for approval 

## 2020-06-13 NOTE — Telephone Encounter (Signed)
Pls cancel pt appt, Option is not available for me to cancel the pt. Pt will call when she is available to come in.

## 2020-06-15 ENCOUNTER — Ambulatory Visit: Payer: BC Managed Care – PPO | Admitting: Nurse Practitioner

## 2020-09-11 ENCOUNTER — Other Ambulatory Visit: Payer: Self-pay | Admitting: Family Medicine

## 2020-09-12 ENCOUNTER — Other Ambulatory Visit: Payer: Self-pay

## 2020-09-12 ENCOUNTER — Telehealth (INDEPENDENT_AMBULATORY_CARE_PROVIDER_SITE_OTHER): Payer: BC Managed Care – PPO | Admitting: Family Medicine

## 2020-09-12 DIAGNOSIS — F419 Anxiety disorder, unspecified: Secondary | ICD-10-CM

## 2020-09-12 MED ORDER — ALPRAZOLAM 0.5 MG PO TABS: 0.5000 mg | ORAL_TABLET | Freq: Every day | ORAL | 2 refills | Status: DC | PRN

## 2020-09-12 NOTE — Progress Notes (Signed)
   Subjective:    Patient ID: Mary Dominguez, female    DOB: 16-May-1965, 55 y.o.   MRN: [id number]  HPI Patient was seen today as a telephone visit.  Phone call began at 1211.  Phone call concluded at 1220.  The patient states that she was told by my staff that I would not refill her Xanax without an appointment.  However she has a physical exam scheduled in 1 month.  She just does not want her medicine to run out before then.  She consents to be seen via telephone.  She has no other concerns today.  She is currently at home.  I am currently in my office.  Patient takes 1 Xanax every evening to help her sleep.  She has been doing that for several years.  There is no evidence of abuse or diversion.  She has no other concerns that she wants to discuss today.  She has a complete physical exam scheduled in October Past Medical History:  Diagnosis Date   Adnexal mass    LEFT   Bronchitis    09-27-17, TOOK ANTIBIOTICS AND PREDNOSONE NOW RESOLVED   Bronchitis    Former smoker    GERD (gastroesophageal reflux disease)    Hernia, umbilical    Insomnia    Past Surgical History:  Procedure Laterality Date   APPENDECTOMY  AGE 13   COLONOSCOPY WITH PROPOFOL N/A 09/30/2017   Procedure: COLONOSCOPY WITH PROPOFOL;  Surgeon: Danie Binder, MD;  Location: [facility];  Service: Endoscopy;  Laterality: N/A;  2:30pm   OOPHORECTOMY     USO 2002   ROBOTIC ASSISTED SALPINGO OOPHERECTOMY Left 10/08/2017   Procedure: XI ROBOTIC ASSISTED UNILATERAL SALPINGO OOPHORECTOMY;  Surgeon: [name], MD;  Location: [facility];  Service: Gynecology;  Laterality: Left;   VAGINAL HYSTERECTOMY     dysplasia and AUB   Current Outpatient Medications on File Prior to Visit  Medication Sig Dispense Refill   estrogens, conjugated, (PREMARIN) 0.625 MG tablet TAKE 2 TABLETS BY MOUTH EVERY DAY 60 tablet 5   Fluocinolone Acetonide Body (DERMA-SMOOTHE/FS BODY) 0.01 % OIL Apply 1 application topically once a week. 120 mL 1    pantoprazole (PROTONIX) 40 MG tablet Take 1 tablet (40 mg total) by mouth daily. 30 tablet 3   No current facility-administered medications on file prior to visit.      Review of Systems  All other systems reviewed and are negative.     Objective:   Physical Exam        Assessment & Plan:   Anxiety Patient takes 1 Xanax at night to help her go to sleep.  She states that this helps to take the edge off from the anxiety she feels throughout the day and allows her to rest at the right.  She likes this medication because it does not leave her feeling "hung over the next morning and it is not negatively affect her ability to perform her job.  She does not like Ambien or the longer lasting sleep aids.  I will be glad to refill her Xanax.  She is not abusing the medication.  She will not be charged for this visit

## 2020-10-23 ENCOUNTER — Other Ambulatory Visit: Payer: Self-pay | Admitting: Family Medicine

## 2020-10-23 DIAGNOSIS — Z1231 Encounter for screening mammogram for malignant neoplasm of breast: Secondary | ICD-10-CM

## 2020-10-30 ENCOUNTER — Ambulatory Visit (INDEPENDENT_AMBULATORY_CARE_PROVIDER_SITE_OTHER): Payer: BC Managed Care – PPO | Admitting: Family Medicine

## 2020-10-30 ENCOUNTER — Encounter: Payer: Self-pay | Admitting: Family Medicine

## 2020-10-30 ENCOUNTER — Other Ambulatory Visit: Payer: Self-pay

## 2020-10-30 VITALS — BP 114/84 | HR 88 | Temp 97.9°F | Resp 14 | Ht 67.0 in | Wt 155.0 lb

## 2020-10-30 DIAGNOSIS — D0462 Carcinoma in situ of skin of left upper limb, including shoulder: Secondary | ICD-10-CM | POA: Diagnosis not present

## 2020-10-30 DIAGNOSIS — Z122 Encounter for screening for malignant neoplasm of respiratory organs: Secondary | ICD-10-CM

## 2020-10-30 DIAGNOSIS — E7800 Pure hypercholesterolemia, unspecified: Secondary | ICD-10-CM

## 2020-10-30 DIAGNOSIS — Z0000 Encounter for general adult medical examination without abnormal findings: Secondary | ICD-10-CM | POA: Diagnosis not present

## 2020-10-30 DIAGNOSIS — F17200 Nicotine dependence, unspecified, uncomplicated: Secondary | ICD-10-CM

## 2020-10-30 DIAGNOSIS — D049 Carcinoma in situ of skin, unspecified: Secondary | ICD-10-CM | POA: Diagnosis not present

## 2020-10-30 DIAGNOSIS — L989 Disorder of the skin and subcutaneous tissue, unspecified: Secondary | ICD-10-CM

## 2020-10-30 NOTE — Progress Notes (Signed)
Subjective:    Patient ID: Mary Dominguez, female    DOB: April 19, 1965, 55 y.o.   MRN: [id number]  HPI  Patient is a very pleasant 55 year old Caucasian female who is here today for complete physical exam.  Unfortunately she continues to smoke.  She is in the precontemplative phase and is not ready to attempt smoking cessation at the present time.  She is already scheduled her mammogram.  Her last colonoscopy was in 2019 and found 6 polyps.  They recommended a repeat colonoscopy in 2024.  She has had a total abdominal hysterectomy and bilateral oophorectomy performed in various stages.  Therefore she does not require a Pap smear.  She is due for a flu shot, COVID booster, and the shingles vaccine.  She is also due for lung cancer screening.  She has an atypical skin lesion on the dorsum of her left forearm.  It 6 mm in diameter.  It is an erythematous scaly macule with telangiectasias that is tender to palpation.  She states that its been gradually growing. Past Medical History:  Diagnosis Date   Adnexal mass    LEFT   Bronchitis    09-27-17, TOOK ANTIBIOTICS AND PREDNOSONE NOW RESOLVED   Bronchitis    Former smoker    GERD (gastroesophageal reflux disease)    Hernia, umbilical    Insomnia    Past Surgical History:  Procedure Laterality Date   APPENDECTOMY  AGE 78   COLONOSCOPY WITH PROPOFOL N/A 09/30/2017   Procedure: COLONOSCOPY WITH PROPOFOL;  Surgeon: Danie Binder, MD;  [facility]: [facility];  Service: Endoscopy;  Laterality: N/A;  2:30pm   OOPHORECTOMY     USO 2002   ROBOTIC ASSISTED SALPINGO OOPHERECTOMY Left 10/08/2017   Procedure: XI ROBOTIC ASSISTED UNILATERAL SALPINGO OOPHORECTOMY;  Surgeon: [name], MD;  Location: [facility];  Service: Gynecology;  Laterality: Left;   VAGINAL HYSTERECTOMY     dysplasia and AUB   Current Outpatient Medications on File Prior to Visit  Medication Sig Dispense Refill   ALPRAZolam (XANAX) 0.5 MG tablet Take 1 tablet (0.5 mg total) by  mouth daily as needed. 30 tablet 2   doxycycline (VIBRA-TABS) 100 MG tablet Take 100 mg by mouth 2 (two) times daily.     estrogens, conjugated, (PREMARIN) 0.625 MG tablet TAKE 2 TABLETS BY MOUTH EVERY DAY 60 tablet 5   omeprazole (PRILOSEC OTC) 20 MG tablet Take 20 mg by mouth daily.     No current facility-administered medications on file prior to visit.   Allergies  Allergen Reactions   Penicillins Shortness Of Breath    Childhood reaction. NOTES that she is OK with cephalosporins   Percocet [Oxycodone-Acetaminophen] Nausea And Vomiting   Flexeril [Cyclobenzaprine] Other (See Comments)    Lip tingling   Ivp Dye [Iodinated Diagnostic Agents] Itching and Other (See Comments)    Itching, sneezing, will need premeds in future.   Levaquin [Levofloxacin In D5w] Rash   Social History   Socioeconomic History   Marital status: Married    Spouse name: Not on file   Number of children: Not on file   Years of education: Not on file   Highest education level: Not on file  Occupational History   Not on file  Tobacco Use   Smoking status: Every Day    Packs/day: 0.75    Years: 34.00    Pack years: 25.50    Types: Cigarettes   Smokeless tobacco: Never  Vaping Use   Vaping Use:  Never used  Substance and Sexual Activity   Alcohol use: Yes    Comment: twice a week   Drug use: No   Sexual activity: Yes  Other Topics Concern   Not on file  Social History Narrative   Not on file   Social Determinants of Health   Financial Resource Strain: Not on file  Food Insecurity: Not on file  Transportation Needs: Not on file  Physical Activity: Not on file  Stress: Not on file  Social Connections: Not on file  Intimate Partner Violence: Not on file       Review of Systems  All other systems reviewed and are negative.     Objective:   Physical Exam Vitals reviewed.  Constitutional:      General: She is not in acute distress.    Appearance: Normal appearance. She is  well-developed and normal weight. She is not ill-appearing, toxic-appearing or diaphoretic.  HENT:     Head: Normocephalic and atraumatic.     Right Ear: Tympanic membrane and ear canal normal.     Left Ear: Tympanic membrane and ear canal normal.     Nose: Nose normal.     Mouth/Throat:     Mouth: Mucous membranes are dry.     Pharynx: Oropharynx is clear.  Eyes:     Extraocular Movements: Extraocular movements intact.     Conjunctiva/sclera: Conjunctivae normal.     Pupils: Pupils are equal, round, and reactive to light.  Neck:     Vascular: No carotid bruit or JVD.  Cardiovascular:     Rate and Rhythm: Normal rate and regular rhythm.     Heart sounds: Normal heart sounds. No murmur heard.   No friction rub. No gallop.  Pulmonary:     Effort: Pulmonary effort is normal. No respiratory distress.     Breath sounds: Normal breath sounds. No stridor. No wheezing, rhonchi or rales.  Abdominal:     General: Bowel sounds are normal. There is no distension.     Palpations: Abdomen is soft. There is no mass.     Tenderness: There is no abdominal tenderness. There is no guarding or rebound.     Hernia: No hernia is present.  Musculoskeletal:        General: Normal range of motion.       Arms:     Cervical back: Normal range of motion and neck supple. No tenderness.     Right lower leg: No edema.     Left lower leg: No edema.  Lymphadenopathy:     Cervical: No cervical adenopathy.  Skin:    Findings: Lesion present. No rash.  Neurological:     General: No focal deficit present.     Mental Status: She is alert and oriented to person, place, and time. Mental status is at baseline.     Cranial Nerves: No cranial nerve deficit.     Sensory: No sensory deficit.     Motor: No weakness.     Coordination: Coordination normal.     Gait: Gait normal.     Deep Tendon Reflexes: Reflexes normal.  Psychiatric:        Mood and Affect: Mood normal.        Behavior: Behavior normal.         Thought Content: Thought content normal.        Judgment: Judgment normal.   Assessment & Plan:  Pure hypercholesterolemia - Plan: CBC with Differential/Platelet, COMPLETE METABOLIC PANEL WITH GFR, Lipid  panel  Smoker - Plan: CT CHEST LUNG CA SCREEN LOW DOSE W/O CM  Screening for lung cancer - Plan: CT CHEST LUNG CA SCREEN LOW DOSE W/O CM  Skin lesion of left arm - Plan: Pathology Report ([facility])  General medical exam Patient is already scheduled her mammogram.  Her Pap smear is not required.  Colonoscopy is not due until 2024.  I will schedule the patient for lung cancer screening with a CT scan of the chest.  Also check CBC CMP and a fasting lipid panel.  Encouraged her to get a flu shot, COVID booster, and shingles vaccine.  Using sterile technique, I anesthetized the dorsum of the left forearm with 0.1% lidocaine with epinephrine.  I performed a shave biopsy of the entire lesion down to the subcutaneous fascia and sent the lesion to pathology in a labeled container.  Hemostasis was achieved with Drysol and a Band-Aid.

## 2020-10-31 LAB — COMPLETE METABOLIC PANEL WITH GFR
AG Ratio: 1.6 (calc) (ref 1.0–2.5)
ALT: 12 U/L (ref 6–29)
AST: 16 U/L (ref 10–35)
Albumin: 4.1 g/dL (ref 3.6–5.1)
Alkaline phosphatase (APISO): 65 U/L (ref 37–153)
BUN: 11 mg/dL (ref 7–25)
CO2: 27 mmol/L (ref 20–32)
Calcium: 9.7 mg/dL (ref 8.6–10.4)
Chloride: 101 mmol/L (ref 98–110)
Creat: 0.7 mg/dL (ref 0.50–1.03)
Globulin: 2.5 g/dL (calc) (ref 1.9–3.7)
Glucose, Bld: 84 mg/dL (ref 65–99)
Potassium: 4.6 mmol/L (ref 3.5–5.3)
Sodium: 138 mmol/L (ref 135–146)
Total Bilirubin: 0.3 mg/dL (ref 0.2–1.2)
Total Protein: 6.6 g/dL (ref 6.1–8.1)
eGFR: 101 mL/min/{1.73_m2} (ref >=60)

## 2020-10-31 LAB — CBC WITH DIFFERENTIAL/PLATELET
Absolute Monocytes: 530 cells/uL (ref 200–950)
Basophils Absolute: 50 cells/uL (ref 0–200)
Basophils Relative: 0.5 %
Eosinophils Absolute: 110 cells/uL (ref 15–500)
Eosinophils Relative: 1.1 %
HCT: 46.6 % — ABNORMAL HIGH (ref 35.0–45.0)
Hemoglobin: 16 g/dL — ABNORMAL HIGH (ref 11.7–15.5)
Lymphs Abs: 2380 cells/uL (ref 850–3900)
MCH: 32.9 pg (ref 27.0–33.0)
MCHC: 34.3 g/dL (ref 32.0–36.0)
MCV: 95.7 fL (ref 80.0–100.0)
MPV: 11 fL (ref 7.5–12.5)
Monocytes Relative: 5.3 %
Neutro Abs: 6930 cells/uL (ref 1500–7800)
Neutrophils Relative %: 69.3 %
Platelets: 281 10*3/uL (ref 140–400)
RBC: 4.87 10*6/uL (ref 3.80–5.10)
RDW: 12 % (ref 11.0–15.0)
Total Lymphocyte: 23.8 %
WBC: 10 10*3/uL (ref 3.8–10.8)

## 2020-10-31 LAB — LIPID PANEL
Cholesterol: 243 mg/dL — ABNORMAL HIGH (ref <200)
HDL: 53 mg/dL (ref >=50)
Non-HDL Cholesterol (Calc): 190 mg/dL (calc) — ABNORMAL HIGH (ref <130)
Total CHOL/HDL Ratio: 4.6 (calc) (ref <5.0)
Triglycerides: 434 mg/dL — ABNORMAL HIGH (ref <150)

## 2020-11-01 ENCOUNTER — Encounter: Payer: Self-pay | Admitting: Family Medicine

## 2020-11-01 LAB — PATHOLOGY REPORT

## 2020-11-01 LAB — TISSUE SPECIMEN

## 2020-11-06 ENCOUNTER — Other Ambulatory Visit: Payer: Self-pay

## 2020-11-06 ENCOUNTER — Ambulatory Visit (INDEPENDENT_AMBULATORY_CARE_PROVIDER_SITE_OTHER): Payer: BC Managed Care – PPO | Admitting: Family Medicine

## 2020-11-06 VITALS — BP 128/68 | HR 84 | Temp 98.4°F | Resp 14 | Ht 67.0 in | Wt 156.8 lb

## 2020-11-06 DIAGNOSIS — D0462 Carcinoma in situ of skin of left upper limb, including shoulder: Secondary | ICD-10-CM | POA: Diagnosis not present

## 2020-11-06 DIAGNOSIS — L570 Actinic keratosis: Secondary | ICD-10-CM | POA: Diagnosis not present

## 2020-11-06 DIAGNOSIS — C44622 Squamous cell carcinoma of skin of right upper limb, including shoulder: Secondary | ICD-10-CM | POA: Diagnosis not present

## 2020-11-06 DIAGNOSIS — D099 Carcinoma in situ, unspecified: Secondary | ICD-10-CM

## 2020-11-06 NOTE — Progress Notes (Signed)
Subjective:    Patient ID: Mary Dominguez, female    DOB: 1965/05/14, 55 y.o.   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  Here for wider excision.  Past Medical History:  Diagnosis Date   Adnexal mass    LEFT   Bronchitis    09-27-17, TOOK ANTIBIOTICS AND PREDNOSONE NOW RESOLVED   Bronchitis    Former smoker    GERD (gastroesophageal reflux disease)    Hernia, umbilical    Insomnia    Past Surgical History:  Procedure Laterality Date   APPENDECTOMY  AGE 67   COLONOSCOPY WITH PROPOFOL N/A 09/30/2017   Procedure: COLONOSCOPY WITH PROPOFOL;  Surgeon: Danie Binder, MD;  Location: [facility];  Service: Endoscopy;  Laterality: N/A;  2:30pm   OOPHORECTOMY     USO 2002   ROBOTIC ASSISTED SALPINGO OOPHERECTOMY Left 10/08/2017   Procedure: XI ROBOTIC ASSISTED UNILATERAL SALPINGO OOPHORECTOMY;  Surgeon: [name], MD;  Location: [facility];  Service: Gynecology;  Laterality: Left;   VAGINAL HYSTERECTOMY     dysplasia and AUB   Current Outpatient Medications on File Prior to Visit  Medication Sig Dispense Refill   ALPRAZolam (XANAX) 0.5 MG tablet Take 1 tablet (0.5 mg total) by mouth daily as needed. 30 tablet 2   doxycycline (VIBRA-TABS) 100 MG tablet Take 100 mg by mouth 2 (two) times daily.     estrogens, conjugated, (PREMARIN) 0.625 MG tablet TAKE 2 TABLETS BY MOUTH EVERY DAY 60 tablet 5   omeprazole (PRILOSEC OTC) 20 MG tablet Take 20 mg by mouth daily.     No current facility-administered medications on file prior to visit.   Allergies  Allergen Reactions   Penicillins Shortness Of Breath    Childhood reaction. NOTES that she is OK with cephalosporins   Percocet [Oxycodone-Acetaminophen] Nausea And Vomiting   Flexeril [Cyclobenzaprine] Other (See Comments)    Lip tingling   Ivp Dye [Iodinated Diagnostic Agents] Itching and Other (See Comments)     Itching, sneezing, will need premeds in future.   Levaquin [Levofloxacin In D5w] Rash   Social History   Socioeconomic History   Marital status: Married    Spouse name: Not on file   Number of children: Not on file   Years of education: Not on file   Highest education level: Not on file  Occupational History   Not on file  Tobacco Use   Smoking status: Every Day    Packs/day: 0.75    Years: 34.00    Pack years: 25.50    Types: Cigarettes   Smokeless tobacco: Never  Vaping Use   Vaping Use: Never used  Substance and Sexual Activity   Alcohol use: Yes    Comment: twice a week   Drug use: No   Sexual activity: Yes  Other Topics Concern   Not on file  Social History Narrative   Not on file   Social Determinants of Health   Financial Resource Strain: Not on file  Food Insecurity: Not on file  Transportation Needs: Not on file  Physical Activity: Not on file  Stress: Not on file  Social Connections: Not on file  Intimate Partner Violence: Not on file       Review of Systems  All other systems reviewed and are negative.     Objective:   Physical Exam Vitals reviewed.  Constitutional:  Appearance: She is well-developed.  Neck:     Vascular: No JVD.  Cardiovascular:     Rate and Rhythm: Normal rate and regular rhythm.     Heart sounds: Normal heart sounds. No murmur heard.   No friction rub. No gallop.  Pulmonary:     Effort: Pulmonary effort is normal. No respiratory distress.     Breath sounds: No wheezing, rhonchi or rales.  Skin:    Findings: Lesion present.        Assessment & Plan:  Squamous cell carcinoma in situ Area was anesthetized with 0.1% lidocaine with epinephrine.  The patient was then prepped and draped in sterile fashion.  The lesion was removed using an excisional biopsy technique down to the subcutaneous fascia.  An elliptical excision was made 2 cm x 2 cm.  The lesion was sent to pathology in a labeled container.  The skin edges  were then approximated with 3 simple interrupted 3-0 Ethilon sutures.  Wound care was discussed.  Stitches out in 7 days.

## 2020-11-08 LAB — PATHOLOGY REPORT

## 2020-11-08 LAB — TISSUE SPECIMEN

## 2020-11-13 ENCOUNTER — Other Ambulatory Visit: Payer: Self-pay

## 2020-11-13 ENCOUNTER — Ambulatory Visit (INDEPENDENT_AMBULATORY_CARE_PROVIDER_SITE_OTHER): Payer: BC Managed Care – PPO | Admitting: Family Medicine

## 2020-11-13 DIAGNOSIS — Z4802 Encounter for removal of sutures: Secondary | ICD-10-CM

## 2020-11-13 NOTE — Progress Notes (Signed)
Subjective:    Patient ID: Mary Dominguez, female    DOB: September 15, 1965, 55 y.o.   MRN: [id number]  HPI  11/06/20 Recent biopsy of lesion on dorsal left forearm revealed: Squamous cell carcinoma in situ (CIS), focally to  a peripheral biopsy edge.  Here for wider excision.    Area was anesthetized with 0.1% lidocaine with epinephrine.  The patient was then prepped and draped in sterile fashion.  The lesion was removed using an excisional biopsy technique down to the subcutaneous fascia.  An elliptical excision was made 2 cm x 2 cm.  The lesion was sent to pathology in a labeled container.  The skin edges were then approximated with 3 simple interrupted 3-0 Ethilon sutures.  Wound care was discussed.  Stitches out in 7 days.  11/13/20 Here today for suture removal.  3 sutures removed without difficulty.  The wound is clean dry and intact with no evidence of secondary cellulitis.  Biopsy results revealed residual carcinoma in situ as well as changes consistent with an actinic keratoses however margins were free of any malignancy. Past Medical History:  Diagnosis Date   Adnexal mass    LEFT   Bronchitis    09-27-17, TOOK ANTIBIOTICS AND PREDNOSONE NOW RESOLVED   Bronchitis    Former smoker    GERD (gastroesophageal reflux disease)    Hernia, umbilical    Insomnia    Past Surgical History:  Procedure Laterality Date   APPENDECTOMY  AGE 31   COLONOSCOPY WITH PROPOFOL N/A 09/30/2017   Procedure: COLONOSCOPY WITH PROPOFOL;  Surgeon: Danie Binder, MD;  [facility]: [facility];  Service: Endoscopy;  Laterality: N/A;  2:30pm   OOPHORECTOMY     USO 2002   ROBOTIC ASSISTED SALPINGO OOPHERECTOMY Left 10/08/2017   Procedure: XI ROBOTIC ASSISTED UNILATERAL SALPINGO OOPHORECTOMY;  Surgeon: [name], MD;  Location: [facility];  Service: Gynecology;  Laterality: Left;   VAGINAL HYSTERECTOMY     dysplasia and AUB   Current Outpatient Medications on File Prior to Visit  Medication Sig Dispense  Refill   ALPRAZolam (XANAX) 0.5 MG tablet Take 1 tablet (0.5 mg total) by mouth daily as needed. 30 tablet 2   doxycycline (VIBRA-TABS) 100 MG tablet Take 100 mg by mouth 2 (two) times daily.     estrogens, conjugated, (PREMARIN) 0.625 MG tablet TAKE 2 TABLETS BY MOUTH EVERY DAY 60 tablet 5   omeprazole (PRILOSEC OTC) 20 MG tablet Take 20 mg by mouth daily.     No current facility-administered medications on file prior to visit.   Allergies  Allergen Reactions   Penicillins Shortness Of Breath    Childhood reaction. NOTES that she is OK with cephalosporins   Percocet [Oxycodone-Acetaminophen] Nausea And Vomiting   Flexeril [Cyclobenzaprine] Other (See Comments)    Lip tingling   Ivp Dye [Iodinated Diagnostic Agents] Itching and Other (See Comments)    Itching, sneezing, will need premeds in future.   Levaquin [Levofloxacin In D5w] Rash   Social History   Socioeconomic History   Marital status: Married    Spouse name: Not on file   Number of children: Not on file   Years of education: Not on file   Highest education level: Not on file  Occupational History   Not on file  Tobacco Use   Smoking status: Every Day    Packs/day: 0.75    Years: 34.00    Pack years: 25.50    Types: Cigarettes   Smokeless tobacco: Never  Vaping Use   Vaping Use: Never used  Substance and Sexual Activity   Alcohol use: Yes    Comment: twice a week   Drug use: No   Sexual activity: Yes  Other Topics Concern   Not on file  Social History Narrative   Not on file   Social Determinants of Health   Financial Resource Strain: Not on file  Food Insecurity: Not on file  Transportation Needs: Not on file  Physical Activity: Not on file  Stress: Not on file  Social Connections: Not on file  Intimate Partner Violence: Not on file       Review of Systems  All other systems reviewed and are negative.     Objective:   Physical Exam Vitals reviewed.  Constitutional:      Appearance: She is  well-developed.  Neck:     Vascular: No JVD.  Cardiovascular:     Rate and Rhythm: Normal rate and regular rhythm.     Heart sounds: Normal heart sounds. No murmur heard.   No friction rub. No gallop.  Pulmonary:     Effort: Pulmonary effort is normal. No respiratory distress.     Breath sounds: No wheezing, rhonchi or rales.  Skin:    Findings: Lesion present.        Assessment & Plan:  Visit for suture removal 3 sutures removed without difficulty.  Wound care was discussed.  Biopsy margins were free of any malignancy

## 2020-11-16 ENCOUNTER — Ambulatory Visit
Admission: RE | Admit: 2020-11-16 | Discharge: 2020-11-16 | Disposition: A | Discharge status: Home / Self Care | Payer: BC Managed Care – PPO | Source: Ambulatory Visit | Attending: Family Medicine | Admitting: Family Medicine

## 2020-11-16 ENCOUNTER — Other Ambulatory Visit: Payer: Self-pay

## 2020-11-16 DIAGNOSIS — F17210 Nicotine dependence, cigarettes, uncomplicated: Secondary | ICD-10-CM | POA: Diagnosis not present

## 2020-11-16 DIAGNOSIS — Z122 Encounter for screening for malignant neoplasm of respiratory organs: Secondary | ICD-10-CM

## 2020-11-16 DIAGNOSIS — F17200 Nicotine dependence, unspecified, uncomplicated: Secondary | ICD-10-CM

## 2020-11-20 ENCOUNTER — Other Ambulatory Visit: Payer: Self-pay | Admitting: *Deleted

## 2020-11-20 MED ORDER — ASPIRIN 81 MG PO TBEC: 81.0000 mg | DELAYED_RELEASE_TABLET | Freq: Every day | ORAL | 12 refills | Status: DC

## 2020-11-20 MED ORDER — ROSUVASTATIN CALCIUM 20 MG PO TABS
20.0000 mg | ORAL_TABLET | Freq: Every day | ORAL | 3 refills | Status: DC
Start: 2020-11-20 — End: 2021-11-01

## 2020-11-21 ENCOUNTER — Other Ambulatory Visit: Payer: Self-pay | Admitting: *Deleted

## 2020-11-21 MED ORDER — BUPROPION HCL ER (XL) 150 MG PO TB24: 150.0000 mg | ORAL_TABLET | Freq: Every day | ORAL | 1 refills | Status: DC

## 2020-11-27 ENCOUNTER — Ambulatory Visit
Admission: RE | Admit: 2020-11-27 | Discharge: 2020-11-27 | Disposition: A | Discharge status: Home / Self Care | Payer: BC Managed Care – PPO | Source: Ambulatory Visit | Attending: Family Medicine | Admitting: Family Medicine

## 2020-11-27 DIAGNOSIS — Z1231 Encounter for screening mammogram for malignant neoplasm of breast: Secondary | ICD-10-CM

## 2020-11-27 HISTORY — DX: Malignant (primary) neoplasm, unspecified: C80.1

## 2020-12-08 ENCOUNTER — Other Ambulatory Visit: Payer: Self-pay | Admitting: Family Medicine

## 2020-12-11 ENCOUNTER — Other Ambulatory Visit: Payer: Self-pay | Admitting: Family Medicine

## 2020-12-12 ENCOUNTER — Encounter: Payer: Self-pay | Admitting: Family Medicine

## 2020-12-12 ENCOUNTER — Other Ambulatory Visit: Payer: Self-pay

## 2020-12-12 ENCOUNTER — Ambulatory Visit: Payer: BC Managed Care – PPO | Admitting: Family Medicine

## 2020-12-12 VITALS — BP 142/88 | HR 93 | Resp 18 | Ht 67.0 in | Wt 158.0 lb

## 2020-12-12 DIAGNOSIS — S62346A Nondisplaced fracture of base of fifth metacarpal bone, right hand, initial encounter for closed fracture: Secondary | ICD-10-CM | POA: Diagnosis not present

## 2020-12-12 DIAGNOSIS — S93601A Unspecified sprain of right foot, initial encounter: Secondary | ICD-10-CM

## 2020-12-12 NOTE — Progress Notes (Signed)
Subjective:    Patient ID: Mary Dominguez, female    DOB: [birth year], 55 y.o.   MRN: [id number]  HPI  2 weeks ago, the patient was sitting in a chair.  She went to stand up and she felt a pop pain over the lateral aspect of her right foot.  She develops new cystlike masses on the top of her foot over the tarsal metatarsal road.  Specifically these are at the base of the third and fourth metatarsal.  They are spongy.  However she is having pain in that area whenever she stands.  She went to an urgent care with a from an x-ray and saw a small chip fracture at the base of the fifth metatarsal but this is not the area of pain.  She continues to have pain at the base of the third and fourth metatarsals. Past Medical History:  Diagnosis Date   Adnexal mass    LEFT   Bronchitis    09-27-17, TOOK ANTIBIOTICS AND PREDNOSONE NOW RESOLVED   Bronchitis    Cancer ([place])    left forearm skin CA 2022   Former smoker    GERD (gastroesophageal reflux disease)    Hernia, umbilical    Insomnia    Past Surgical History:  Procedure Laterality Date   APPENDECTOMY  AGE 1   COLONOSCOPY WITH PROPOFOL N/A 09/30/2017   Procedure: COLONOSCOPY WITH PROPOFOL;  Surgeon: Danie Binder, MD;  [facility]: [facility];  Service: Endoscopy;  Laterality: N/A;  2:30pm   OOPHORECTOMY     USO 2002   ROBOTIC ASSISTED SALPINGO OOPHERECTOMY Left 10/08/2017   Procedure: XI ROBOTIC ASSISTED UNILATERAL SALPINGO OOPHORECTOMY;  Surgeon: [name], MD;  Location: [facility];  Service: Gynecology;  Laterality: Left;   VAGINAL HYSTERECTOMY     dysplasia and AUB   Current Outpatient Medications on File Prior to Visit  Medication Sig Dispense Refill   ALPRAZolam (XANAX) 0.5 MG tablet TAKE 1 TABLET BY MOUTH DAILY AS NEEDED. 30 tablet 2   aspirin 81 MG EC tablet Take 1 tablet (81 mg total) by mouth daily. Swallow whole. 30 tablet 12   estrogens, conjugated, (PREMARIN) 0.625 MG tablet TAKE 2 TABLETS BY MOUTH EVERY DAY 60 tablet 5    omeprazole (PRILOSEC OTC) 20 MG tablet Take 20 mg by mouth daily.     rosuvastatin (CRESTOR) 20 MG tablet Take 1 tablet (20 mg total) by mouth daily. 90 tablet 3   buPROPion (WELLBUTRIN XL) 150 MG 24 hr tablet Take 1 tablet (150 mg total) by mouth daily. (Patient not taking: Reported on 12/12/2020) 90 tablet 1   No current facility-administered medications on file prior to visit.   Allergies  Allergen Reactions   Penicillins Shortness Of Breath    Childhood reaction. NOTES that she is OK with cephalosporins Childhood reaction. NOTES that she is OK with cephalosporins Childhood reaction. NOTES that she is OK with cephalosporins   Percocet [Oxycodone-Acetaminophen] Nausea And Vomiting   Cyclobenzaprine Other (See Comments)    Lip tingling Other reaction(s): Other Lip tingling Lip tingling   Doxycycline Rash   Ivp Dye [Iodinated Diagnostic Agents] Itching and Other (See Comments)    Itching, sneezing, will need premeds in future.   Levaquin [Levofloxacin In D5w] Rash   Social History   Socioeconomic History   Marital status: Married    Spouse name: Not on file   Number of children: Not on file   Years of education: Not on file   Highest  education level: Not on file  Occupational History   Not on file  Tobacco Use   Smoking status: Every Day    Packs/day: 0.75    Years: 34.00    Pack years: 25.50    Types: Cigarettes   Smokeless tobacco: Never  Vaping Use   Vaping Use: Never used  Substance and Sexual Activity   Alcohol use: Yes    Comment: twice a week   Drug use: No   Sexual activity: Yes  Other Topics Concern   Not on file  Social History Narrative   Not on file   Social Determinants of Health   Financial Resource Strain: Not on file  Food Insecurity: Not on file  Transportation Needs: Not on file  Physical Activity: Not on file  Stress: Not on file  Social Connections: Not on file  Intimate Partner Violence: Not on file       Review of Systems  All  other systems reviewed and are negative.     Objective:   Physical Exam Vitals reviewed.  Constitutional:      Appearance: She is well-developed.  Neck:     Vascular: No JVD.  Cardiovascular:     Rate and Rhythm: Normal rate and regular rhythm.     Heart sounds: Normal heart sounds. No murmur heard.   No friction rub. No gallop.  Pulmonary:     Effort: Pulmonary effort is normal. No respiratory distress.     Breath sounds: No wheezing, rhonchi or rales.  Musculoskeletal:     Right foot: Decreased range of motion. Deformity present.       Feet:  Feet:     Right foot:     Skin integrity: No skin breakdown or warmth.   Assessment & Plan:  Foot sprain, right, initial encounter  Closed nondisplaced fracture of base of fifth metacarpal bone of right hand, initial encounter I believe the patient suffered a sprain to her midfoot and likely tore the ligaments between the third and fourth metatarsals and the adjacent tarsal row.  I believe this is creating ganglion cyst due to the swelling and fluid in that area.  I do not believe the fracture at the base of the fifth metatarsal is the cause of pain.  She would like to see orthopedics for second opinion.  I recommended weightbearing as tolerated in a postop shoe for 3 to 4 weeks I believe the pain will gradually improve.  We will consult orthopedics as requested

## 2020-12-13 ENCOUNTER — Ambulatory Visit (INDEPENDENT_AMBULATORY_CARE_PROVIDER_SITE_OTHER): Payer: BC Managed Care – PPO | Admitting: Orthopaedic Surgery

## 2020-12-13 ENCOUNTER — Encounter: Payer: Self-pay | Admitting: Orthopaedic Surgery

## 2020-12-13 ENCOUNTER — Ambulatory Visit: Payer: Self-pay

## 2020-12-13 VITALS — BP 146/85 | HR 99 | Ht 67.0 in | Wt 158.0 lb

## 2020-12-13 DIAGNOSIS — M25571 Pain in right ankle and joints of right foot: Secondary | ICD-10-CM | POA: Diagnosis not present

## 2020-12-13 DIAGNOSIS — M67479 Ganglion, unspecified ankle and foot: Secondary | ICD-10-CM

## 2020-12-13 NOTE — Progress Notes (Signed)
Office Visit Note   Patient: Mary Dominguez           Date of Birth: [birth year]           MRN: [id number] Visit Date: 12/13/2020              Requested by: Susy Frizzle, MD [address] [place],  [place] [address] PCP: Susy Frizzle, MD   Assessment & Plan: Visit Diagnoses:  1. Pain in right ankle and joints of right foot   2. Ganglion cyst of foot     Plan: We discussed options for treatment including aspiration.  At this point I do not think she needs an MRI scan.  If it enlarges she can return for aspiration.  We discussed the high rate of recurrence with aspiration as well as high rate of recurrence for ganglion in the feet versus wrist with surgical excision.  If it gets larger and gives her more symptoms she can return.  Follow-Up Instructions: No follow-ups on file.   Orders:  Orders Placed This Encounter  Procedures   XR Foot Complete Right   No orders of the defined types were placed in this encounter.     Procedures: No procedures performed   Clinical Data: No additional findings.   Subjective: Chief Complaint  Patient presents with   Right Foot - Pain    HPI 55 year old female new patient visit with injured her right foot with a sprain 2 weeks ago.  Patient states she had been sitting got up and felt sharp pain in her foot noticed not on her foot over the dorsum slightly lateral.  Pain seems to worsen she was seen at urgent care was told she might have a chip fracture.  There is concern for possible fluid leak with a ganglion.  Not as separated into 2 areas.  She states she occasionally gets some burning in her foot.  No instability.  No history of previous ankle problems no rheumatologic conditions.  Former smoker.  Review of Systems all other systems noncontributory to HPI.   Objective: Vital Signs: BP (!) 146/85   Pulse 99   Ht 5\' 7"  (1.702 m)   Wt 158 lb (71.7 kg)   BMI 24.75 kg/m   Physical Exam Constitutional:       Appearance: She is well-developed.  HENT:     Head: Normocephalic.     Right Ear: External ear normal.     Left Ear: External ear normal. There is no impacted cerumen.  Eyes:     Pupils: Pupils are equal, round, and reactive to light.  Neck:     Thyroid: No thyromegaly.     Trachea: No tracheal deviation.  Cardiovascular:     Rate and Rhythm: Normal rate.  Pulmonary:     Effort: Pulmonary effort is normal.  Abdominal:     Palpations: Abdomen is soft.  Musculoskeletal:     Cervical back: No rigidity.  Skin:    General: Skin is warm and dry.  Neurological:     Mental Status: She is alert and oriented to person, place, and time.  Psychiatric:        Behavior: Behavior normal.    Ortho Exam patient has bilobed ganglion over the dorsum of the right foot adjacent to the short extensor muscle belly.  No evidence of Morton's neuroma.  Pulses are normal good capillary refill she has intact toe extension and flexion.  Specialty Comments:  No specialty comments  available.  Imaging: No results found.   PMFS History: Patient Active Problem List   Diagnosis Date Noted   Ganglion cyst of foot 12/18/2020   Adnexal mass 09/25/2017   Encounter for screening colonoscopy 09/25/2017   Smoker 09/01/2013   Chest pain, unspecified 09/01/2013   Insomnia    Former smoker    Past Medical History:  Diagnosis Date   Adnexal mass    LEFT   Bronchitis    09-27-17, TOOK ANTIBIOTICS AND PREDNOSONE NOW RESOLVED   Bronchitis    Cancer ([place])    left forearm skin CA 2022   Former smoker    GERD (gastroesophageal reflux disease)    Hernia, umbilical    Insomnia     Family History  Problem Relation Age of Onset   Cancer Neg Hx        no breast, ovarian, or colon cancer   Colon polyps Neg Hx     Past Surgical History:  Procedure Laterality Date   APPENDECTOMY  AGE 19   COLONOSCOPY WITH PROPOFOL N/A 09/30/2017   Procedure: COLONOSCOPY WITH PROPOFOL;  Surgeon: Danie Binder, MD;  [facility];  Service: Endoscopy;  Laterality: N/A;  2:30pm   OOPHORECTOMY     USO 2002   ROBOTIC ASSISTED SALPINGO OOPHERECTOMY Left 10/08/2017   Procedure: XI ROBOTIC ASSISTED UNILATERAL SALPINGO OOPHORECTOMY;  Surgeon: [name], MD;  Location: [facility];  Service: Gynecology;  Laterality: Left;   VAGINAL HYSTERECTOMY     dysplasia and AUB   Social History   Occupational History   Not on file  Tobacco Use   Smoking status: Every Day    Packs/day: 0.75    Years: 34.00    Pack years: 25.50    Types: Cigarettes   Smokeless tobacco: Never  Vaping Use   Vaping Use: Never used  Substance and Sexual Activity   Alcohol use: Yes    Comment: twice a week   Drug use: No   Sexual activity: Yes

## 2020-12-18 DIAGNOSIS — M67479 Ganglion, unspecified ankle and foot: Secondary | ICD-10-CM | POA: Insufficient documentation

## 2021-03-13 ENCOUNTER — Other Ambulatory Visit: Payer: Self-pay | Admitting: Family Medicine

## 2021-03-15 NOTE — Telephone Encounter (Signed)
LOV 12/12/20 ?Last refill 12/11/20, #30, 2 refills ? ?Please review, thanks! ?

## 2021-04-16 ENCOUNTER — Ambulatory Visit: Payer: BC Managed Care – PPO | Admitting: Family Medicine

## 2021-04-16 VITALS — BP 160/86 | HR 86 | Temp 97.3°F | Ht 67.0 in | Wt 157.8 lb

## 2021-04-16 DIAGNOSIS — D099 Carcinoma in situ, unspecified: Secondary | ICD-10-CM

## 2021-04-16 NOTE — Progress Notes (Signed)
? ?Subjective:  ? ? Patient ID: Mary Dominguez, female    DOB: 1965-05-21, 56 y.o.   MRN: [id number] ? ?HPI  ?11/06/20 ?Recent biopsy of lesion on dorsal left forearm revealed: ?Squamous cell carcinoma in situ (CIS), focally to  ?a peripheral biopsy edge.  ?Here for wider excision.  ? ?At that time, my plan was: ? ?Area was anesthetized with 0.1% lidocaine with epinephrine.  The patient was then prepped and draped in sterile fashion.  The lesion was removed using an excisional biopsy technique down to the subcutaneous fascia.  An elliptical excision was made 2 cm x 2 cm.  The lesion was sent to pathology in a labeled container.  The skin edges were then approximated with 3 simple interrupted 3-0 Ethilon sutures.  Wound care was discussed.  Stitches out in 7 days. ? ?04/16/21 ?Biopsy margins returned clear of any cancerous lesions.  However the patient states that near the site, she is starting to feel the same laser like pain that she felt before.  She feels like cancer may be coming back in that area.  Just lateral to the biopsy site, she reports feeling a rough patch of skin.  Honestly today I am unable to visualize any skin lesion or feel any roughness beyond the scarring.  However the patient would like to see a dermatologist for a annual skin exam and for a full body check given her recent skin cancer diagnosis. ?Past Medical History:  ?Diagnosis Date  ? Adnexal mass   ? LEFT  ? Bronchitis   ? 09-27-17, TOOK ANTIBIOTICS AND PREDNOSONE NOW RESOLVED  ? Bronchitis   ? Cancer [facility])   ? left forearm skin CA 2022  ? Former smoker   ? GERD (gastroesophageal reflux disease)   ? Hernia, umbilical   ? Insomnia   ? ?Past Surgical History:  ?Procedure Laterality Date  ? APPENDECTOMY  AGE 30  ? COLONOSCOPY WITH PROPOFOL N/A 09/30/2017  ? Procedure: COLONOSCOPY WITH PROPOFOL;  Surgeon: Danie Binder, MD;  [facility];  Service: Endoscopy;  Laterality: N/A;  2:30pm  ? OOPHORECTOMY    ? USO 2002  ? ROBOTIC ASSISTED  SALPINGO OOPHERECTOMY Left 10/08/2017  ? Procedure: XI ROBOTIC ASSISTED UNILATERAL SALPINGO OOPHORECTOMY;  Surgeon: [name], MD;  Location: [facility];  Service: Gynecology;  Laterality: Left;  ? VAGINAL HYSTERECTOMY    ? dysplasia and AUB  ? ?Current Outpatient Medications on File Prior to Visit  ?Medication Sig Dispense Refill  ? ALPRAZolam (XANAX) 0.5 MG tablet TAKE 1 TABLET BY MOUTH EVERY DAY AS NEEDED 30 tablet 2  ? aspirin 81 MG EC tablet Take 1 tablet (81 mg total) by mouth daily. Swallow whole. 30 tablet 12  ? buPROPion (WELLBUTRIN XL) 150 MG 24 hr tablet Take 1 tablet (150 mg total) by mouth daily. 90 tablet 1  ? estrogens, conjugated, (PREMARIN) 0.625 MG tablet TAKE 2 TABLETS BY MOUTH EVERY DAY 60 tablet 5  ? omeprazole (PRILOSEC OTC) 20 MG tablet Take 20 mg by mouth daily.    ? rosuvastatin (CRESTOR) 20 MG tablet Take 1 tablet (20 mg total) by mouth daily. 90 tablet 3  ? ?No current facility-administered medications on file prior to visit.  ? ?Allergies  ?Allergen Reactions  ? Penicillins Shortness Of Breath  ?  Childhood reaction. NOTES that she is OK with cephalosporins ?Childhood reaction. NOTES that she is OK with cephalosporins ?Childhood reaction. NOTES that she is OK with cephalosporins  ? Percocet [Oxycodone-Acetaminophen]  Nausea And Vomiting  ? Cyclobenzaprine Other (See Comments)  ?  Lip tingling ?Other reaction(s): Other ?Lip tingling ?Lip tingling  ? Doxycycline Rash  ? Ivp Dye [Iodinated Contrast Media] Itching and Other (See Comments)  ?  Itching, sneezing, will need premeds in future.  ? Levaquin [Levofloxacin In D5w] Rash  ? ?Social History  ? ?Socioeconomic History  ? Marital status: Married  ?  Spouse name: Not on file  ? Number of children: Not on file  ? Years of education: Not on file  ? Highest education level: Not on file  ?Occupational History  ? Not on file  ?Tobacco Use  ? Smoking status: Every Day  ?  Packs/day: 0.75  ?  Years: 34.00  ?  Pack years: 25.50  ?  Types:  Cigarettes  ? Smokeless tobacco: Never  ?Vaping Use  ? Vaping Use: Never used  ?Substance and Sexual Activity  ? Alcohol use: Yes  ?  Comment: twice a week  ? Drug use: No  ? Sexual activity: Yes  ?Other Topics Concern  ? Not on file  ?Social History Narrative  ? Not on file  ? ?Social Determinants of Health  ? ?Financial Resource Strain: Not on file  ?Food Insecurity: Not on file  ?Transportation Needs: Not on file  ?Physical Activity: Not on file  ?Stress: Not on file  ?Social Connections: Not on file  ?Intimate Partner Violence: Not on file  ? ? ? ? ? ?Review of Systems  ?All other systems reviewed and are negative. ? ?   ?Objective:  ? Physical Exam ?Vitals reviewed.  ?Constitutional:   ?   Appearance: She is well-developed.  ?Neck:  ?   Vascular: No JVD.  ?Cardiovascular:  ?   Rate and Rhythm: Normal rate and regular rhythm.  ?   Heart sounds: Normal heart sounds. No murmur heard. ?  No friction rub. No gallop.  ?Pulmonary:  ?   Effort: Pulmonary effort is normal. No respiratory distress.  ?   Breath sounds: No wheezing, rhonchi or rales.  ?Skin: ?   Findings: Lesion present.  ? ?    ?Honestly, on exam I do not appreciate any abnormal skin lesion adjacent to the abnormal biopsy site.  I reassured the patient that her skin appears normal in that area. ?Assessment & Plan:  ?Squamous cell carcinoma in situ ?I do not see any abnormal lesions today on exam adjacent to the scar from the previous biopsy.  I reassured the patient that the skin appears normal.  However I will be happy to arrange a second opinion with a dermatologist and also schedule her for a full-body skin check.  Her blood pressure is high.  I will suggest to the patient to take valsartan 80 mg a day and recheck blood pressure in 1 month. ?

## 2021-06-12 ENCOUNTER — Other Ambulatory Visit: Payer: Self-pay | Admitting: Family Medicine

## 2021-06-12 NOTE — Telephone Encounter (Signed)
Requested medication (s) are due for refill today - yes  Requested medication (s) are on the active medication list -yes  Future visit scheduled -no  Last refill: 03/15/21 #30 2 RF  Notes to clinic: non delegated Rx  Requested Prescriptions  Pending Prescriptions Disp Refills   ALPRAZolam (XANAX) 0.5 MG tablet [Pharmacy Med Name: ALPRAZOLAM 0.5 MG TABLET] 30 tablet 2    Sig: TAKE 1 TABLET BY MOUTH EVERY DAY AS NEEDED     Not Delegated - Psychiatry: Anxiolytics/Hypnotics 2 Failed - 06/12/2021 10:50 AM      Failed - This refill cannot be delegated      Failed - Urine Drug Screen completed in last 360 days      Passed - Patient is not pregnant      Passed - Valid encounter within last 6 months    Recent Outpatient Visits           1 month ago Squamous cell carcinoma in [place], Cammie Mcgee, MD   6 months ago Foot sprain, right, initial encounter   [place] Pickard, Cammie Mcgee, MD   7 months ago Visit for suture removal   [place] Pickard, Cammie Mcgee, MD   7 months ago Pure hypercholesterolemia   [place] [facility] Schaumann, Cammie Mcgee, MD   9 months ago [place] Pickard, Cammie Mcgee, MD                  Requested Prescriptions  Pending Prescriptions Disp Refills   ALPRAZolam (XANAX) 0.5 MG tablet [Pharmacy Med Name: ALPRAZOLAM 0.5 MG TABLET] 30 tablet 2    Sig: TAKE 1 TABLET BY MOUTH EVERY DAY AS NEEDED     Not Delegated - Psychiatry: Anxiolytics/Hypnotics 2 Failed - 06/12/2021 10:50 AM      Failed - This refill cannot be delegated      Failed - Urine Drug Screen completed in last 360 days      Passed - Patient is not pregnant      Passed - Valid encounter within last 6 months    Recent Outpatient Visits           1 month ago Squamous cell carcinoma in [place], Cammie Mcgee, MD   6 months ago Foot sprain, right, initial  encounter   [place] Pickard, Cammie Mcgee, MD   7 months ago Visit for suture removal   [place] Pickard, Cammie Mcgee, MD   7 months ago Pure hypercholesterolemia   [place] [facility] Schaumann, Cammie Mcgee, MD   9 months ago [place], Cammie Mcgee, MD

## 2021-06-13 NOTE — Telephone Encounter (Signed)
Last OV 04/16/21

## 2021-06-21 DIAGNOSIS — X32XXXA Exposure to sunlight, initial encounter: Secondary | ICD-10-CM | POA: Diagnosis not present

## 2021-06-21 DIAGNOSIS — L820 Inflamed seborrheic keratosis: Secondary | ICD-10-CM | POA: Diagnosis not present

## 2021-06-21 DIAGNOSIS — L570 Actinic keratosis: Secondary | ICD-10-CM | POA: Diagnosis not present

## 2021-06-21 DIAGNOSIS — C44519 Basal cell carcinoma of skin of other part of trunk: Secondary | ICD-10-CM | POA: Diagnosis not present

## 2021-07-06 ENCOUNTER — Other Ambulatory Visit: Payer: Self-pay | Admitting: Family Medicine

## 2021-07-06 NOTE — Telephone Encounter (Signed)
Requested medication (s) are due for refill today: yes  Requested medication (s) are on the active medication list: yes  Last refill:  12/08/20 #60 5 RF  Future visit scheduled: no  Notes to clinic:  do not see OV addressing med since 2017-   Requested Prescriptions  Pending Prescriptions Disp Refills   PREMARIN 0.625 MG tablet [Pharmacy Med Name: PREMARIN 0.625 MG TABLET] 60 tablet 5    Sig: TAKE 2 TABLETS BY MOUTH EVERY DAY     OB/GYN:  Estrogens Failed - 07/06/2021  8:34 AM      Failed - Last BP in normal range    BP Readings from Last 1 Encounters:  04/16/21 (!) 160/86         Passed - Mammogram is up-to-date per Health Maintenance      Passed - Valid encounter within last 12 months    Recent Outpatient Visits           2 months ago Squamous cell carcinoma in [place], Cammie Mcgee, MD   6 months ago Foot sprain, right, initial encounter   [place] Pickard, Cammie Mcgee, MD   7 months ago Visit for suture removal   [place] Pickard, Cammie Mcgee, MD   8 months ago Pure hypercholesterolemia   [place] [facility] Schaumann, Cammie Mcgee, MD   9 months ago [place], Cammie Mcgee, MD

## 2021-07-17 ENCOUNTER — Other Ambulatory Visit: Payer: Self-pay

## 2021-07-17 MED ORDER — ESTROGENS CONJUGATED 0.625 MG PO TABS: ORAL_TABLET | ORAL | 5 refills | Status: DC

## 2021-08-16 DIAGNOSIS — Z85828 Personal history of other malignant neoplasm of skin: Secondary | ICD-10-CM | POA: Diagnosis not present

## 2021-08-16 DIAGNOSIS — Z08 Encounter for follow-up examination after completed treatment for malignant neoplasm: Secondary | ICD-10-CM | POA: Diagnosis not present

## 2021-08-29 ENCOUNTER — Other Ambulatory Visit: Payer: Self-pay | Admitting: *Deleted

## 2021-08-29 NOTE — Patient Outreach (Signed)
  Care Coordination   08/29/2021 Name: Mary Dominguez MRN: [id number] DOB: 08-30-1965   Care Coordination Outreach Attempts:  An unsuccessful telephone outreach was attempted today to offer the patient information about available care coordination services as a benefit of their health plan.   Follow Up Plan:  Additional outreach attempts will be made to offer the patient care coordination information and services.   Encounter Outcome:  No Answer  Care Coordination Interventions Activated:  No   Care Coordination Interventions:  No, not indicated    SIG Mckinnley Smithey L. Lavina Hamman, RN, BSN, [place] Coordinator Office number [phone]

## 2021-08-29 NOTE — Patient Outreach (Signed)
  Care Coordination   Initial Visit Note   08/29/2021 Name: RUSSIE GULLEDGE MRN: [id number] DOB: 04/19/1965  KIDA DIGIULIO is a 56 y.o. year old female who sees Pickard, Cammie Mcgee, MD for primary care. I spoke with  Darden Dates by phone today  What matters to the patients health and wellness today?  Does not prefer to participate in care coordination services    Goals Addressed   None     SDOH assessments and interventions completed:  No     Care Coordination Interventions Activated:  No  Care Coordination Interventions:  No, not indicated   Follow up plan: No further intervention required.   Encounter Outcome:  Pt. Refused   Alwyn Cordner L. Lavina Hamman, RN, BSN, [place] Coordinator Office number [phone]

## 2021-09-21 ENCOUNTER — Other Ambulatory Visit: Payer: Self-pay | Admitting: Family Medicine

## 2021-10-15 ENCOUNTER — Other Ambulatory Visit: Payer: BC Managed Care – PPO

## 2021-10-15 DIAGNOSIS — E7800 Pure hypercholesterolemia, unspecified: Secondary | ICD-10-CM

## 2021-10-15 LAB — COMPLETE METABOLIC PANEL WITH GFR
AG Ratio: 1.4 (calc) (ref 1.0–2.5)
ALT: 13 U/L (ref 6–29)
AST: 16 U/L (ref 10–35)
Albumin: 4.1 g/dL (ref 3.6–5.1)
Alkaline phosphatase (APISO): 70 U/L (ref 37–153)
BUN: 13 mg/dL (ref 7–25)
CO2: 28 mmol/L (ref 20–32)
Calcium: 9.1 mg/dL (ref 8.6–10.4)
Chloride: 99 mmol/L (ref 98–110)
Creat: 0.82 mg/dL (ref 0.50–1.03)
Globulin: 2.9 g/dL (calc) (ref 1.9–3.7)
Glucose, Bld: 110 mg/dL — ABNORMAL HIGH (ref 65–99)
Potassium: 4.6 mmol/L (ref 3.5–5.3)
Sodium: 135 mmol/L (ref 135–146)
Total Bilirubin: 0.3 mg/dL (ref 0.2–1.2)
Total Protein: 7 g/dL (ref 6.1–8.1)
eGFR: 83 mL/min/{1.73_m2} (ref >=60)

## 2021-10-15 LAB — CBC WITH DIFFERENTIAL/PLATELET
Absolute Monocytes: 475 cells/uL (ref 200–950)
Basophils Absolute: 38 cells/uL (ref 0–200)
Basophils Relative: 0.4 %
Eosinophils Absolute: 124 cells/uL (ref 15–500)
Eosinophils Relative: 1.3 %
HCT: 46.6 % — ABNORMAL HIGH (ref 35.0–45.0)
Hemoglobin: 16.1 g/dL — ABNORMAL HIGH (ref 11.7–15.5)
Lymphs Abs: 1872 cells/uL (ref 850–3900)
MCH: 33.8 pg — ABNORMAL HIGH (ref 27.0–33.0)
MCHC: 34.5 g/dL (ref 32.0–36.0)
MCV: 97.7 fL (ref 80.0–100.0)
MPV: 11.2 fL (ref 7.5–12.5)
Monocytes Relative: 5 %
Neutro Abs: 6992 cells/uL (ref 1500–7800)
Neutrophils Relative %: 73.6 %
Platelets: 250 10*3/uL (ref 140–400)
RBC: 4.77 10*6/uL (ref 3.80–5.10)
RDW: 11.5 % (ref 11.0–15.0)
Total Lymphocyte: 19.7 %
WBC: 9.5 10*3/uL (ref 3.8–10.8)

## 2021-10-15 LAB — LIPID PANEL
Cholesterol: 261 mg/dL — ABNORMAL HIGH (ref <200)
HDL: 53 mg/dL (ref >=50)
LDL Cholesterol (Calc): 147 mg/dL (calc) — ABNORMAL HIGH
Non-HDL Cholesterol (Calc): 208 mg/dL (calc) — ABNORMAL HIGH (ref <130)
Total CHOL/HDL Ratio: 4.9 (calc) (ref <5.0)
Triglycerides: 395 mg/dL — ABNORMAL HIGH (ref <150)

## 2021-11-01 ENCOUNTER — Encounter: Payer: Self-pay | Admitting: Family Medicine

## 2021-11-01 ENCOUNTER — Ambulatory Visit (INDEPENDENT_AMBULATORY_CARE_PROVIDER_SITE_OTHER): Payer: BC Managed Care – PPO | Admitting: Family Medicine

## 2021-11-01 VITALS — BP 118/64 | HR 82 | Ht 67.0 in | Wt 155.0 lb

## 2021-11-01 DIAGNOSIS — F17200 Nicotine dependence, unspecified, uncomplicated: Secondary | ICD-10-CM | POA: Diagnosis not present

## 2021-11-01 DIAGNOSIS — E7800 Pure hypercholesterolemia, unspecified: Secondary | ICD-10-CM | POA: Diagnosis not present

## 2021-11-01 DIAGNOSIS — Z0000 Encounter for general adult medical examination without abnormal findings: Secondary | ICD-10-CM | POA: Diagnosis not present

## 2021-11-01 DIAGNOSIS — Z122 Encounter for screening for malignant neoplasm of respiratory organs: Secondary | ICD-10-CM | POA: Diagnosis not present

## 2021-11-01 MED ORDER — ROSUVASTATIN CALCIUM 10 MG PO TABS: 10.0000 mg | ORAL_TABLET | Freq: Every day | ORAL | 3 refills | Status: DC

## 2021-11-01 NOTE — Progress Notes (Signed)
Subjective:    Patient ID: Mary Dominguez, female    DOB: [birth year], 56 y.o.   MRN: [id number]  HPI  Patient is a very pleasant 56 year old Caucasian female who is here today for complete physical exam.  Unfortunately she continues to smoke.  She is in the precontemplative phase and is not ready to attempt smoking cessation at the present time.  She schedules her home mammogram in December.  Her colonoscopy is good until next year in 2024.  She does not require Pap smear having had a total abdominal hysterectomy.  She is due for lung cancer screening.  She refuses a flu shot, shingles vaccine, COVID booster.  Her most recent lab work is listed below Lab on 10/15/2021  Component Date Value Ref Range Status   WBC 10/15/2021 9.5  3.8 - 10.8 Thousand/uL Final   RBC 10/15/2021 4.77  3.80 - 5.10 Million/uL Final   Hemoglobin 10/15/2021 16.1 (H)  11.7 - 15.5 g/dL Final   HCT 10/15/2021 46.6 (H)  35.0 - 45.0 % Final   MCV 10/15/2021 97.7  80.0 - 100.0 fL Final   MCH 10/15/2021 33.8 (H)  27.0 - 33.0 pg Final   MCHC 10/15/2021 34.5  32.0 - 36.0 g/dL Final   RDW 10/15/2021 11.5  11.0 - 15.0 % Final   Platelets 10/15/2021 250  140 - 400 Thousand/uL Final   MPV 10/15/2021 11.2  7.5 - 12.5 fL Final   Neutro Abs 10/15/2021 6,992  1,500 - 7,800 cells/uL Final   Lymphs Abs 10/15/2021 1,872  850 - 3,900 cells/uL Final   Absolute Monocytes 10/15/2021 475  200 - 950 cells/uL Final   Eosinophils Absolute 10/15/2021 124  15 - 500 cells/uL Final   Basophils Absolute 10/15/2021 38  0 - 200 cells/uL Final   Neutrophils Relative % 10/15/2021 73.6  % Final   Total Lymphocyte 10/15/2021 19.7  % Final   Monocytes Relative 10/15/2021 5.0  % Final   Eosinophils Relative 10/15/2021 1.3  % Final   Basophils Relative 10/15/2021 0.4  % Final   Cholesterol 10/15/2021 261 (H)  <200 mg/dL Final   HDL 10/15/2021 53  > OR = 50 mg/dL Final   Triglycerides 10/15/2021 395 (H)  <150 mg/dL Final   Comment: . If a non-fasting  specimen was collected, consider repeat triglyceride testing on a fasting specimen if clinically indicated.  Yates Decamp et al. J. of Clin. Lipidol. 5625;6:389-373. Marland Kitchen    LDL Cholesterol (Calc) 10/15/2021 147 (H)  mg/dL (calc) Final   Comment: Reference range: <100 . Desirable range <100 mg/dL for primary prevention;   <70 mg/dL for patients with CHD or diabetic patients  with > or = 2 CHD risk factors. Marland Kitchen LDL-C is now calculated using the Martin-Hopkins  calculation, which is a validated novel method providing  better accuracy than the Friedewald equation in the  estimation of LDL-C.  Cresenciano Genre et al. Annamaria Helling. 4287;681(15): 2061-2068  (http://education.[facility].com/faq/FAQ164)    Total CHOL/HDL Ratio 10/15/2021 4.9  <5.0 (calc) Final   Non-HDL Cholesterol (Calc) 10/15/2021 208 (H)  <130 mg/dL (calc) Final   Comment: For patients with diabetes plus 1 major ASCVD risk  factor, treating to a non-HDL-C goal of <100 mg/dL  (LDL-C of <70 mg/dL) is considered a therapeutic  option.    Glucose, Bld 10/15/2021 110 (H)  65 - 99 mg/dL Final   Comment: .            Fasting reference interval . For someone without known diabetes,  a glucose value between 100 and 125 mg/dL is consistent with prediabetes and should be confirmed with a follow-up test. .    BUN 10/15/2021 13  7 - 25 mg/dL Final   Creat 10/15/2021 0.82  0.50 - 1.03 mg/dL Final   eGFR 10/15/2021 83  > OR = 60 mL/min/1.45m2 Final   BUN/Creatinine Ratio 10/15/2021 SEE NOTE:  6 - 22 (calc) Final   Comment:    Not Reported: BUN and Creatinine are within    reference range. .    Sodium 10/15/2021 135  135 - 146 mmol/L Final   Potassium 10/15/2021 4.6  3.5 - 5.3 mmol/L Final   Chloride 10/15/2021 99  98 - 110 mmol/L Final   CO2 10/15/2021 28  20 - 32 mmol/L Final   Calcium 10/15/2021 9.1  8.6 - 10.4 mg/dL Final   Total Protein 10/15/2021 7.0  6.1 - 8.1 g/dL Final   Albumin 10/15/2021 4.1  3.6 - 5.1 g/dL Final   Globulin  10/15/2021 2.9  1.9 - 3.7 g/dL (calc) Final   AG Ratio 10/15/2021 1.4  1.0 - 2.5 (calc) Final   Total Bilirubin 10/15/2021 0.3  0.2 - 1.2 mg/dL Final   Alkaline phosphatase (APISO) 10/15/2021 70  37 - 153 U/L Final   AST 10/15/2021 16  10 - 35 U/L Final   ALT 10/15/2021 13  6 - 29 U/L Final    Past Medical History:  Diagnosis Date   Adnexal mass    LEFT   Bronchitis    09-27-17, TOOK ANTIBIOTICS AND PREDNOSONE NOW RESOLVED   Bronchitis    Cancer ([facility])    left forearm skin CA 2022   Former smoker    GERD (gastroesophageal reflux disease)    Hernia, umbilical    Insomnia    Past Surgical History:  Procedure Laterality Date   APPENDECTOMY  AGE 24   COLONOSCOPY WITH PROPOFOL N/A 09/30/2017   Procedure: COLONOSCOPY WITH PROPOFOL;  Surgeon: Danie Binder, MD;  [facility];  Service: Endoscopy;  Laterality: N/A;  2:30pm   OOPHORECTOMY     USO 2002   ROBOTIC ASSISTED SALPINGO OOPHERECTOMY Left 10/08/2017   Procedure: XI ROBOTIC ASSISTED UNILATERAL SALPINGO OOPHORECTOMY;  Surgeon: [name], MD;  Location: [facility];  Service: Gynecology;  Laterality: Left;   VAGINAL HYSTERECTOMY     dysplasia and AUB   Current Outpatient Medications on File Prior to Visit  Medication Sig Dispense Refill   ALPRAZolam (XANAX) 0.5 MG tablet TAKE 1 TABLET BY MOUTH EVERY DAY AS NEEDED 30 tablet 1   aspirin 81 MG EC tablet Take 1 tablet (81 mg total) by mouth daily. Swallow whole. 30 tablet 12   buPROPion (WELLBUTRIN XL) 150 MG 24 hr tablet Take 1 tablet (150 mg total) by mouth daily. 90 tablet 1   estrogens, conjugated, (PREMARIN) 0.625 MG tablet TAKE 2 TABLETS BY MOUTH EVERY DAY 60 tablet 5   omeprazole (PRILOSEC OTC) 20 MG tablet Take 20 mg by mouth daily.     rosuvastatin (CRESTOR) 20 MG tablet Take 1 tablet (20 mg total) by mouth daily. 90 tablet 3   No current facility-administered medications on file prior to visit.   Allergies  Allergen Reactions   Penicillins Shortness Of  Breath    Childhood reaction. NOTES that she is OK with cephalosporins Childhood reaction. NOTES that she is OK with cephalosporins Childhood reaction. NOTES that she is OK with cephalosporins   Percocet [Oxycodone-Acetaminophen] Nausea And Vomiting   Cyclobenzaprine  Other (See Comments)    Lip tingling Other reaction(s): Other Lip tingling Lip tingling   Doxycycline Rash   Ivp Dye [Iodinated Contrast Media] Itching and Other (See Comments)    Itching, sneezing, will need premeds in future.   Levaquin [Levofloxacin In D5w] Rash   Social History   Socioeconomic History   Marital status: Married    Spouse name: Not on file   Number of children: Not on file   Years of education: Not on file   Highest education level: Not on file  Occupational History   Not on file  Tobacco Use   Smoking status: Every Day    Packs/day: 0.75    Years: 34.00    Total pack years: 25.50    Types: Cigarettes   Smokeless tobacco: Never  Vaping Use   Vaping Use: Never used  Substance and Sexual Activity   Alcohol use: Yes    Comment: twice a week   Drug use: No   Sexual activity: Yes  Other Topics Concern   Not on file  Social History Narrative   Not on file   Social Determinants of Health   Financial Resource Strain: Not on file  Food Insecurity: Not on file  Transportation Needs: Not on file  Physical Activity: Not on file  Stress: Not on file  Social Connections: Not on file  Intimate Partner Violence: Not on file       Review of Systems  All other systems reviewed and are negative.      Objective:   Physical Exam Vitals reviewed.  Constitutional:      General: She is not in acute distress.    Appearance: Normal appearance. She is well-developed and normal weight. She is not ill-appearing, toxic-appearing or diaphoretic.  HENT:     Head: Normocephalic and atraumatic.     Right Ear: Tympanic membrane and ear canal normal.     Left Ear: Tympanic membrane and ear canal  normal.     Nose: Nose normal.     Mouth/Throat:     Mouth: Mucous membranes are dry.     Pharynx: Oropharynx is clear.  Eyes:     Extraocular Movements: Extraocular movements intact.     Conjunctiva/sclera: Conjunctivae normal.     Pupils: Pupils are equal, round, and reactive to light.  Neck:     Vascular: No carotid bruit or JVD.  Cardiovascular:     Rate and Rhythm: Normal rate and regular rhythm.     Heart sounds: Normal heart sounds. No murmur heard.    No friction rub. No gallop.  Pulmonary:     Effort: Pulmonary effort is normal. No respiratory distress.     Breath sounds: Normal breath sounds. No stridor. No wheezing, rhonchi or rales.  Abdominal:     General: Bowel sounds are normal. There is no distension.     Palpations: Abdomen is soft. There is no mass.     Tenderness: There is no abdominal tenderness. There is no guarding or rebound.     Hernia: No hernia is present.  Musculoskeletal:        General: Normal range of motion.       Arms:     Cervical back: Normal range of motion and neck supple. No tenderness.     Right lower leg: No edema.     Left lower leg: No edema.  Lymphadenopathy:     Cervical: No cervical adenopathy.  Skin:    Findings: Lesion present. No rash.  Neurological:     General: No focal deficit present.     Mental Status: She is alert and oriented to person, place, and time. Mental status is at baseline.     Cranial Nerves: No cranial nerve deficit.     Sensory: No sensory deficit.     Motor: No weakness.     Coordination: Coordination normal.     Gait: Gait normal.     Deep Tendon Reflexes: Reflexes normal.  Psychiatric:        Mood and Affect: Mood normal.        Behavior: Behavior normal.        Thought Content: Thought content normal.        Judgment: Judgment normal.    Assessment & Plan:  Pure hypercholesterolemia  General medical exam  Smoker  Screening for lung cancer Strongly encouraged smoking cessation.  Patient will  schedule her mammogram.  Colonoscopy is up-to-date and Pap smear is not necessary.  Recommended a bone density at age 52.  Recommended 1200 mg a day of calcium and 1000 units a day of vitamin D.  Recommended a flu shot, shingles vaccine, and a COVID booster which she defers at all of these at the present time.  Schedule the patient for CT scan to screen for lung cancer.  Recommended she start Crestor 10 mg a day and recheck cholesterol in 3 months.  We also discussed Veozah for vasomotor symptoms of menopause.  Patient will check on price of the week to get her away from estrogen

## 2021-11-19 ENCOUNTER — Other Ambulatory Visit: Payer: Self-pay | Admitting: Family Medicine

## 2021-11-19 ENCOUNTER — Telehealth: Payer: Self-pay

## 2021-11-19 MED ORDER — NIRMATRELVIR/RITONAVIR (PAXLOVID)TABLET: 3.0000 | ORAL_TABLET | Freq: Two times a day (BID) | ORAL | 0 refills | Status: AC

## 2021-11-19 NOTE — Telephone Encounter (Signed)
Pt aware that RX has been sent in to pharmacy

## 2021-11-19 NOTE — Telephone Encounter (Signed)
Pt also has Covid, got it from her husband. Would like for you to call in Paxlovid,pls if you don't mind

## 2021-11-20 ENCOUNTER — Encounter: Payer: Self-pay | Admitting: Family Medicine

## 2021-12-03 ENCOUNTER — Other Ambulatory Visit: Payer: Self-pay | Admitting: Family Medicine

## 2022-01-03 ENCOUNTER — Inpatient Hospital Stay: Admission: RE | Admit: 2022-01-03 | Payer: BC Managed Care – PPO | Source: Ambulatory Visit

## 2022-02-01 ENCOUNTER — Other Ambulatory Visit: Payer: Self-pay | Admitting: Family Medicine

## 2022-02-01 DIAGNOSIS — Z1231 Encounter for screening mammogram for malignant neoplasm of breast: Secondary | ICD-10-CM

## 2022-02-06 ENCOUNTER — Other Ambulatory Visit: Payer: Self-pay | Admitting: Family Medicine

## 2022-02-07 NOTE — Telephone Encounter (Signed)
Requested Prescriptions  Pending Prescriptions Disp Refills   ALPRAZolam (XANAX) 0.5 MG tablet [Pharmacy Med Name: ALPRAZOLAM 0.5 MG TABLET] 30 tablet 1    Sig: TAKE 1 TABLET BY MOUTH EVERY DAY AS NEEDED     Not Delegated - Psychiatry: Anxiolytics/Hypnotics 2 Failed - 02/06/2022  4:12 PM      Failed - This refill cannot be delegated      Failed - Urine Drug Screen completed in last 360 days      Failed - Valid encounter within last 6 months    Recent Outpatient Visits           9 months ago Squamous cell carcinoma in [place], Cammie Mcgee, MD   1 year ago Foot sprain, right, initial encounter   [place], Cammie Mcgee, MD   1 year ago Visit for suture removal   [place] [facility], Cammie Mcgee, MD   1 year ago Pure hypercholesterolemia   [place] [facility], Cammie Mcgee, MD   1 year ago [place] [facility], Cammie Mcgee, MD              Passed - Patient is not pregnant       estrogens, conjugated, (PREMARIN) 0.625 MG tablet [Pharmacy Med Name: PREMARIN 0.625 MG TABLET] 60 tablet 5    Sig: TAKE 2 TABLETS BY MOUTH EVERY DAY     OB/GYN:  Estrogens Passed - 02/06/2022  4:12 PM      Passed - Mammogram is up-to-date per Health Maintenance      Passed - Last BP in normal range    BP Readings from Last 1 Encounters:  11/01/21 118/64         Passed - Valid encounter within last 12 months    Recent Outpatient Visits           9 months ago Squamous cell carcinoma in [place], Cammie Mcgee, MD   1 year ago Foot sprain, right, initial encounter   [place] Dennard Schaumann Cammie Mcgee, MD   1 year ago Visit for suture removal   [place] Dennard Schaumann Cammie Mcgee, MD   1 year ago Pure hypercholesterolemia   [place] Dennard Schaumann, Cammie Mcgee, MD   1 year ago [place] [facility],  Cammie Mcgee, MD

## 2022-02-07 NOTE — Telephone Encounter (Signed)
Requested medication (s) are due for refill today: yes  Requested medication (s) are on the active medication list: yes    Last refill: 12/04/21  #30  1 refill  Future visit scheduled no  Notes to clinic:Not delegated, please review. Thank you.  Requested Prescriptions  Pending Prescriptions Disp Refills   ALPRAZolam (XANAX) 0.5 MG tablet [Pharmacy Med Name: ALPRAZOLAM 0.5 MG TABLET] 30 tablet 1    Sig: TAKE 1 TABLET BY MOUTH EVERY DAY AS NEEDED     Not Delegated - Psychiatry: Anxiolytics/Hypnotics 2 Failed - 02/06/2022  4:12 PM      Failed - This refill cannot be delegated      Failed - Urine Drug Screen completed in last 360 days      Failed - Valid encounter within last 6 months    Recent Outpatient Visits           9 months ago Squamous cell carcinoma in [place], Cammie Mcgee, MD   1 year ago Foot sprain, right, initial encounter   [place] [facility], Cammie Mcgee, MD   1 year ago Visit for suture removal   [place] [facility], Cammie Mcgee, MD   1 year ago Pure hypercholesterolemia   [place] [facility], Cammie Mcgee, MD   1 year ago [place] [facility], Cammie Mcgee, MD              Passed - Patient is not pregnant      Signed Prescriptions Disp Refills   estrogens, conjugated, (PREMARIN) 0.625 MG tablet 60 tablet 5    Sig: TAKE 2 TABLETS BY MOUTH EVERY DAY     OB/GYN:  Estrogens Passed - 02/06/2022  4:12 PM      Passed - Mammogram is up-to-date per Health Maintenance      Passed - Last BP in normal range    BP Readings from Last 1 Encounters:  11/01/21 118/64         Passed - Valid encounter within last 12 months    Recent Outpatient Visits           9 months ago Squamous cell carcinoma in [place], Cammie Mcgee, MD   1 year ago Foot sprain, right, initial encounter   [place] Susy Frizzle, MD   1  year ago Visit for suture removal   [place] Dennard Schaumann Cammie Mcgee, MD   1 year ago Pure hypercholesterolemia   [place] Dennard Schaumann, Cammie Mcgee, MD   1 year ago [place] [facility], Cammie Mcgee, MD

## 2022-02-25 ENCOUNTER — Other Ambulatory Visit: Payer: BC Managed Care – PPO

## 2022-02-25 DIAGNOSIS — E7800 Pure hypercholesterolemia, unspecified: Secondary | ICD-10-CM | POA: Diagnosis not present

## 2022-02-25 DIAGNOSIS — F17200 Nicotine dependence, unspecified, uncomplicated: Secondary | ICD-10-CM | POA: Diagnosis not present

## 2022-02-26 LAB — CBC WITH DIFFERENTIAL/PLATELET
Absolute Monocytes: 490 cells/uL (ref 200–950)
Basophils Absolute: 59 cells/uL (ref 0–200)
Basophils Relative: 0.6 %
Eosinophils Absolute: 167 cells/uL (ref 15–500)
Eosinophils Relative: 1.7 %
HCT: 48.9 % — ABNORMAL HIGH (ref 35.0–45.0)
Hemoglobin: 16.4 g/dL — ABNORMAL HIGH (ref 11.7–15.5)
Lymphs Abs: 2156 cells/uL (ref 850–3900)
MCH: 32.6 pg (ref 27.0–33.0)
MCHC: 33.5 g/dL (ref 32.0–36.0)
MCV: 97.2 fL (ref 80.0–100.0)
MPV: 10.9 fL (ref 7.5–12.5)
Monocytes Relative: 5 %
Neutro Abs: 6929 cells/uL (ref 1500–7800)
Neutrophils Relative %: 70.7 %
Platelets: 277 10*3/uL (ref 140–400)
RBC: 5.03 10*6/uL (ref 3.80–5.10)
RDW: 11.9 % (ref 11.0–15.0)
Total Lymphocyte: 22 %
WBC: 9.8 10*3/uL (ref 3.8–10.8)

## 2022-02-26 LAB — COMPLETE METABOLIC PANEL WITH GFR
AG Ratio: 1.7 (calc) (ref 1.0–2.5)
ALT: 21 U/L (ref 6–29)
AST: 20 U/L (ref 10–35)
Albumin: 4.3 g/dL (ref 3.6–5.1)
Alkaline phosphatase (APISO): 64 U/L (ref 37–153)
BUN: 11 mg/dL (ref 7–25)
CO2: 28 mmol/L (ref 20–32)
Calcium: 9.6 mg/dL (ref 8.6–10.4)
Chloride: 103 mmol/L (ref 98–110)
Creat: 0.76 mg/dL (ref 0.50–1.03)
Globulin: 2.6 g/dL (calc) (ref 1.9–3.7)
Glucose, Bld: 97 mg/dL (ref 65–99)
Potassium: 4.9 mmol/L (ref 3.5–5.3)
Sodium: 141 mmol/L (ref 135–146)
Total Bilirubin: 0.4 mg/dL (ref 0.2–1.2)
Total Protein: 6.9 g/dL (ref 6.1–8.1)
eGFR: 91 mL/min/{1.73_m2} (ref >=60)

## 2022-02-26 LAB — LIPID PANEL
Cholesterol: 194 mg/dL (ref <200)
HDL: 62 mg/dL (ref >=50)
LDL Cholesterol (Calc): 95 mg/dL (calc)
Non-HDL Cholesterol (Calc): 132 mg/dL (calc) — ABNORMAL HIGH (ref <130)
Total CHOL/HDL Ratio: 3.1 (calc) (ref <5.0)
Triglycerides: 257 mg/dL — ABNORMAL HIGH (ref <150)

## 2022-03-22 ENCOUNTER — Ambulatory Visit
Admission: RE | Admit: 2022-03-22 | Discharge: 2022-03-22 | Disposition: A | Discharge status: Home / Self Care | Payer: BC Managed Care – PPO | Source: Ambulatory Visit | Attending: Family Medicine | Admitting: Family Medicine

## 2022-03-22 DIAGNOSIS — Z1231 Encounter for screening mammogram for malignant neoplasm of breast: Secondary | ICD-10-CM

## 2022-04-08 ENCOUNTER — Other Ambulatory Visit: Payer: Self-pay | Admitting: Family Medicine

## 2022-04-09 NOTE — Telephone Encounter (Signed)
Requested medication (s) are due for refill today - yes  Requested medication (s) are on the active medication list -yes  Future visit scheduled -no  Last refill: 02/07/22 #30 1RF  Notes to clinic: non delegated Rx  Requested Prescriptions  Pending Prescriptions Disp Refills   ALPRAZolam (XANAX) 0.5 MG tablet [Pharmacy Med Name: ALPRAZOLAM 0.5 MG TABLET] 30 tablet 1    Sig: TAKE 1 TABLET BY MOUTH EVERY DAY AS NEEDED     Not Delegated - Psychiatry: Anxiolytics/Hypnotics 2 Failed - 04/08/2022  5:23 PM      Failed - This refill cannot be delegated      Failed - Urine Drug Screen completed in last 360 days      Failed - Valid encounter within last 6 months    Recent Outpatient Visits           11 months ago Squamous cell carcinoma in [place], Cammie Mcgee, MD   1 year ago Foot sprain, right, initial encounter   [place] [facility], Cammie Mcgee, MD   1 year ago Visit for suture removal   [place] [facility], Cammie Mcgee, MD   1 year ago Pure hypercholesterolemia   [place] Pick[facility], Cammie Mcgee, MD   1 year ago [place], Cammie Mcgee, MD              Passed - Patient is not pregnant         Requested Prescriptions  Pending Prescriptions Disp Refills   ALPRAZolam (XANAX) 0.5 MG tablet [Pharmacy Med Name: ALPRAZOLAM 0.5 MG TABLET] 30 tablet 1    Sig: TAKE 1 TABLET BY MOUTH EVERY DAY AS NEEDED     Not Delegated - Psychiatry: Anxiolytics/Hypnotics 2 Failed - 04/08/2022  5:23 PM      Failed - This refill cannot be delegated      Failed - Urine Drug Screen completed in last 360 days      Failed - Valid encounter within last 6 months    Recent Outpatient Visits           11 months ago Squamous cell carcinoma in [place], Warren T, MD   1 year ago Foot sprain, right, initial encounter   [place] [facility],  Cammie Mcgee, MD   1 year ago Visit for suture removal   [place] Dennard Schaumann Cammie Mcgee, MD   1 year ago Pure hypercholesterolemia   [place], Cammie Mcgee, MD   1 year ago [place], Cammie Mcgee, MD              Passed - Patient is not pregnant

## 2022-05-18 ENCOUNTER — Ambulatory Visit
Admission: RE | Admit: 2022-05-18 | Discharge: 2022-05-18 | Disposition: A | Discharge status: Home / Self Care | Payer: BC Managed Care – PPO | Source: Ambulatory Visit | Attending: Nurse Practitioner | Admitting: Nurse Practitioner

## 2022-05-18 VITALS — BP 158/94 | HR 89 | Temp 98.4°F | Resp 18

## 2022-05-18 DIAGNOSIS — L03114 Cellulitis of left upper limb: Secondary | ICD-10-CM | POA: Diagnosis not present

## 2022-05-18 DIAGNOSIS — S40862A Insect bite (nonvenomous) of left upper arm, initial encounter: Secondary | ICD-10-CM | POA: Diagnosis not present

## 2022-05-18 DIAGNOSIS — W57XXXA Bitten or stung by nonvenomous insect and other nonvenomous arthropods, initial encounter: Secondary | ICD-10-CM

## 2022-05-18 MED ORDER — DEXAMETHASONE SODIUM PHOSPHATE 10 MG/ML IJ SOLN
10.0000 mg | INTRAMUSCULAR | Status: AC
Start: 2022-05-18 — End: 2022-05-18
  Administered 2022-05-18: 10 mg via INTRAMUSCULAR

## 2022-05-18 MED ORDER — SULFAMETHOXAZOLE-TRIMETHOPRIM 800-160 MG PO TABS: 1.0000 | ORAL_TABLET | Freq: Two times a day (BID) | ORAL | 0 refills | Status: DC

## 2022-05-18 MED ORDER — SULFAMETHOXAZOLE-TRIMETHOPRIM 800-160 MG PO TABS: 1.0000 | ORAL_TABLET | Freq: Two times a day (BID) | ORAL | 0 refills | Status: AC

## 2022-05-18 NOTE — Discharge Instructions (Signed)
Take medication as prescribed. Recommend over-the-counter Zyrtec, Allegra, or Claritin as needed to help with itching. May take over-the-counter Tylenol or ibuprofen as needed for pain, fever, or general discomfort. Avoid hot baths or showers while symptoms persist.  Recommend taking lukewarm baths. May apply cool cloths to the area to help with itching or discomfort. Recommend applying cool compresses to the area to help with pain, inflammation, and swelling at least 3-4 times daily while symptoms persist. Continue to monitor the area for worsening redness or swelling that goes up the arm or down the arm, drainage, fever, chills or other concerns.  If you develop any of the symptoms, please follow-up in this clinic or in the emergency department immediately for further evaluation. Follow-up as needed.

## 2022-05-18 NOTE — ED Provider Notes (Signed)
[facility]    CSN: [id number] Arrival date & time: 05/18/22  4098      History   Chief Complaint Chief Complaint  Patient presents with   Insect Bite    Potential Manson Passey Recluse Bite. Swollen, hot, red with what appear to be a pimple in the middle. Possible bite date 09May2024. - Entered by patient    HPI ERILYNN HYDEN is a 57 y.o. female.   The history is provided by the patient.   Patient presents for complaints of an insect bite that occurred to the left on the past week.  She states since the symptoms occurred, the area has become swollen, painful, and itching.  She states that she thinks she may have possibly been bitten by "brown recluse" but she did not see what bit her.  She denies fever, chills, pain, abdominal pain, nausea, vomiting.  She states that the area on yesterday.  She states that she did apply Vaseline and a bandage to the area.  She states that it was more swollen 1 day ago.  Past Medical History:  Diagnosis Date   Adnexal mass    LEFT   Bronchitis    09-27-17, TOOK ANTIBIOTICS AND PREDNOSONE NOW RESOLVED   Bronchitis    Cancer ([facility])    left forearm skin CA 2022   Former smoker    GERD (gastroesophageal reflux disease)    Hernia, umbilical    Insomnia     Patient Active Problem List   Diagnosis Date Noted   Ganglion cyst of foot 12/18/2020   Adnexal mass 09/25/2017   Encounter for screening colonoscopy 09/25/2017   Smoker 09/01/2013   Chest pain, unspecified 09/01/2013   Insomnia    Former smoker     Past Surgical History:  Procedure Laterality Date   APPENDECTOMY  AGE 51   COLONOSCOPY WITH PROPOFOL N/A 09/30/2017   Procedure: COLONOSCOPY WITH PROPOFOL;  Surgeon: West Bali, MD;  [facility];  Service: Endoscopy;  Laterality: N/A;  2:30pm   OOPHORECTOMY     USO 2002   ROBOTIC ASSISTED SALPINGO OOPHERECTOMY Left 10/08/2017   Procedure: XI ROBOTIC ASSISTED UNILATERAL SALPINGO OOPHORECTOMY;  Surgeon: Shonna Chock, MD;  Location: [facility];  Service: Gynecology;  Laterality: Left;   VAGINAL HYSTERECTOMY     dysplasia and AUB    OB History   No obstetric history on file.      Home Medications    Prior to Admission medications   Medication Sig Start Date End Date Taking? Authorizing Provider  ALPRAZolam Prudy Feeler) 0.5 MG tablet TAKE 1 TABLET BY MOUTH EVERY DAY AS NEEDED 04/09/22   Donita Brooks, MD  estrogens, conjugated, (PREMARIN) 0.625 MG tablet TAKE 2 TABLETS BY MOUTH EVERY DAY 02/07/22   Donita Brooks, MD  omeprazole (PRILOSEC OTC) 20 MG tablet Take 20 mg by mouth daily.    [provider]  rosuvastatin (CRESTOR) 10 MG tablet Take 1 tablet (10 mg total) by mouth daily. 11/01/21   Donita Brooks, MD    Family History Family History  Problem Relation Age of Onset   Cancer Neg Hx        no breast, ovarian, or colon cancer   Colon polyps Neg Hx     Social History Social History   Tobacco Use   Smoking status: Every Day    Packs/day: 0.75    Years: 34.00    Additional pack years: 0.00    Total pack years:  25.50    Types: Cigarettes   Smokeless tobacco: Never  Vaping Use   Vaping Use: Never used  Substance Use Topics   Alcohol use: Yes    Comment: twice a week   Drug use: No     Allergies   Penicillins, Percocet [oxycodone-acetaminophen], Cyclobenzaprine, Doxycycline, Ivp dye [iodinated contrast media], and Levaquin [levofloxacin in d5w]   Review of Systems Review of Systems Per HPI  Physical Exam Triage Vital Signs ED Triage Vitals  Enc Vitals Group     BP 05/18/22 0944 (!) 158/94     Pulse Rate 05/18/22 0944 89     Resp 05/18/22 0944 18     Temp 05/18/22 0944 98.4 F (36.9 C)     Temp Source 05/18/22 0944 Oral     SpO2 05/18/22 0944 97 %     Weight --      Height --      Head Circumference --      Peak Flow --      Pain Score 05/18/22 0945 9     Pain Loc --      Pain Edu? --      Excl. in GC? --    No data found.  Updated Vital  Signs BP (!) 158/94 (BP Location: Right Arm)   Pulse 89   Temp 98.4 F (36.9 C) (Oral)   Resp 18   SpO2 97%   Visual Acuity Right Eye Distance:   Left Eye Distance:   Bilateral Distance:    Right Eye Near:   Left Eye Near:    Bilateral Near:     Physical Exam Vitals and nursing note reviewed.  Constitutional:      General: She is not in acute distress.    Appearance: Normal appearance.  HENT:     Head: Normocephalic.  Eyes:     Extraocular Movements: Extraocular movements intact.     Conjunctiva/sclera: Conjunctivae normal.     Pupils: Pupils are equal, round, and reactive to light.  Cardiovascular:     Rate and Rhythm: Normal rate and regular rhythm.     Pulses: Normal pulses.     Heart sounds: Normal heart sounds.  Pulmonary:     Effort: Pulmonary effort is normal. No respiratory distress.     Breath sounds: Normal breath sounds. No stridor. No wheezing, rhonchi or rales.  Abdominal:     General: Bowel sounds are normal.     Palpations: Abdomen is soft.     Tenderness: There is no abdominal tenderness.  Musculoskeletal:     Cervical back: Normal range of motion.  Lymphadenopathy:     Cervical: No cervical adenopathy.  Skin:    General: Skin is warm and dry.       Neurological:     General: No focal deficit present.     Mental Status: She is alert and oriented to person, place, and time.  Psychiatric:        Mood and Affect: Mood normal.        Behavior: Behavior normal.      UC Treatments / Results  Labs (all labs ordered are listed, but only abnormal results are displayed) Labs Reviewed - No data to display  EKG   Radiology No results found.  Procedures Procedures (including critical care time)  Medications Ordered in UC Medications - No data to display  Initial Impression / Assessment and Plan / UC Course  I have reviewed the triage vital signs and the nursing notes.  Pertinent labs & imaging results that were available during my care  of the patient were reviewed by me and considered in my medical decision making (see chart for details).  The patient is well-appearing, she is in no acute distress, she is hypertensive, vital signs are otherwise stable.  Symptoms appear to be consistent with cellulitis caused by a possible insect bite.  Will treat patient with Decadron 10 mg IM in the clinic to help with inflammation and itching.  Patient was treated with Bactrim DS 800/160mg  tablets for cellulitis.  Reported care recommendations were provided and discussed with the patient to include use of over the counter analgesics such as Tylenol or ibuprofen, cool compresses, and over-the-counter antihistamine such as Zyrtec with itching.  Patient advised to continue to monitor the area for worsening to include increased redness, swelling, or drainage.  Patient was given strict follow-up precautions if the symptoms develop.  Patient is in agreement with this plan of care and verbalizes understanding.  All questions were answered.  Patient stable for discharge.   Final Clinical Impressions(s) / UC Diagnoses   Final diagnoses:  None   Discharge Instructions   None    ED Prescriptions   None    PDMP not reviewed this encounter.   Abran Cantor, NP 05/18/22 1006

## 2022-05-18 NOTE — ED Triage Notes (Signed)
Insect bite on Tuesday to left upper arm.  States area itches and hurts.  Area red and swollen.   (If she needs an Rx, please print it out).

## 2022-06-07 ENCOUNTER — Other Ambulatory Visit: Payer: Self-pay | Admitting: Family Medicine

## 2022-08-12 ENCOUNTER — Telehealth: Payer: Self-pay | Admitting: Family Medicine

## 2022-08-12 ENCOUNTER — Other Ambulatory Visit: Payer: Self-pay | Admitting: Family Medicine

## 2022-08-12 MED ORDER — ALPRAZOLAM 0.5 MG PO TABS: 0.5000 mg | ORAL_TABLET | Freq: Every day | ORAL | 1 refills | Status: DC | PRN

## 2022-08-12 NOTE — Telephone Encounter (Signed)
Prescription Request  08/12/2022  LOV: 11/01/2021  What is the name of the medication or equipment?   ALPRAZolam (XANAX) 0.5 MG tablet  **New script requested**  Have you contacted your pharmacy to request a refill? Yes   Which pharmacy would you like this sent to?  [id number] [place], [place] - [address] [place] [address] Phone: [phone] Fax: [phone]    Patient notified that their request is being sent to the clinical staff for review and that they should receive a response within 2 business days.   Please advise pharmacist.

## 2022-08-19 ENCOUNTER — Encounter: Payer: Self-pay | Admitting: *Deleted

## 2022-10-11 ENCOUNTER — Other Ambulatory Visit: Payer: Self-pay | Admitting: Family Medicine

## 2022-10-11 NOTE — Telephone Encounter (Signed)
Requested medication (s) are due for refill today: yes  Requested medication (s) are on the active medication list: yes  Last refill:  11/01/21  Future visit scheduled: yes  Notes to clinic:  Unable to refill per protocol, cannot delegate Xanax and lipid labs due. Routing for approval.      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   [facility] Pickard, Priscille Heidelberg, MD   1 year ago Foot sprain, right, initial encounter   [facility] Pickard, Priscille Heidelberg, MD   1 year ago Visit for suture removal   [facility] Pickard, Priscille Heidelberg, MD   1 year ago Pure hypercholesterolemia   [facility] Pickard, Priscille Heidelberg, MD   2 years ago Anxiety   [facility] Pickard, Priscille Heidelberg, MD       Future Appointments             In 1 month Pickard, Priscille Heidelberg, MD [facility], [facility]            Failed - Lipid Panel in normal range within the last 12 months    Cholesterol  Date Value Ref Range Status  02/25/2022 194 <200 mg/dL Final   LDL Cholesterol (Calc)  Date Value Ref Range Status  02/25/2022 95 mg/dL (calc) Final    Comment:    Reference range: <100 . Desirable range <100 mg/dL for primary prevention;   <70 mg/dL for patients with CHD or diabetic patients  with > or = 2 CHD risk factors. Marland Kitchen LDL-C is now calculated using the Martin-Hopkins  calculation, which is a validated novel method providing  better accuracy than the Friedewald equation in the  estimation of LDL-C.  Horald Pollen et al. Lenox Ahr. 4098;119(14): 2061-2068   (http://education.[employer].com/faq/FAQ164)    HDL  Date Value Ref Range Status  02/25/2022 62 > OR = 50 mg/dL Final   Triglycerides  Date Value Ref Range Status  02/25/2022 257 (H) <150 mg/dL Final    Comment:    . If a non-fasting specimen was collected, consider repeat triglyceride testing on a fasting specimen if clinically indicated.  Perry Mount et al. J. of Clin. Lipidol. 2015;9:129-169. Marland Kitchen          Passed - Cr in normal range and within 360 days    Creat  Date Value Ref Range Status  02/25/2022 0.76 0.50 - 1.03 mg/dL Final         Passed - Patient is not pregnant       ALPRAZolam (XANAX) 0.5 MG tablet [Pharmacy Med Name: ALPRAZOLAM 0.5 MG TABLET] 30 tablet 1    Sig: TAKE 1 TABLET BY MOUTH DAILY AS NEEDED.     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   [facility] Pickard, Priscille Heidelberg, MD   1 year ago Foot sprain, right, initial encounter   [facility] Pickard, Priscille Heidelberg, MD   1 year ago Visit for suture removal   [facility] Tanya Nones Priscille Heidelberg, MD   1 year ago Pure hypercholesterolemia   [facility] Tanya Nones, Priscille Heidelberg, MD   2 years ago Anxiety   [facility] Pickard, Priscille Heidelberg, MD       Future Appointments             In 1 month Pickard, Priscille Heidelberg, MD [facility], [facility]            Passed - Patient is not pregnant

## 2022-10-21 ENCOUNTER — Ambulatory Visit: Payer: BC Managed Care – PPO | Admitting: Family Medicine

## 2022-10-21 VITALS — BP 136/82 | HR 90 | Ht 67.0 in | Wt 147.6 lb

## 2022-10-21 DIAGNOSIS — R1011 Right upper quadrant pain: Secondary | ICD-10-CM | POA: Diagnosis not present

## 2022-10-21 DIAGNOSIS — E7800 Pure hypercholesterolemia, unspecified: Secondary | ICD-10-CM

## 2022-10-21 DIAGNOSIS — F17200 Nicotine dependence, unspecified, uncomplicated: Secondary | ICD-10-CM

## 2022-10-21 DIAGNOSIS — R079 Chest pain, unspecified: Secondary | ICD-10-CM | POA: Diagnosis not present

## 2022-10-21 MED ORDER — METOPROLOL SUCCINATE ER 25 MG PO TB24: 25.0000 mg | ORAL_TABLET | Freq: Every day | ORAL | 3 refills | Status: DC

## 2022-10-21 NOTE — Progress Notes (Signed)
Subjective:    Patient ID: Mary Dominguez, female    DOB: 1965-09-08, 57 y.o.   MRN: [id number]  Chest Pain      Patient states for the last month, she has been having chest pain.  For a CAT scan in 2022, the patient was found to have a Jevantique coronary atherosclerosis in the right coronary artery and LAD.  She has not seen cardiology for this.  She continues to smoke.  Approximate 1 month ago she began developing right upper quadrant chest pain.  The pain is located right below the ribs near the liver.  It radiates into the right shoulder blade.  The pain last seconds to a minute.  It is unrelated to exertion.  He does not have any specific trigger.  He does not have any exacerbating or alleviating factors.  She has been having more acid reflux lately and is taking pantoprazole.  She is also having more gas.  However she also has developed episodes of tachycardia.  Her heart rate will periodically start racing to 130 bpm without provocation.  She denies any syncope or near syncope.  She also reports dyspnea on exertion.  EKG today shows normal sinus rhythm.  Patient does have nonspecific ST changes in V4 V5 and V6 with mild downsloping depression.  This is a chronic cough with no region specific.  There is no evidence of any acute ischemia.  Past Medical History:  Diagnosis Date   Adnexal mass    LEFT   Bronchitis    09-27-17, TOOK ANTIBIOTICS AND PREDNOSONE NOW RESOLVED   Bronchitis    Cancer (HCC)    left forearm skin CA 2022   Former smoker    GERD (gastroesophageal reflux disease)    Hernia, umbilical    Insomnia    Past Surgical History:  Procedure Laterality Date   APPENDECTOMY  AGE 67   COLONOSCOPY WITH PROPOFOL N/A 09/30/2017   Procedure: COLONOSCOPY WITH PROPOFOL;  Surgeon: West Bali, MD;  [facility];  Service: Endoscopy;  Laterality: N/A;  2:30pm   OOPHORECTOMY     USO 2002   ROBOTIC ASSISTED SALPINGO OOPHERECTOMY Left 10/08/2017   Procedure: XI  ROBOTIC ASSISTED UNILATERAL SALPINGO OOPHORECTOMY;  Surgeon: Shonna Chock, MD;  Location: [facility];  Service: Gynecology;  Laterality: Left;   VAGINAL HYSTERECTOMY     dysplasia and AUB   Current Outpatient Medications on File Prior to Visit  Medication Sig Dispense Refill   ALPRAZolam (XANAX) 0.5 MG tablet Take 1 tablet (0.5 mg total) by mouth daily as needed. 30 tablet 1   estrogens, conjugated, (PREMARIN) 0.625 MG tablet TAKE 2 TABLETS BY MOUTH EVERY DAY 60 tablet 5   omeprazole (PRILOSEC OTC) 20 MG tablet Take 20 mg by mouth daily.     rosuvastatin (CRESTOR) 10 MG tablet Take 1 tablet (10 mg total) by mouth daily. 90 tablet 3   No current facility-administered medications on file prior to visit.   Allergies  Allergen Reactions   Penicillins Shortness Of Breath    Childhood reaction. NOTES that she is OK with cephalosporins Childhood reaction. NOTES that she is OK with cephalosporins Childhood reaction. NOTES that she is OK with cephalosporins   Percocet [Oxycodone-Acetaminophen] Nausea And Vomiting   Cyclobenzaprine Other (See Comments)    Lip tingling Other reaction(s): Other Lip tingling Lip tingling   Doxycycline Rash   Ivp Dye [Iodinated Contrast Media] Itching and Other (See Comments)    Itching, sneezing, will need premeds  in future.   Levaquin [Levofloxacin In D5w] Rash   Social History   Socioeconomic History   Marital status: Married    Spouse name: Not on file   Number of children: Not on file   Years of education: Not on file   Highest education level: Not on file  Occupational History   Not on file  Tobacco Use   Smoking status: Every Day    Current packs/day: 0.75    Average packs/day: 0.8 packs/day for 34.0 years (25.5 ttl pk-yrs)    Types: Cigarettes   Smokeless tobacco: Never  Vaping Use   Vaping status: Never Used  Substance and Sexual Activity   Alcohol use: Yes    Comment: twice a week   Drug use: No   Sexual activity: Yes  Other Topics  Concern   Not on file  Social History Narrative   Not on file   Social Determinants of Health   Financial Resource Strain: Not on file  Food Insecurity: Not on file  Transportation Needs: Not on file  Physical Activity: Not on file  Stress: Not on file  Social Connections: Not on file  Intimate Partner Violence: Not on file       Review of Systems  Cardiovascular:  Positive for chest pain.  All other systems reviewed and are negative.      Objective:   Physical Exam Vitals reviewed.  Constitutional:      General: She is not in acute distress.    Appearance: Normal appearance. She is well-developed and normal weight. She is not ill-appearing, toxic-appearing or diaphoretic.  HENT:     Head: Normocephalic and atraumatic.  Eyes:     Extraocular Movements: Extraocular movements intact.     Conjunctiva/sclera: Conjunctivae normal.     Pupils: Pupils are equal, round, and reactive to light.  Neck:     Vascular: No carotid bruit or JVD.  Cardiovascular:     Rate and Rhythm: Normal rate and regular rhythm.     Heart sounds: Normal heart sounds. No murmur heard.    No friction rub. No gallop.  Pulmonary:     Effort: Pulmonary effort is normal. No respiratory distress.     Breath sounds: Normal breath sounds. No stridor. No wheezing, rhonchi or rales.  Abdominal:     General: Bowel sounds are normal. There is no distension.     Palpations: Abdomen is soft. There is no mass.     Tenderness: There is no abdominal tenderness. There is no guarding or rebound.     Hernia: No hernia is present.  Musculoskeletal:        General: Normal range of motion.     Cervical back: Normal range of motion and neck supple. No tenderness.     Right lower leg: No edema.     Left lower leg: No edema.  Lymphadenopathy:     Cervical: No cervical adenopathy.  Skin:    Findings: No rash.  Neurological:     General: No focal deficit present.     Mental Status: She is alert and oriented to  person, place, and time. Mental status is at baseline.     Cranial Nerves: No cranial nerve deficit.     Sensory: No sensory deficit.     Motor: No weakness.     Coordination: Coordination normal.     Gait: Gait normal.     Deep Tendon Reflexes: Reflexes normal.  Psychiatric:        Mood and  Affect: Mood normal.        Behavior: Behavior normal.        Thought Content: Thought content normal.        Judgment: Judgment normal.    Assessment & Plan:  Chest pain, unspecified type - Plan: EKG 12-Lead, Ambulatory referral to Cardiology  Pure hypercholesterolemia - Plan: CBC with Differential/Platelet, COMPLETE METABOLIC PANEL WITH GFR, Lipid panel  Smoker  Strongly encouraged smoking cessation.  Chest pain is very atypical for cardiac source.  I suspect a GI source.  Chest pain could reflect a duodenal ulcer.  Also possibly could reflect gallstones.  I will get a right upper quadrant ultrasound to evaluate further.  However I am very concerned about her dyspnea on exertion and episodic tachycardia.  I recommended cardiology consultation.  I believe the patient would benefit from a cardiac monitor to rule out atrial fibrillation.  I also believe she would benefit from a stress test to determine the extent of her coronary artery disease to see if there is any flow-limiting lesions.  Meanwhile begin aspirin 81 mg daily and start Toprol-XL 25 mg daily.

## 2022-10-22 ENCOUNTER — Other Ambulatory Visit: Payer: Self-pay

## 2022-10-22 LAB — COMPLETE METABOLIC PANEL WITH GFR
AG Ratio: 1.8 (calc) (ref 1.0–2.5)
ALT: 33 U/L — ABNORMAL HIGH (ref 6–29)
AST: 24 U/L (ref 10–35)
Albumin: 4.6 g/dL (ref 3.6–5.1)
Alkaline phosphatase (APISO): 72 U/L (ref 37–153)
BUN: 11 mg/dL (ref 7–25)
CO2: 29 mmol/L (ref 20–32)
Calcium: 10 mg/dL (ref 8.6–10.4)
Chloride: 101 mmol/L (ref 98–110)
Creat: 0.79 mg/dL (ref 0.50–1.03)
Globulin: 2.5 g/dL (ref 1.9–3.7)
Glucose, Bld: 107 mg/dL — ABNORMAL HIGH (ref 65–99)
Potassium: 4.6 mmol/L (ref 3.5–5.3)
Sodium: 139 mmol/L (ref 135–146)
Total Bilirubin: 0.6 mg/dL (ref 0.2–1.2)
Total Protein: 7.1 g/dL (ref 6.1–8.1)
eGFR: 87 mL/min/{1.73_m2} (ref >=60)

## 2022-10-22 LAB — CBC WITH DIFFERENTIAL/PLATELET
Absolute Monocytes: 655 {cells}/uL (ref 200–950)
Basophils Absolute: 57 {cells}/uL (ref 0–200)
Basophils Relative: 0.5 %
Eosinophils Absolute: 136 {cells}/uL (ref 15–500)
Eosinophils Relative: 1.2 %
HCT: 49.7 % — ABNORMAL HIGH (ref 35.0–45.0)
Hemoglobin: 16.8 g/dL — ABNORMAL HIGH (ref 11.7–15.5)
Lymphs Abs: 2701 {cells}/uL (ref 850–3900)
MCH: 32.5 pg (ref 27.0–33.0)
MCHC: 33.8 g/dL (ref 32.0–36.0)
MCV: 96.1 fL (ref 80.0–100.0)
MPV: 10.6 fL (ref 7.5–12.5)
Monocytes Relative: 5.8 %
Neutro Abs: 7752 {cells}/uL (ref 1500–7800)
Neutrophils Relative %: 68.6 %
Platelets: 272 10*3/uL (ref 140–400)
RBC: 5.17 10*6/uL — ABNORMAL HIGH (ref 3.80–5.10)
RDW: 11.4 % (ref 11.0–15.0)
Total Lymphocyte: 23.9 %
WBC: 11.3 10*3/uL — ABNORMAL HIGH (ref 3.8–10.8)

## 2022-10-22 LAB — LIPID PANEL
Cholesterol: 210 mg/dL — ABNORMAL HIGH (ref <200)
HDL: 59 mg/dL (ref >=50)
LDL Cholesterol (Calc): 121 mg/dL — ABNORMAL HIGH
Non-HDL Cholesterol (Calc): 151 mg/dL — ABNORMAL HIGH (ref <130)
Total CHOL/HDL Ratio: 3.6 (calc) (ref <5.0)
Triglycerides: 180 mg/dL — ABNORMAL HIGH (ref <150)

## 2022-10-22 MED ORDER — ROSUVASTATIN CALCIUM 40 MG PO TABS: 40.0000 mg | ORAL_TABLET | Freq: Every day | ORAL | 3 refills | Status: DC

## 2022-10-22 NOTE — Telephone Encounter (Signed)
Pt called to check on refilling this med ALPRAZolam (XANAX) 0.5 MG tablet [[id number]]. Pt was seen by pcp 10/21/22, pt stated that received other refills. Please advise.  PHARMACY: [id number] [place], [place] - [address] AT [address] [place] [address] Phone: [phone]  Fax: [phone] DEA #: [id number]  CB#: [phone]

## 2022-10-24 ENCOUNTER — Other Ambulatory Visit: Payer: Self-pay | Admitting: Family Medicine

## 2022-10-24 MED ORDER — ALPRAZOLAM 0.5 MG PO TABS: 0.5000 mg | ORAL_TABLET | Freq: Every day | ORAL | 1 refills | Status: DC | PRN

## 2022-11-01 ENCOUNTER — Ambulatory Visit (HOSPITAL_COMMUNITY)
Admission: RE | Admit: 2022-11-01 | Discharge: 2022-11-01 | Disposition: A | Discharge status: Home / Self Care | Payer: BC Managed Care – PPO | Source: Ambulatory Visit | Attending: Family Medicine | Admitting: Family Medicine

## 2022-11-01 DIAGNOSIS — R1011 Right upper quadrant pain: Secondary | ICD-10-CM | POA: Diagnosis not present

## 2022-11-05 ENCOUNTER — Other Ambulatory Visit: Payer: Self-pay | Admitting: Family Medicine

## 2022-11-05 ENCOUNTER — Other Ambulatory Visit: Payer: Self-pay

## 2022-11-05 DIAGNOSIS — R079 Chest pain, unspecified: Secondary | ICD-10-CM

## 2022-11-05 MED ORDER — PANTOPRAZOLE SODIUM 40 MG PO TBEC: 40.0000 mg | DELAYED_RELEASE_TABLET | Freq: Every day | ORAL | 3 refills | Status: AC

## 2022-11-06 NOTE — Telephone Encounter (Signed)
Requested medication (s) are due for refill today: alternative requested  Requested medication (s) are on the active medication list: yes  Last refill:  11/05/22  Future visit scheduled: yes  Notes to clinic:  Pharmacy comment: Alternative Requested:THE PRESCRIBED MEDICATION IS NOT COVERED BY INSURANCE. PLEASE CONSIDER CHANGING TO ONE OF THE SUGGESTED COVERED ALTERNATIVES.       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   [facility] Pickard, Priscille Heidelberg, MD   1 year ago Foot sprain, right, initial encounter   [facility] Pickard, Priscille Heidelberg, MD   1 year ago Visit for suture removal   [facility] Donita Brooks, MD   2 years ago Pure hypercholesterolemia   [facility] Tanya Nones, Priscille Heidelberg, MD   2 years ago Anxiety   [facility] Pickard, Priscille Heidelberg, MD       Future Appointments             In 1 week Pickard, Priscille Heidelberg, MD [facility], [facility]   In 2 months Branch, Dorothe Pea, MD [facility], [facility]

## 2022-11-18 ENCOUNTER — Encounter: Payer: Self-pay | Admitting: Family Medicine

## 2022-11-18 ENCOUNTER — Ambulatory Visit: Payer: BC Managed Care – PPO | Admitting: Family Medicine

## 2022-11-18 VITALS — BP 132/84 | HR 95 | Temp 98.1°F | Ht 67.0 in | Wt 147.4 lb

## 2022-11-18 DIAGNOSIS — R1011 Right upper quadrant pain: Secondary | ICD-10-CM | POA: Diagnosis not present

## 2022-11-18 DIAGNOSIS — Z0000 Encounter for general adult medical examination without abnormal findings: Secondary | ICD-10-CM

## 2022-11-18 DIAGNOSIS — Z0001 Encounter for general adult medical examination with abnormal findings: Secondary | ICD-10-CM | POA: Diagnosis not present

## 2022-11-18 DIAGNOSIS — E7800 Pure hypercholesterolemia, unspecified: Secondary | ICD-10-CM | POA: Diagnosis not present

## 2022-11-18 DIAGNOSIS — F17200 Nicotine dependence, unspecified, uncomplicated: Secondary | ICD-10-CM

## 2022-11-18 DIAGNOSIS — Z23 Encounter for immunization: Secondary | ICD-10-CM | POA: Diagnosis not present

## 2022-11-18 NOTE — Addendum Note (Signed)
Addended by: Venia Carbon K on: 11/18/2022 11:55 AM   Modules accepted: Orders

## 2022-11-18 NOTE — Progress Notes (Signed)
Subjective:    Patient ID: Mary Dominguez, female    DOB: 03/17/1965, 57 y.o.   MRN: [id number]  HPI  Patient is a very pleasant 57 year old Caucasian female who is here today for complete physical exam.  I last saw the patient approximately 1 month ago.  At that time she was having right upper quadrant abdominal pain.  I performed a right upper quadrant ultrasound that showed steatosis but no evidence of gallstones.  She has been taking pantoprazole for the last month but she has not had any additional pain in the last 3 weeks.  Therefore I anticipate that the patient was treating some type of ulcer or irritation in the lining of her stomach.  Acid.  She has an appointment to see cardiology in January which I believe is sufficient.  She has known coronary artery disease seen on lung cancer screening CT scan 2 years ago.  I would like to risk stratify the patient further.  She continues to smoke and is in the precontemplative phase.  Recently I increased her statin due to her coronary artery disease.  I will to recheck her cholesterol and her liver test in 3 months.  We discussed this.  She is due for a flu shot.  She is due for shingles shot.  She is due for the pneumonia shot.  Her mammogram was in March and is up-to-date.  She is due for a colonoscopy.  Because of her history of a hysterectomy, she does not require Pap smear.   Past Medical History:  Diagnosis Date   Adnexal mass    LEFT   Bronchitis    09-27-17, TOOK ANTIBIOTICS AND PREDNOSONE NOW RESOLVED   Bronchitis    Cancer (HCC)    left forearm skin CA 2022   Former smoker    GERD (gastroesophageal reflux disease)    Hernia, umbilical    Insomnia    Past Surgical History:  Procedure Laterality Date   APPENDECTOMY  AGE 37   COLONOSCOPY WITH PROPOFOL N/A 09/30/2017   Procedure: COLONOSCOPY WITH PROPOFOL;  Surgeon: West Bali, MD;  [facility];  Service: Endoscopy;  Laterality: N/A;  2:30pm   OOPHORECTOMY     USO  2002   ROBOTIC ASSISTED SALPINGO OOPHERECTOMY Left 10/08/2017   Procedure: XI ROBOTIC ASSISTED UNILATERAL SALPINGO OOPHORECTOMY;  Surgeon: Shonna Chock, MD;  Location: [facility];  Service: Gynecology;  Laterality: Left;   VAGINAL HYSTERECTOMY     dysplasia and AUB   Current Outpatient Medications on File Prior to Visit  Medication Sig Dispense Refill   ALPRAZolam (XANAX) 0.5 MG tablet Take 1 tablet (0.5 mg total) by mouth daily as needed. 30 tablet 1   metoprolol succinate (TOPROL-XL) 25 MG 24 hr tablet Take 1 tablet (25 mg total) by mouth daily. 90 tablet 3   pantoprazole (PROTONIX) 40 MG tablet Take 1 tablet (40 mg total) by mouth daily. 30 tablet 3   rosuvastatin (CRESTOR) 40 MG tablet Take 1 tablet (40 mg total) by mouth daily. 90 tablet 3   No current facility-administered medications on file prior to visit.   Allergies  Allergen Reactions   Penicillins Shortness Of Breath    Childhood reaction. NOTES that she is OK with cephalosporins Childhood reaction. NOTES that she is OK with cephalosporins Childhood reaction. NOTES that she is OK with cephalosporins   Percocet [Oxycodone-Acetaminophen] Nausea And Vomiting   Cyclobenzaprine Other (See Comments)    Lip tingling Other reaction(s): Other Lip tingling  Lip tingling   Doxycycline Rash   Ivp Dye [Iodinated Contrast Media] Itching and Other (See Comments)    Itching, sneezing, will need premeds in future.   Levaquin [Levofloxacin In D5w] Rash   Social History   Socioeconomic History   Marital status: Married    Spouse name: Not on file   Number of children: Not on file   Years of education: Not on file   Highest education level: Not on file  Occupational History   Not on file  Tobacco Use   Smoking status: Every Day    Current packs/day: 0.75    Average packs/day: 0.8 packs/day for 34.0 years (25.5 ttl pk-yrs)    Types: Cigarettes   Smokeless tobacco: Never  Vaping Use   Vaping status: Never Used  Substance and  Sexual Activity   Alcohol use: Yes    Comment: twice a week   Drug use: No   Sexual activity: Yes  Other Topics Concern   Not on file  Social History Narrative   Not on file   Social Determinants of Health   Financial Resource Strain: Not on file  Food Insecurity: Not on file  Transportation Needs: Not on file  Physical Activity: Not on file  Stress: Not on file  Social Connections: Not on file  Intimate Partner Violence: Not on file       Review of Systems  All other systems reviewed and are negative.      Objective:   Physical Exam Vitals reviewed.  Constitutional:      General: She is not in acute distress.    Appearance: Normal appearance. She is well-developed and normal weight. She is not ill-appearing, toxic-appearing or diaphoretic.  HENT:     Head: Normocephalic and atraumatic.     Right Ear: Tympanic membrane and ear canal normal.     Left Ear: Tympanic membrane and ear canal normal.     Nose: Nose normal.     Mouth/Throat:     Mouth: Mucous membranes are moist.     Pharynx: Oropharynx is clear.  Eyes:     Extraocular Movements: Extraocular movements intact.     Conjunctiva/sclera: Conjunctivae normal.     Pupils: Pupils are equal, round, and reactive to light.  Neck:     Vascular: No carotid bruit or JVD.  Cardiovascular:     Rate and Rhythm: Normal rate and regular rhythm.     Heart sounds: Normal heart sounds. No murmur heard.    No friction rub. No gallop.  Pulmonary:     Effort: Pulmonary effort is normal. No respiratory distress.     Breath sounds: Normal breath sounds. No stridor. No wheezing, rhonchi or rales.  Abdominal:     General: Bowel sounds are normal. There is no distension.     Palpations: Abdomen is soft. There is no mass.     Tenderness: There is no abdominal tenderness. There is no guarding or rebound.     Hernia: No hernia is present.  Musculoskeletal:        General: Normal range of motion.     Cervical back: Normal range  of motion and neck supple. No tenderness.     Right lower leg: No edema.     Left lower leg: No edema.  Lymphadenopathy:     Cervical: No cervical adenopathy.  Neurological:     General: No focal deficit present.     Mental Status: She is alert and oriented to person, place, and time.  Mental status is at baseline.     Cranial Nerves: No cranial nerve deficit.     Sensory: No sensory deficit.     Motor: No weakness.     Coordination: Coordination normal.     Gait: Gait normal.     Deep Tendon Reflexes: Reflexes normal.  Psychiatric:        Mood and Affect: Mood normal.        Behavior: Behavior normal.        Thought Content: Thought content normal.        Judgment: Judgment normal.    Assessment & Plan:  Smoker  Pure hypercholesterolemia  General medical exam  RUQ pain My biggest concern for her long-term wrist is hurting and coronary artery disease.  I strongly encouraged the patient to quit smoking.  I also want her to see cardiology in January for a stress test.  She is due for lung cancer screening but we will wait until after she sees the cardiologist to determine when to schedule a CT scan of the lung.  Her mammogram is up-to-date.  I recommended she contact her gastroenterologist to schedule her colonoscopy.  She does not require Pap smear.  She received her flu shot today.  She defers the shingles shot and the pneumonia shot to a later date.  I believe the right upper quadrant pain was likely due to gastritis/GERD.  It is responded well to pantoprazole.  There was no evidence of any gallstones.  Repeat CMP and lipid panel in 3 months.  Goal LDL cholesterol is less than 70.

## 2022-11-24 ENCOUNTER — Other Ambulatory Visit: Payer: Self-pay | Admitting: Family Medicine

## 2022-11-25 NOTE — Telephone Encounter (Signed)
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   [facility] Pickard, Priscille Heidelberg, MD   1 year ago Foot sprain, right, initial encounter   [facility] Pickard, Priscille Heidelberg, MD   2 years ago Visit for suture removal   [facility] Tanya Nones, Priscille Heidelberg, MD   2 years ago Pure hypercholesterolemia   [facility] Pickard, Priscille Heidelberg, MD   2 years ago Anxiety   [facility] Pickard, Priscille Heidelberg, MD       Future Appointments             In 1 month Branch, Dorothe Pea, MD [facility] [place] [facility]   In 12 months Pickard, Priscille Heidelberg, MD [facility], [facility]            Failed - Lipid Panel in normal range within the last 12 months    Cholesterol  Date Value Ref Range Status  10/21/2022 210 (H) <200 mg/dL Final   LDL Cholesterol (Calc)  Date Value Ref Range Status  10/21/2022 121 (H) mg/dL (calc) Final    Comment:    Reference range: <100 . Desirable range <100 mg/dL for primary prevention;   <70 mg/dL for patients with CHD or diabetic patients  with > or = 2 CHD risk factors. Marland Kitchen LDL-C is now calculated using the Martin-Hopkins  calculation, which is a validated novel method providing  better accuracy than the Friedewald equation in the  estimation of LDL-C.  Horald Pollen et al. Lenox Ahr. 1610;960(45): 2061-2068  (http://education.[facility].com/faq/FAQ164)    HDL  Date Value Ref Range Status  10/21/2022 59 > OR = 50 mg/dL Final   Triglycerides  Date Value Ref Range Status  10/21/2022 180 (H) <150  mg/dL Final         Passed - Cr in normal range and within 360 days    Creat  Date Value Ref Range Status  10/21/2022 0.79 0.50 - 1.03 mg/dL Final         Passed - Patient is not pregnant

## 2022-12-11 ENCOUNTER — Other Ambulatory Visit: Payer: Self-pay

## 2022-12-11 ENCOUNTER — Emergency Department (HOSPITAL_COMMUNITY): Payer: BC Managed Care – PPO

## 2022-12-11 ENCOUNTER — Observation Stay (HOSPITAL_COMMUNITY)
Admission: EM | Admit: 2022-12-11 | Discharge: 2022-12-12 | Disposition: A | Discharge status: Home / Self Care | Payer: BC Managed Care – PPO | Attending: Family Medicine | Admitting: Family Medicine

## 2022-12-11 ENCOUNTER — Telehealth: Payer: Self-pay

## 2022-12-11 ENCOUNTER — Encounter (HOSPITAL_COMMUNITY): Payer: Self-pay | Admitting: Emergency Medicine

## 2022-12-11 DIAGNOSIS — R42 Dizziness and giddiness: Secondary | ICD-10-CM | POA: Diagnosis not present

## 2022-12-11 DIAGNOSIS — I10 Essential (primary) hypertension: Secondary | ICD-10-CM | POA: Diagnosis not present

## 2022-12-11 DIAGNOSIS — R079 Chest pain, unspecified: Secondary | ICD-10-CM | POA: Diagnosis not present

## 2022-12-11 DIAGNOSIS — R9431 Abnormal electrocardiogram [ECG] [EKG]: Secondary | ICD-10-CM | POA: Diagnosis not present

## 2022-12-11 DIAGNOSIS — F17210 Nicotine dependence, cigarettes, uncomplicated: Secondary | ICD-10-CM | POA: Insufficient documentation

## 2022-12-11 DIAGNOSIS — R569 Unspecified convulsions: Secondary | ICD-10-CM | POA: Diagnosis not present

## 2022-12-11 DIAGNOSIS — Z85828 Personal history of other malignant neoplasm of skin: Secondary | ICD-10-CM | POA: Diagnosis not present

## 2022-12-11 DIAGNOSIS — R002 Palpitations: Secondary | ICD-10-CM | POA: Diagnosis not present

## 2022-12-11 DIAGNOSIS — R0689 Other abnormalities of breathing: Secondary | ICD-10-CM | POA: Diagnosis not present

## 2022-12-11 DIAGNOSIS — Z79899 Other long term (current) drug therapy: Secondary | ICD-10-CM | POA: Insufficient documentation

## 2022-12-11 DIAGNOSIS — G459 Transient cerebral ischemic attack, unspecified: Secondary | ICD-10-CM | POA: Diagnosis not present

## 2022-12-11 DIAGNOSIS — R410 Disorientation, unspecified: Secondary | ICD-10-CM | POA: Diagnosis not present

## 2022-12-11 LAB — URINALYSIS, ROUTINE W REFLEX MICROSCOPIC
Bilirubin Urine: NEGATIVE
Glucose, UA: NEGATIVE mg/dL
Ketones, ur: NEGATIVE mg/dL
Leukocytes,Ua: NEGATIVE
Nitrite: NEGATIVE
Protein, ur: NEGATIVE mg/dL
Specific Gravity, Urine: 1.004 — ABNORMAL LOW (ref 1.005–1.030)
pH: 7 (ref 5.0–8.0)

## 2022-12-11 LAB — CBC WITH DIFFERENTIAL/PLATELET
Abs Immature Granulocytes: 0.04 10*3/uL (ref 0.00–0.07)
Basophils Absolute: 0.1 10*3/uL (ref 0.0–0.1)
Basophils Relative: 1 %
Eosinophils Absolute: 0.3 10*3/uL (ref 0.0–0.5)
Eosinophils Relative: 2 %
HCT: 47.9 % — ABNORMAL HIGH (ref 36.0–46.0)
Hemoglobin: 16.3 g/dL — ABNORMAL HIGH (ref 12.0–15.0)
Immature Granulocytes: 0 %
Lymphocytes Relative: 23 %
Lymphs Abs: 2.8 10*3/uL (ref 0.7–4.0)
MCH: 32.9 pg (ref 26.0–34.0)
MCHC: 34 g/dL (ref 30.0–36.0)
MCV: 96.6 fL (ref 80.0–100.0)
Monocytes Absolute: 0.7 10*3/uL (ref 0.1–1.0)
Monocytes Relative: 6 %
Neutro Abs: 8.3 10*3/uL — ABNORMAL HIGH (ref 1.7–7.7)
Neutrophils Relative %: 68 %
Platelets: 234 10*3/uL (ref 150–400)
RBC: 4.96 MIL/uL (ref 3.87–5.11)
RDW: 12 % (ref 11.5–15.5)
WBC: 12.2 10*3/uL — ABNORMAL HIGH (ref 4.0–10.5)
nRBC: 0 % (ref 0.0–0.2)

## 2022-12-11 LAB — COMPREHENSIVE METABOLIC PANEL
ALT: 49 U/L — ABNORMAL HIGH (ref 0–44)
AST: 36 U/L (ref 15–41)
Albumin: 4.2 g/dL (ref 3.5–5.0)
Alkaline Phosphatase: 65 U/L (ref 38–126)
Anion gap: 8 (ref 5–15)
BUN: 10 mg/dL (ref 6–20)
CO2: 28 mmol/L (ref 22–32)
Calcium: 9.5 mg/dL (ref 8.9–10.3)
Chloride: 102 mmol/L (ref 98–111)
Creatinine, Ser: 0.72 mg/dL (ref 0.44–1.00)
GFR, Estimated: >60 mL/min (ref >60)
Glucose, Bld: 121 mg/dL — ABNORMAL HIGH (ref 70–99)
Potassium: 3.6 mmol/L (ref 3.5–5.1)
Sodium: 138 mmol/L (ref 135–145)
Total Bilirubin: 0.4 mg/dL (ref <1.2)
Total Protein: 7.6 g/dL (ref 6.5–8.1)

## 2022-12-11 LAB — ETHANOL: Alcohol, Ethyl (B): <10 mg/dL (ref <10)

## 2022-12-11 LAB — TROPONIN I (HIGH SENSITIVITY)
Troponin I (High Sensitivity): 6 ng/L (ref <18)
Troponin I (High Sensitivity): 6 ng/L (ref <18)

## 2022-12-11 MED ORDER — ACETAMINOPHEN 500 MG PO TABS
1000.0000 mg | ORAL_TABLET | Freq: Once | ORAL | Status: AC
  Administered 2022-12-11: 1000 mg via ORAL
  Filled 2022-12-11: qty 2

## 2022-12-11 MED ORDER — ROSUVASTATIN CALCIUM 20 MG PO TABS
40.0000 mg | ORAL_TABLET | Freq: Every day | ORAL | Status: DC
  Administered 2022-12-12: 40 mg via ORAL
  Filled 2022-12-11: qty 2

## 2022-12-11 MED ORDER — POLYETHYLENE GLYCOL 3350 17 G PO PACK: 17.0000 g | PACK | Freq: Every day | ORAL | Status: DC | PRN

## 2022-12-11 MED ORDER — STROKE: EARLY STAGES OF RECOVERY BOOK
Freq: Once | Status: AC
Start: 2022-12-12 — End: 2022-12-12
  Filled 2022-12-11: qty 1

## 2022-12-11 MED ORDER — DIPHENHYDRAMINE HCL 50 MG/ML IJ SOLN
50.0000 mg | Freq: Once | INTRAMUSCULAR | Status: DC
Start: 2022-12-11 — End: 2022-12-11

## 2022-12-11 MED ORDER — ACETAMINOPHEN 325 MG PO TABS: 650.0000 mg | ORAL_TABLET | Freq: Four times a day (QID) | ORAL | Status: DC | PRN

## 2022-12-11 MED ORDER — ASPIRIN 81 MG PO TBEC
81.0000 mg | DELAYED_RELEASE_TABLET | Freq: Every day | ORAL | Status: DC
  Administered 2022-12-12 (×2): 81 mg via ORAL
  Filled 2022-12-11 (×2): qty 1

## 2022-12-11 MED ORDER — PANTOPRAZOLE SODIUM 40 MG PO TBEC
40.0000 mg | DELAYED_RELEASE_TABLET | Freq: Every day | ORAL | Status: DC
  Administered 2022-12-12: 40 mg via ORAL
  Filled 2022-12-11: qty 1

## 2022-12-11 MED ORDER — ENOXAPARIN SODIUM 40 MG/0.4ML IJ SOSY: 40.0000 mg | PREFILLED_SYRINGE | INTRAMUSCULAR | Status: DC

## 2022-12-11 MED ORDER — DIPHENHYDRAMINE HCL 25 MG PO CAPS: 50.0000 mg | ORAL_CAPSULE | Freq: Once | ORAL | Status: DC

## 2022-12-11 MED ORDER — PROCHLORPERAZINE EDISYLATE 10 MG/2ML IJ SOLN: 5.0000 mg | Freq: Four times a day (QID) | INTRAMUSCULAR | Status: DC | PRN

## 2022-12-11 MED ORDER — METHYLPREDNISOLONE SODIUM SUCC 40 MG IJ SOLR
40.0000 mg | Freq: Once | INTRAMUSCULAR | Status: AC
  Administered 2022-12-11: 40 mg via INTRAVENOUS
  Filled 2022-12-11: qty 1

## 2022-12-11 MED ORDER — METOPROLOL SUCCINATE ER 25 MG PO TB24
12.5000 mg | ORAL_TABLET | Freq: Every day | ORAL | Status: DC
  Administered 2022-12-12: 12.5 mg via ORAL
  Filled 2022-12-11: qty 1

## 2022-12-11 MED ORDER — ALPRAZOLAM 0.25 MG PO TABS
0.2500 mg | ORAL_TABLET | Freq: Every day | ORAL | Status: DC | PRN
  Administered 2022-12-12: 0.25 mg via ORAL
  Filled 2022-12-11: qty 1

## 2022-12-11 MED ORDER — CLOPIDOGREL BISULFATE 75 MG PO TABS
75.0000 mg | ORAL_TABLET | Freq: Every day | ORAL | Status: DC
  Administered 2022-12-12 (×2): 75 mg via ORAL
  Filled 2022-12-11 (×2): qty 1

## 2022-12-11 MED ORDER — MELATONIN 5 MG PO TABS: 5.0000 mg | ORAL_TABLET | Freq: Every evening | ORAL | Status: DC | PRN

## 2022-12-11 NOTE — ED Notes (Signed)
Mary Dominguez, [phone] (son and daughter in law)

## 2022-12-11 NOTE — ED Triage Notes (Signed)
Pt c/o dizziness, chest pain, and shaking all over all day.

## 2022-12-11 NOTE — Telephone Encounter (Signed)
Copied from CRM [id number]. Topic: Clinical - Medical Advice >> Dec 11, 2022  8:06 AM Almira Coaster wrote: Reason for CRM: Patient states that her blood pressure has been fluctuating and she would like to be seen in the office tomorrow Dec 05, I advised that we had an opening today at 12pm and after that the next date is Dec 12. I scheduled patient for Dec 12 but she would like to be seen tomorrow if there is any cancellations or possibility of an overbook.

## 2022-12-11 NOTE — ED Provider Notes (Signed)
EMERGENCY DEPARTMENT AT [facility] Provider Note   CSN: [id number] Arrival date & time: 12/11/22  1911     History  Chief Complaint  Patient presents with   Dizziness    Mary Dominguez is a 57 y.o. female, history of tobacco abuse, anxiety, who presents to the ED secondary to intermittent palpitations that have been going on for the last 3 weeks, with associated difficulty with speech, slurring of speech, and right-sided facial numbness, that happened earlier today.  She states that is now resolved, but that it lasted for period time from 5:15 to a little bit after 6:00.  She denies any current difficulty with speech, but does endorse a severe headache, off all over her head.  Is requesting Tylenol.  Also reports intermittent chest pain, with the palpitations with tightness.  Denies any shortness of breath.     Home Medications Prior to Admission medications   Medication Sig Start Date End Date Taking? Authorizing Provider  ALPRAZolam Prudy Feeler) 0.5 MG tablet Take 1 tablet (0.5 mg total) by mouth daily as needed. 10/24/22   Donita Brooks, MD  metoprolol succinate (TOPROL-XL) 25 MG 24 hr tablet Take 1 tablet (25 mg total) by mouth daily. 10/21/22   Donita Brooks, MD  pantoprazole (PROTONIX) 40 MG tablet Take 1 tablet (40 mg total) by mouth daily. 11/05/22   Donita Brooks, MD  rosuvastatin (CRESTOR) 40 MG tablet Take 1 tablet (40 mg total) by mouth daily. 10/22/22   Donita Brooks, MD      Allergies    Penicillins, Percocet [oxycodone-acetaminophen], Cyclobenzaprine, Doxycycline, Ivp dye [iodinated contrast media], and Levaquin [levofloxacin in d5w]    Review of Systems   Review of Systems  Respiratory:  Negative for shortness of breath.   Cardiovascular:  Positive for chest pain.  Neurological:  Positive for dizziness.    Physical Exam Updated Vital Signs BP (!) 156/82   Pulse 77   Temp 98.4 F (36.9 C)   Resp 14   Ht 5\' 7"  (1.702 m)    Wt 66.9 kg   SpO2 94%   BMI 23.10 kg/m  Physical Exam Vitals and nursing note reviewed.  Constitutional:      General: She is not in acute distress.    Appearance: She is well-developed.  HENT:     Head: Normocephalic and atraumatic.  Eyes:     Conjunctiva/sclera: Conjunctivae normal.  Cardiovascular:     Rate and Rhythm: Normal rate and regular rhythm.     Heart sounds: No murmur heard. Pulmonary:     Effort: Pulmonary effort is normal. No respiratory distress.     Breath sounds: Normal breath sounds.  Abdominal:     Palpations: Abdomen is soft.     Tenderness: There is no abdominal tenderness.  Musculoskeletal:        General: No swelling.     Cervical back: Neck supple.  Skin:    General: Skin is warm and dry.     Capillary Refill: Capillary refill takes less than 2 seconds.  Neurological:     General: No focal deficit present.     Mental Status: She is alert and oriented to person, place, and time. Mental status is at baseline.     Cranial Nerves: No cranial nerve deficit.     Sensory: No sensory deficit.     Motor: No weakness.     Coordination: Coordination normal.  Psychiatric:        Mood and  Affect: Mood normal.     ED Results / Procedures / Treatments   Labs (all labs ordered are listed, but only abnormal results are displayed) Labs Reviewed  CBC WITH DIFFERENTIAL/PLATELET - Abnormal; Notable for the following components:      Result Value   WBC 12.2 (*)    Hemoglobin 16.3 (*)    HCT 47.9 (*)    Neutro Abs 8.3 (*)    All other components within normal limits  COMPREHENSIVE METABOLIC PANEL - Abnormal; Notable for the following components:   Glucose, Bld 121 (*)    ALT 49 (*)    All other components within normal limits  URINALYSIS, ROUTINE W REFLEX MICROSCOPIC - Abnormal; Notable for the following components:   Color, Urine STRAW (*)    Specific Gravity, Urine 1.004 (*)    Hgb urine dipstick Jaliel Deavers (*)    Bacteria, UA RARE (*)    All other  components within normal limits  ETHANOL  LIPID PANEL  HEMOGLOBIN A1C  HEMOGLOBIN A1C  HIV ANTIBODY (ROUTINE TESTING W REFLEX)  CBC  COMPREHENSIVE METABOLIC PANEL  MAGNESIUM  PHOSPHORUS  TROPONIN I (HIGH SENSITIVITY)  TROPONIN I (HIGH SENSITIVITY)    EKG EKG Interpretation Date/Time:  Wednesday December 11 2022 19:24:59 EST Ventricular Rate:  76 PR Interval:  146 QRS Duration:  112 QT Interval:  462 QTC Calculation: 520 R Axis:   -23  Text Interpretation: Sinus rhythm Borderline intraventricular conduction delay Low voltage, precordial leads RSR' in V1 or V2, right VCD or RVH Borderline T abnormalities, anterior leads Prolonged QT interval , increased since last tracing Confirmed by Linwood Dibbles [phone]) on 12/11/2022 7:29:19 PM  Radiology DG Chest 2 View  Result Date: 12/11/2022 CLINICAL DATA:  Chest pain. EXAM: CHEST - 2 VIEW COMPARISON:  September 29, 2017 FINDINGS: The heart size and mediastinal contours are within normal limits. There is moderate severity calcification of the aortic arch. Both lungs are clear. The visualized skeletal structures are unremarkable. IMPRESSION: No active cardiopulmonary disease. Electronically Signed   By: Aram Candela M.D.   On: 12/11/2022 22:46   CT Head Wo Contrast  Result Date: 12/11/2022 CLINICAL DATA:  Transient ischemic attack EXAM: CT HEAD WITHOUT CONTRAST TECHNIQUE: Contiguous axial images were obtained from the base of the skull through the vertex without intravenous contrast. RADIATION DOSE REDUCTION: This exam was performed according to the departmental dose-optimization program which includes automated exposure control, adjustment of the mA and/or kV according to patient size and/or use of iterative reconstruction technique. COMPARISON:  03/28/2010 FINDINGS: Brain: No mass,hemorrhage or extra-axial collection. Normal appearance of the parenchyma and CSF spaces. Vascular: No hyperdense vessel or unexpected vascular calcification. Skull:  The visualized skull base, calvarium and extracranial soft tissues are normal. Sinuses/Orbits: No fluid levels or advanced mucosal thickening of the visualized paranasal sinuses. No mastoid or middle ear effusion. Normal orbits. IMPRESSION: Normal head CT. Electronically Signed   By: Deatra Robinson M.D.   On: 12/11/2022 22:18   MR BRAIN WO CONTRAST  Result Date: 12/11/2022 CLINICAL DATA:  Transient ischemic attack EXAM: MRI HEAD WITHOUT CONTRAST TECHNIQUE: Multiplanar, multiecho pulse sequences of the brain and surrounding structures were obtained without intravenous contrast. COMPARISON:  04/25/2010 FINDINGS: Brain: No acute infarct, mass effect or extra-axial collection. No chronic microhemorrhage or siderosis. Normal white matter signal, parenchymal volume and CSF spaces. The midline structures are normal. Vascular: Normal flow voids. Skull and upper cervical spine: Normal marrow signal. Sinuses/Orbits: Negative. Other: None. IMPRESSION: Normal brain MRI. Electronically Signed  By: Deatra Robinson M.D.   On: 12/11/2022 22:16    Procedures Procedures    Medications Ordered in ED Medications  rosuvastatin (CRESTOR) tablet 40 mg (has no administration in time range)  pantoprazole (PROTONIX) EC tablet 40 mg (has no administration in time range)  metoprolol succinate (TOPROL-XL) 24 hr tablet 12.5 mg (has no administration in time range)  ALPRAZolam (XANAX) tablet 0.25 mg (has no administration in time range)  enoxaparin (LOVENOX) injection 40 mg (has no administration in time range)   stroke: early stages of recovery book (has no administration in time range)  aspirin EC tablet 81 mg (has no administration in time range)  clopidogrel (PLAVIX) tablet 75 mg (has no administration in time range)  acetaminophen (TYLENOL) tablet 650 mg (has no administration in time range)  prochlorperazine (COMPAZINE) injection 5 mg (has no administration in time range)  polyethylene glycol (MIRALAX / GLYCOLAX) packet  17 g (has no administration in time range)  melatonin tablet 5 mg (has no administration in time range)  methylPREDNISolone sodium succinate (SOLU-MEDROL) 40 mg/mL injection 40 mg (40 mg Intravenous Given 12/11/22 2052)  acetaminophen (TYLENOL) tablet 1,000 mg (1,000 mg Oral Given 12/11/22 2050)    ED Course/ Medical Decision Making/ A&P                                 Medical Decision Making Patient is 57 year old female, here for palpitations, that been going on for the last 3 weeks, now with difficulty with speech, slurring speech, right-sided facial numbness that started around 515, and last until little bit after 6:00.  She has no acute neurodeficits on my exam, but is agitated.  We will obtain a CT head, MRI, for further evaluation.  She is allergic to IV contrast, thus we will hold off on a CTA head/neck, currently, as she has no acute neurodeficits.  Will also obtain troponins, and EKG for further evaluation  Amount and/or Complexity of Data Reviewed Labs: ordered.    Details: Unremarkable Radiology: ordered.    Details: CT head, and MRI within normal limits Discussion of management or test interpretation with external provider(s): Discussed with family at bedside, they are concerned that she may be drinking alcohol, ethanol ordered.  Her MRI and CT are reassuring however given the palpitations that have been going on for the last 3 weeks, as well as the TIA-like's episode, I believe that she warrants admission for echocardiogram, further vascular ultrasound, to rule out kind of any kind of stenosis/lesions of the carotids.  Admitted to Dr. Margo Aye for for further management  Risk OTC drugs. Prescription drug management. Decision regarding hospitalization.    Final Clinical Impression(s) / ED Diagnoses Final diagnoses:  TIA (transient ischemic attack)  Palpitations    Rx / DC Orders ED Discharge Orders     None         Antione Obar, Harley Alto, PA 12/11/22 2313    Linwood Dibbles,  MD 12/12/22 [phone]

## 2022-12-12 ENCOUNTER — Observation Stay (HOSPITAL_COMMUNITY): Payer: BC Managed Care – PPO

## 2022-12-12 ENCOUNTER — Ambulatory Visit: Payer: Self-pay

## 2022-12-12 DIAGNOSIS — I6523 Occlusion and stenosis of bilateral carotid arteries: Secondary | ICD-10-CM | POA: Diagnosis not present

## 2022-12-12 DIAGNOSIS — G459 Transient cerebral ischemic attack, unspecified: Secondary | ICD-10-CM

## 2022-12-12 LAB — HEMOGLOBIN A1C
Hgb A1c MFr Bld: 5.7 % — ABNORMAL HIGH (ref 4.8–5.6)
Mean Plasma Glucose: 116.89 mg/dL

## 2022-12-12 LAB — COMPREHENSIVE METABOLIC PANEL
ALT: 48 U/L — ABNORMAL HIGH (ref 0–44)
AST: 31 U/L (ref 15–41)
Albumin: 4.1 g/dL (ref 3.5–5.0)
Alkaline Phosphatase: 60 U/L (ref 38–126)
Anion gap: 10 (ref 5–15)
BUN: 10 mg/dL (ref 6–20)
CO2: 24 mmol/L (ref 22–32)
Calcium: 9.5 mg/dL (ref 8.9–10.3)
Chloride: 103 mmol/L (ref 98–111)
Creatinine, Ser: 0.57 mg/dL (ref 0.44–1.00)
GFR, Estimated: >60 mL/min (ref >60)
Glucose, Bld: 155 mg/dL — ABNORMAL HIGH (ref 70–99)
Potassium: 3.8 mmol/L (ref 3.5–5.1)
Sodium: 137 mmol/L (ref 135–145)
Total Bilirubin: 0.4 mg/dL (ref <1.2)
Total Protein: 7.5 g/dL (ref 6.5–8.1)

## 2022-12-12 LAB — CBC
HCT: 49.5 % — ABNORMAL HIGH (ref 36.0–46.0)
Hemoglobin: 16.8 g/dL — ABNORMAL HIGH (ref 12.0–15.0)
MCH: 32.9 pg (ref 26.0–34.0)
MCHC: 33.9 g/dL (ref 30.0–36.0)
MCV: 96.9 fL (ref 80.0–100.0)
Platelets: 241 10*3/uL (ref 150–400)
RBC: 5.11 MIL/uL (ref 3.87–5.11)
RDW: 11.9 % (ref 11.5–15.5)
WBC: 8.5 10*3/uL (ref 4.0–10.5)
nRBC: 0 % (ref 0.0–0.2)

## 2022-12-12 LAB — ECHOCARDIOGRAM COMPLETE
Area-P 1/2: 3.94 cm2
Calc EF: 67.1 %
Height: 67 in
S' Lateral: 3.1 cm
Single Plane A2C EF: 66.8 %
Single Plane A4C EF: 70.3 %
Weight: 2317.48 [oz_av]

## 2022-12-12 LAB — MAGNESIUM: Magnesium: 2.3 mg/dL (ref 1.7–2.4)

## 2022-12-12 LAB — LIPID PANEL
Cholesterol: 199 mg/dL (ref 0–200)
HDL: 61 mg/dL (ref >40)
LDL Cholesterol: 122 mg/dL — ABNORMAL HIGH (ref 0–99)
Total CHOL/HDL Ratio: 3.3 {ratio}
Triglycerides: 80 mg/dL (ref <150)
VLDL: 16 mg/dL (ref 0–40)

## 2022-12-12 LAB — TSH: TSH: 0.526 u[IU]/mL (ref 0.350–4.500)

## 2022-12-12 LAB — HIV ANTIBODY (ROUTINE TESTING W REFLEX): HIV Screen 4th Generation wRfx: NONREACTIVE

## 2022-12-12 LAB — VITAMIN B12: Vitamin B-12: 275 pg/mL (ref 180–914)

## 2022-12-12 LAB — PHOSPHORUS: Phosphorus: 2.8 mg/dL (ref 2.5–4.6)

## 2022-12-12 MED ORDER — SODIUM CHLORIDE 0.9 % IV SOLN: INTRAVENOUS | Status: DC

## 2022-12-12 MED ORDER — ASPIRIN 81 MG PO TBEC: 81.0000 mg | DELAYED_RELEASE_TABLET | Freq: Every day | ORAL | 12 refills | Status: DC

## 2022-12-12 MED ORDER — AMLODIPINE BESYLATE 5 MG PO TABS: 5.0000 mg | ORAL_TABLET | Freq: Every day | ORAL | 1 refills | Status: DC

## 2022-12-12 MED ORDER — MELATONIN 5 MG PO TABS: 5.0000 mg | ORAL_TABLET | Freq: Every evening | ORAL | 0 refills | Status: AC | PRN

## 2022-12-12 MED ORDER — NICOTINE 14 MG/24HR TD PT24
14.0000 mg | MEDICATED_PATCH | Freq: Every day | TRANSDERMAL | Status: DC
  Administered 2022-12-12: 14 mg via TRANSDERMAL
  Filled 2022-12-12: qty 1

## 2022-12-12 MED ORDER — ALPRAZOLAM 0.5 MG PO TABS: 0.5000 mg | ORAL_TABLET | Freq: Every day | ORAL | 0 refills | Status: DC | PRN

## 2022-12-12 MED ORDER — ORAL CARE MOUTH RINSE: 15.0000 mL | OROMUCOSAL | Status: DC | PRN

## 2022-12-12 NOTE — Progress Notes (Signed)
  Echocardiogram 2D Echocardiogram has been performed.  Mary Dominguez 12/12/2022, 2:15 PM

## 2022-12-12 NOTE — Progress Notes (Signed)
   12/12/22 1049  TOC Brief Assessment  Insurance and Status Reviewed  Patient has primary care physician Yes  Home environment has been reviewed from home  Prior level of function: independent  Prior/Current Home Services No current home services  Social Determinants of Health Reivew SDOH reviewed no interventions necessary  Readmission risk has been reviewed Yes  Transition of care needs no transition of care needs at this time    Transition of Care Department [facility]) has reviewed patient and no TOC needs have been identified at this time. We will continue to monitor patient advancement through interdisciplinary progression rounds. If new patient transition needs arise, please place a TOC consult.

## 2022-12-12 NOTE — H&P (Incomplete)
History and Physical  Mary Dominguez YQM:[id number] DOB: 1965-03-18 DOA: 12/11/2022  Referring physician: Barnie Alderman, PA-EDP  PCP: Donita Brooks, MD  Outpatient Specialists: None. Patient coming from: Home.  Chief Complaint: Difficulty with speech.  HPI: Mary Dominguez is a 57 y.o. female with medical history significant for chronic anxiety, hypertension, hyperlipidemia, tobacco use, alcohol use, who presents to the ED with multiple complaints which include dizziness, chest pain, intermittent palpitations, tremors, difficulty with speech, slurring of speech and right-sided facial numbness that happened after 6 PM today.  It lasted a few minutes.  Associated with a severe headache and elevated blood pressures in the 180s to 190s systolically at home (which she documented on a piece of paper).  She presented to the ED for further evaluation.  In the ED, her symptoms had already resolved.  She is hypertensive.  Noncontrast head CT and brain MRI were nonacute.  Due to concern for TIA, EDP requested admission for TIA workup.  Admitted by [facility], hospitalist service.  ED Course: Temperature 98.2.  BP 152/76, pulse 66, respiratory 16, saturation 99% on room air.  Lab studies notable for elevated glucose 121, elevated ALT 49.  WBC 12.2, hemoglobin 16.3, neutrophil count 8.3.  Review of Systems: Review of systems as noted in the HPI. All other systems reviewed and are negative.   Past Medical History:  Diagnosis Date   Adnexal mass    LEFT   Bronchitis    09-27-17, TOOK ANTIBIOTICS AND PREDNOSONE NOW RESOLVED   Bronchitis    Cancer (HCC)    left forearm skin CA 2022   Former smoker    GERD (gastroesophageal reflux disease)    Hernia, umbilical    Insomnia    Past Surgical History:  Procedure Laterality Date   APPENDECTOMY  AGE 20   COLONOSCOPY WITH PROPOFOL N/A 09/30/2017   Procedure: COLONOSCOPY WITH PROPOFOL;  Surgeon: West Bali, MD;  Location: [facility] SUITE;  Service:  Endoscopy;  Laterality: N/A;  2:30pm   OOPHORECTOMY     USO 2002   ROBOTIC ASSISTED SALPINGO OOPHERECTOMY Left 10/08/2017   Procedure: XI ROBOTIC ASSISTED UNILATERAL SALPINGO OOPHORECTOMY;  Surgeon: Shonna Chock, MD;  Location: [facility] ORS;  Service: Gynecology;  Laterality: Left;   VAGINAL HYSTERECTOMY     dysplasia and AUB    Social History:  reports that she has been smoking cigarettes. She has a 25.5 pack-year smoking history. She has never used smokeless tobacco. She reports current alcohol use. She reports that she does not use drugs.   Allergies  Allergen Reactions   Penicillins Shortness Of Breath    Childhood reaction. NOTES that she is OK with cephalosporins Childhood reaction. NOTES that she is OK with cephalosporins Childhood reaction. NOTES that she is OK with cephalosporins   Percocet [Oxycodone-Acetaminophen] Nausea And Vomiting   Cyclobenzaprine Other (See Comments)    Lip tingling Other reaction(s): Other Lip tingling Lip tingling   Doxycycline Rash   Ivp Dye [Iodinated Contrast Media] Itching and Other (See Comments)    Itching, sneezing, will need premeds in future.   Levaquin [Levofloxacin In D5w] Rash    Family History  Problem Relation Age of Onset   Cancer Neg Hx        no breast, ovarian, or colon cancer   Colon polyps Neg Hx       Prior to Admission medications   Medication Sig Start Date End Date Taking? Authorizing Provider  ALPRAZolam Prudy Feeler) 0.5 MG tablet Take 1 tablet (  0.5 mg total) by mouth daily as needed. 10/24/22   Donita Brooks, MD  metoprolol succinate (TOPROL-XL) 25 MG 24 hr tablet Take 1 tablet (25 mg total) by mouth daily. 10/21/22   Donita Brooks, MD  pantoprazole (PROTONIX) 40 MG tablet Take 1 tablet (40 mg total) by mouth daily. 11/05/22   Donita Brooks, MD  rosuvastatin (CRESTOR) 40 MG tablet Take 1 tablet (40 mg total) by mouth daily. 10/22/22   Donita Brooks, MD    Physical Exam: BP (!) 153/76 (BP Location: Left  Arm)   Pulse 66   Temp 98.2 F (36.8 C) (Oral)   Resp 18   Ht 5\' 7"  (1.702 m)   Wt 65.7 kg   SpO2 99%   BMI 22.69 kg/m   General: 57 y.o. year-old female well developed well nourished in no acute distress.  Alert and oriented x3. Cardiovascular: Regular rate and rhythm with no rubs or gallops.  No thyromegaly or JVD noted.  No lower extremity edema. 2/4 pulses in all 4 extremities. Respiratory: Clear to auscultation with no wheezes or rales. Good inspiratory effort. Abdomen: Soft nontender nondistended with normal bowel sounds x4 quadrants. Muskuloskeletal: No cyanosis, clubbing or edema noted bilaterally Neuro: CN II-XII intact, strength, sensation, reflexes Skin: No ulcerative lesions noted or rashes Psychiatry: Judgement and insight appear normal. Mood is appropriate for condition and setting          Labs on Admission:  Basic Metabolic Panel: Recent Labs  Lab 12/11/22 2019  NA 138  K 3.6  CL 102  CO2 28  GLUCOSE 121*  BUN 10  CREATININE 0.72  CALCIUM 9.5   Liver Function Tests: Recent Labs  Lab 12/11/22 2019  AST 36  ALT 49*  ALKPHOS 65  BILITOT 0.4  PROT 7.6  ALBUMIN 4.2   No results for input(s): "LIPASE", "AMYLASE" in the last 168 hours. No results for input(s): "AMMONIA" in the last 168 hours. CBC: Recent Labs  Lab 12/11/22 2019  WBC 12.2*  NEUTROABS 8.3*  HGB 16.3*  HCT 47.9*  MCV 96.6  PLT 234   Cardiac Enzymes: No results for input(s): "CKTOTAL", "CKMB", "CKMBINDEX", "TROPONINI" in the last 168 hours.  BNP (last 3 results) No results for input(s): "BNP" in the last 8760 hours.  ProBNP (last 3 results) No results for input(s): "PROBNP" in the last 8760 hours.  CBG: No results for input(s): "GLUCAP" in the last 168 hours.  Radiological Exams on Admission: DG Chest 2 View  Result Date: 12/11/2022 CLINICAL DATA:  Chest pain. EXAM: CHEST - 2 VIEW COMPARISON:  September 29, 2017 FINDINGS: The heart size and mediastinal contours are  within normal limits. There is moderate severity calcification of the aortic arch. Both lungs are clear. The visualized skeletal structures are unremarkable. IMPRESSION: No active cardiopulmonary disease. Electronically Signed   By: Aram Candela M.D.   On: 12/11/2022 22:46   CT Head Wo Contrast  Result Date: 12/11/2022 CLINICAL DATA:  Transient ischemic attack EXAM: CT HEAD WITHOUT CONTRAST TECHNIQUE: Contiguous axial images were obtained from the base of the skull through the vertex without intravenous contrast. RADIATION DOSE REDUCTION: This exam was performed according to the departmental dose-optimization program which includes automated exposure control, adjustment of the mA and/or kV according to patient size and/or use of iterative reconstruction technique. COMPARISON:  03/28/2010 FINDINGS: Brain: No mass,hemorrhage or extra-axial collection. Normal appearance of the parenchyma and CSF spaces. Vascular: No hyperdense vessel or unexpected vascular calcification. Skull: The visualized  skull base, calvarium and extracranial soft tissues are normal. Sinuses/Orbits: No fluid levels or advanced mucosal thickening of the visualized paranasal sinuses. No mastoid or middle ear effusion. Normal orbits. IMPRESSION: Normal head CT. Electronically Signed   By: Deatra Robinson M.D.   On: 12/11/2022 22:18   MR BRAIN WO CONTRAST  Result Date: 12/11/2022 CLINICAL DATA:  Transient ischemic attack EXAM: MRI HEAD WITHOUT CONTRAST TECHNIQUE: Multiplanar, multiecho pulse sequences of the brain and surrounding structures were obtained without intravenous contrast. COMPARISON:  04/25/2010 FINDINGS: Brain: No acute infarct, mass effect or extra-axial collection. No chronic microhemorrhage or siderosis. Normal white matter signal, parenchymal volume and CSF spaces. The midline structures are normal. Vascular: Normal flow voids. Skull and upper cervical spine: Normal marrow signal. Sinuses/Orbits: Negative. Other: None.  IMPRESSION: Normal brain MRI. Electronically Signed   By: Deatra Robinson M.D.   On: 12/11/2022 22:16    EKG: I independently viewed the EKG done and my findings are as followed: Sinus rhythm rate of 76.  Nonspecific ST-T changes.  QTc 520.  Assessment/Plan Present on Admission:  TIA (transient ischemic attack)  Principal Problem:   TIA (transient ischemic attack)  TIA Dysarthria, facial numbness Last known well around 6 PM Symptoms have resolved Head CT and brain MRI nonacute. Fasting lipid panel, A1c CT angio head and neck 2D echo Telemetry monitoring and neurochecks Aspirin and Plavix 21 days, then antiplatelet monotherapy alone. (Was not on aspirin prior to admission). Home Crestor 40 mg daily Consider neurology consultation in the morning  Frequent palpitations Onset 3 weeks ago frequently, nearing daily Monitor on telemetry Obtain TSH Follow 2D echo Consider cardiology evaluation for possible Holter monitor or ambulatory telemetry  Hypertension BPs are not at goal, elevated. Resume home Toprol-XL Closely monitor vital signs  Hyperlipidemia Resume home Crestor Follow fasting lipid panel Goal LDL less than 70.  Alcohol use Denies any recent use Denies excessive use of alcohol States she uses alcohol about weekly  Tobacco use Nicotine patch as needed  QTc prolongation Admission twelve-lead EKG showed QTc 520 Avoid QTc prolonging agents Optimize magnesium and potassium levels Monitor on telemetry.   Time: 75 minutes.   DVT prophylaxis: Subcu Lovenox daily  Code Status: Full code  Family Communication: None at bedside.  Disposition Plan: Admitted to telemetry unit  Consults called: None.  Admission status: Observation status.   Status is: Observation    Darlin Drop MD [facility] Pager [phone]  If 7PM-7AM, please contact night-coverage [email or web address] Password [email or web address]  12/12/2022, 12:03 AM

## 2022-12-12 NOTE — Plan of Care (Signed)

## 2022-12-12 NOTE — Progress Notes (Signed)
SLP Cancellation Note  Patient Details Name: Mary Dominguez MRN: [id number] DOB: August 26, 1965   Cancelled treatment:       Reason Eval/Treat Not Completed: SLP screened, no needs identified, will sign off; Per chart review from other disciplines, Pt has returned to baseline and MRI negative for acute changes. Pt discharging today. SLE will be deferred at this time. Reconsult if indicated. SLP will sign off.   Thank you,  Havery Moros, CCC-SLP [phone]  Katisha Shimizu 12/12/2022, 1:26 PM

## 2022-12-12 NOTE — Evaluation (Signed)
Physical Therapy Evaluation Patient Details Name: Mary Dominguez MRN: [id number] DOB: 1965/07/23 Today's Date: 12/12/2022  History of Present Illness  Mary Dominguez is a 57 y.o. female with medical history significant for chronic anxiety, hypertension, hyperlipidemia, tobacco use, alcohol use, who presents to the ED with multiple complaints which include dizziness, chest pain, intermittent palpitations, tremors, difficulty with speech, slurring of speech and right-sided facial numbness that happened after 6 PM today.  It lasted a few minutes.  Associated with a severe headache and elevated blood pressures in the 180s to 190s systolically at home (which she documented on a piece of paper).  She presented to the ED for further evaluation.   Clinical Impression  Patient functioning near baseline for functional mobility and gait demonstrating good return for bed mobility, transfers and ambulating in room/hallway without loss of balance.  Plan:  Patient discharged from physical therapy to care of nursing for ambulation daily as tolerated for length of stay.          If plan is discharge home, recommend the following: Help with stairs or ramp for entrance   Can travel by private vehicle        Equipment Recommendations None recommended by PT  Recommendations for Other Services       Functional Status Assessment Patient has had a recent decline in their functional status and/or demonstrates limited ability to make significant improvements in function in a reasonable and predictable amount of time     Precautions / Restrictions Precautions Precautions: None Restrictions Weight Bearing Restrictions: No      Mobility  Bed Mobility Overal bed mobility: Independent                  Transfers Overall transfer level: Independent                      Ambulation/Gait Ambulation/Gait assistance: Modified independent (Device/Increase time) Gait Distance (Feet): 150  Feet Assistive device: None Gait Pattern/deviations: WFL(Within Functional Limits) Gait velocity: slightly decreased     General Gait Details: grossly WFL with good return for ambuating in room, hallway without loss of balance  Stairs            Wheelchair Mobility     Tilt Bed    Modified Rankin (Stroke Patients Only)       Balance Overall balance assessment: Mild deficits observed, not formally tested                                           Pertinent Vitals/Pain Pain Assessment Faces Pain Scale: Hurts a little bit Pain Location: chest pain Pain Descriptors / Indicators: Discomfort Pain Intervention(s): Limited activity within patient's tolerance, Monitored during session    Home Living Family/patient expects to be discharged to:: Private residence Living Arrangements: Spouse/significant other Available Help at Discharge: Family;Available 24 hours/day Type of Home: House Home Access: Stairs to enter Entrance Stairs-Rails: None Entrance Stairs-Number of Steps: 1   Home Layout: Laundry or work area in basement;One level;Able to live on main level with bedroom/bathroom Home Equipment: Grab bars - tub/shower      Prior Function Prior Level of Function : Independent/Modified Independent             Mobility Comments: Tourist information centre manager, drives, shops ADLs Comments: Independent     Extremity/Trunk Assessment   Upper Extremity Assessment Upper Extremity Assessment:  Defer to OT evaluation    Lower Extremity Assessment Lower Extremity Assessment: Overall WFL for tasks assessed    Cervical / Trunk Assessment Cervical / Trunk Assessment: Normal  Communication   Communication Communication: No apparent difficulties  Cognition Arousal: Alert Behavior During Therapy: WFL for tasks assessed/performed Overall Cognitive Status: Within Functional Limits for tasks assessed                                           General Comments      Exercises     Assessment/Plan    PT Assessment Patient does not need any further PT services  PT Problem List         PT Treatment Interventions      PT Goals (Current goals can be found in the Care Plan section)  Acute Rehab PT Goals Patient Stated Goal: return home with family to assist PT Goal Formulation: With patient/family Time For Goal Achievement: 12/12/22 Potential to Achieve Goals: Good    Frequency       Co-evaluation PT/OT/SLP Co-Evaluation/Treatment: Yes Reason for Co-Treatment: To address functional/ADL transfers PT goals addressed during session: Mobility/safety with mobility;Balance         AM-PAC PT "6 Clicks" Mobility  Outcome Measure Help needed turning from your back to your side while in a flat bed without using bedrails?: None Help needed moving from lying on your back to sitting on the side of a flat bed without using bedrails?: None Help needed moving to and from a bed to a chair (including a wheelchair)?: None Help needed standing up from a chair using your arms (e.g., wheelchair or bedside chair)?: None Help needed to walk in hospital room?: None Help needed climbing 3-5 steps with a railing? : None 6 Click Score: 24    End of Session   Activity Tolerance: Patient tolerated treatment well Patient left: in bed;with call bell/phone within reach;with family/visitor present Nurse Communication: Mobility status PT Visit Diagnosis: Unsteadiness on feet (R26.81);Other abnormalities of gait and mobility (R26.89);Muscle weakness (generalized) (M62.81)    Time: 7253-6644 PT Time Calculation (min) (ACUTE ONLY): 20 min   Charges:   PT Evaluation $PT Eval Moderate Complexity: 1 Mod PT Treatments $Therapeutic Activity: 8-22 mins PT General Charges $$ ACUTE PT VISIT: 1 Visit         12:15 PM, 12/12/22 Ocie Bob, MPT Physical Therapist with [facility] [phone] office [phone] mobile  phone

## 2022-12-12 NOTE — Discharge Summary (Addendum)
Physician Discharge Summary   Patient: Mary Dominguez MRN: [id number] DOB: 1965/09/17  Admit date:     12/11/2022  Discharge date: 12/12/22  Discharge Physician: Kendell Bane   PCP: Donita Brooks, MD   Recommendations at discharge:  Follow with the PCP in 1-2 weeks Avoid all stimulants including caffeine, caffeine products, alcohol, tobacco  Discharge Diagnoses: Principal Problem:   TIA (transient ischemic attack)  Resolved Problems:   * No resolved hospital problems. *  Hospital Course: LELSIE Dominguez is a 57 y.o. female with medical history significant for chronic anxiety, hypertension, hyperlipidemia, tobacco use, alcohol use, who presents to the ED with multiple complaints which include dizziness, chest pain, intermittent palpitations, tremors, difficulty with speech, slurring of speech and right-sided facial numbness that happened after 6 PM today.  It lasted a few minutes.  Associated with a severe headache and elevated blood pressures in the 180s to 190s systolically at home (which she documented on a piece of paper).  She presented to the ED for further evaluation.   In the ED, her symptoms had already resolved.  She is hypertensive.  Noncontrast head CT and brain MRI were nonacute.  Due to concern for TIA, EDP requested admission for TIA workup.  Admitted by [facility], hospitalist service.    ED Course: Temperature 98.2.  BP 152/76, pulse 66, respiratory 16, saturation 99% on room air.  Lab studies notable for elevated glucose 121, elevated ALT 49.  WBC 12.2, hemoglobin 16.3, neutrophil count 8.3.  TIA/ vs panic attack -Brief transient dysarthria facial numbness all resolved, sinus symptom most consistent with panic attack versus TIA Stroke ruled out, MRI/CT of head were negative -LDL 122, A1c 5.7, -TSH 0.526, 2D echo: To be Reviewed   No changes on telemetry, no focal neurological findings Patient was started on aspirin and Plavix, will continue on aspirin  Home  Crestor 40 mg daily  Bilateral carotid studies 1-49% stenosis bilateral-within normal limits   Frequent palpitations Onset 3 weeks ago frequently, nearing daily Monitor on telemetry  With metoprolol XL,-not tolerating Calcium channel blocker amlodipine  -Advised patient to abstain from alcohol/tobacco/cut down on caffeine   Hypertension BPs are not at goal, elevated. It seems the patient is not tolerating Toprol-XL, switch to amlodipine 5 mg daily   Hyperlipidemia Resume home Crestor Goal LDL 122   Alcohol use Denies any recent use Denies excessive use of alcohol States she uses alcohol about weekly   Tobacco use Nicotine patch as needed -Advised to quit tobacco use soon as possible   QTc prolongation Admission twelve-lead EKG showed QTc 520 Avoid QTc prolonging agents Optimize magnesium and potassium levels       Disposition: Home Diet recommendation:  Discharge Diet Orders (From admission, onward)     Start     Ordered   12/12/22 0000  Diet - low sodium heart healthy        12/12/22 1232           Cardiac diet DISCHARGE MEDICATION: Allergies as of 12/12/2022       Reactions   Penicillins Shortness Of Breath   Childhood reaction. NOTES that she is OK with cephalosporins Childhood reaction. NOTES that she is OK with cephalosporins Childhood reaction. NOTES that she is OK with cephalosporins   Percocet [oxycodone-acetaminophen] Nausea And Vomiting   Cyclobenzaprine Other (See Comments)   Lip tingling Other reaction(s): Other Lip tingling Lip tingling   Doxycycline Rash   Ivp Dye [iodinated Contrast Media] Itching, Other (See Comments)  Itching, sneezing, will need premeds in future.   Levaquin [levofloxacin In D5w] Rash        Medication List     STOP taking these medications    metoprolol succinate 25 MG 24 hr tablet Commonly known as: TOPROL-XL       TAKE these medications    ALPRAZolam 0.5 MG tablet Commonly known as:  XANAX Take 1 tablet (0.5 mg total) by mouth daily as needed.   amLODipine 5 MG tablet Commonly known as: NORVASC Take 1 tablet (5 mg total) by mouth daily.   aspirin EC 81 MG tablet Take 1 tablet (81 mg total) by mouth daily. Swallow whole. Start taking on: December 13, 2022   melatonin 5 MG Tabs Take 1 tablet (5 mg total) by mouth at bedtime as needed.   pantoprazole 40 MG tablet Commonly known as: PROTONIX Take 1 tablet (40 mg total) by mouth daily.   rosuvastatin 40 MG tablet Commonly known as: CRESTOR Take 1 tablet (40 mg total) by mouth daily.        Discharge Exam: Ceasar Mons Weights   12/11/22 1921 12/11/22 2339  Weight: 66.9 kg 65.7 kg        General:  AAO x 3,  cooperative, no distress;   HEENT:  Normocephalic, PERRL, otherwise with in Normal limits   Neuro:  CNII-XII intact. , normal motor and sensation, reflexes intact   Lungs:   Clear to auscultation BL, Respirations unlabored,  No wheezes / crackles  Cardio:    S1/S2, RRR, No murmure, No Rubs or Gallops   Abdomen:  Soft, non-tender, bowel sounds active all four quadrants, no guarding or peritoneal signs.  Muscular  skeletal:  Limited exam -global generalized weaknesses - in bed, able to move all 4 extremities,   2+ pulses,  symmetric, No pitting edema  Skin:  Dry, warm to touch, negative for any Rashes,  Wounds: Please see nursing documentation          Condition at discharge: good  The results of significant diagnostics from this hospitalization (including imaging, microbiology, ancillary and laboratory) are listed below for reference.   Imaging Studies: ECHOCARDIOGRAM COMPLETE  Result Date: 12/12/2022    ECHOCARDIOGRAM REPORT   Patient Name:   Mary Dominguez Date of Exam: 12/12/2022 Medical Rec #:  [id number]        Height:       67.0 in Accession #:    [id number]       Weight:       144.8 lb Date of Birth:  24-May-1965         BSA:          1.763 m Patient Age:    57 years         BP:            122/87 mmHg Patient Gender: F                HR:           82 bpm. [facility] Procedure: 2D Echo, Cardiac Doppler and Color Doppler Indications:    TIA  History:        Patient has no prior history of Echocardiogram examinations.                 TIA, Signs/Symptoms:Chest Pain; Risk Factors:Former Smoker.  Sonographer:    Sheralyn Boatman RDCS Referring Phys: [id number] CAROLE N HALL  Sonographer Comments: Technically difficult study due to poor echo windows.  IMPRESSIONS  1. Left ventricular ejection fraction, by estimation, is 60 to 65%. The left ventricle has normal function. The left ventricle has no regional wall motion abnormalities. Left ventricular diastolic parameters were normal.  2. Right ventricular systolic function is normal. The right ventricular size is normal. Tricuspid regurgitation signal is inadequate for assessing PA pressure.  3. The mitral valve is grossly normal. No evidence of mitral valve regurgitation.  4. The aortic valve was not well visualized. Aortic valve regurgitation is not visualized.  5. The inferior vena cava is normal in size with greater than 50% respiratory variability, suggesting right atrial pressure of 3 mmHg. Comparison(s): No prior Echocardiogram. FINDINGS  Left Ventricle: Left ventricular ejection fraction, by estimation, is 60 to 65%. The left ventricle has normal function. The left ventricle has no regional wall motion abnormalities. The left ventricular internal cavity size was normal in size. There is  no left ventricular hypertrophy. Left ventricular diastolic parameters were normal. Right Ventricle: The right ventricular size is normal. No increase in right ventricular wall thickness. Right ventricular systolic function is normal. Tricuspid regurgitation signal is inadequate for assessing PA pressure. Left Atrium: Left atrial size was normal in size. Right Atrium: Right atrial size was normal in size. Pericardium: There is no evidence of pericardial effusion.  Presence of epicardial fat layer. Mitral Valve: The mitral valve is grossly normal. No evidence of mitral valve regurgitation. Tricuspid Valve: The tricuspid valve is grossly normal. Tricuspid valve regurgitation is trivial. Aortic Valve: The aortic valve was not well visualized. Aortic valve regurgitation is not visualized. Pulmonic Valve: The pulmonic valve was not well visualized. Pulmonic valve regurgitation is trivial. Aorta: The aortic root and ascending aorta are structurally normal, with no evidence of dilitation. Venous: The inferior vena cava is normal in size with greater than 50% respiratory variability, suggesting right atrial pressure of 3 mmHg. IAS/Shunts: No atrial level shunt detected by color flow Doppler.  LEFT VENTRICLE PLAX 2D LVIDd:         4.80 cm     Diastology LVIDs:         3.10 cm     LV e' medial:    8.98 cm/s LV PW:         0.90 cm     LV E/e' medial:  7.8 LV IVS:        0.80 cm     LV e' lateral:   10.10 cm/s LVOT diam:     2.10 cm     LV E/e' lateral: 7.0 LV SV:         66 LV SV Index:   38 LVOT Area:     3.46 cm  LV Volumes (MOD) LV vol d, MOD A2C: 54.8 ml LV vol d, MOD A4C: 42.4 ml LV vol s, MOD A2C: 18.2 ml LV vol s, MOD A4C: 12.6 ml LV SV MOD A2C:     36.6 ml LV SV MOD A4C:     42.4 ml LV SV MOD BP:      32.3 ml RIGHT VENTRICLE             IVC RV S prime:     11.30 cm/s  IVC diam: 1.80 cm TAPSE (M-mode): 2.6 cm LEFT ATRIUM             Index        RIGHT ATRIUM          Index LA diam:        2.30 cm 1.30  cm/m   RA Area:     9.07 cm LA Vol (A2C):   16.3 ml 9.25 ml/m   RA Volume:   16.10 ml 9.13 ml/m LA Vol (A4C):   32.3 ml 18.32 ml/m LA Biplane Vol: 25.4 ml 14.41 ml/m  AORTIC VALVE LVOT Vmax:   104.00 cm/s LVOT Vmean:  67.300 cm/s LVOT VTI:    0.191 m  AORTA Ao Root diam: 3.20 cm Ao Asc diam:  3.30 cm MITRAL VALVE MV Area (PHT): 3.94 cm    SHUNTS MV Decel Time: 193 msec    Systemic VTI:  0.19 m MV E velocity: 70.20 cm/s  Systemic Diam: 2.10 cm MV A velocity: 73.30 cm/s MV E/A  ratio:  0.96 [name] MD Electronically signed by [name] MD Signature Date/Time: 12/12/2022/2:32:17 PM    Final    US Carotid Bilateral  Result Date: 12/12/2022 CLINICAL DATA:  Transient ischemic attack EXAM: BILATERAL CAROTID DUPLEX ULTRASOUND TECHNIQUE: Wallace Cullens scale imaging, color Doppler and duplex ultrasound were performed of bilateral carotid and vertebral arteries in the neck. COMPARISON:  None Available. FINDINGS: Criteria: Quantification of carotid stenosis is based on velocity parameters that correlate the residual internal carotid diameter with NASCET-based stenosis levels, using the diameter of the distal internal carotid lumen as the denominator for stenosis measurement. The following velocity measurements were obtained: RIGHT ICA: 80/30 cm/sec CCA: 89/17 cm/sec SYSTOLIC ICA/CCA RATIO:  1.3 ECA:  108 cm/sec LEFT ICA: 79/31 cm/sec CCA: 86/19 cm/sec SYSTOLIC ICA/CCA RATIO:  0.9 ECA:  143 cm/sec RIGHT CAROTID ARTERY: Mild smooth heterogeneous atherosclerotic plaque in the common carotid artery and proximal internal carotid artery. By peak systolic velocity criteria, the estimated stenosis is less than 50%. RIGHT VERTEBRAL ARTERY:  Patent with normal antegrade flow. LEFT CAROTID ARTERY: Moderate smooth heterogeneous atherosclerotic plaque in the proximal internal carotid artery. By peak systolic velocity criteria, the estimated stenosis is less than 50%. LEFT VERTEBRAL ARTERY:  Patient with normal antegrade flow. IMPRESSION: 1. Mild (1-49%) stenosis proximal right internal carotid artery secondary to mild heterogeneous atherosclerotic plaque. 2. Mild (1-49%) stenosis proximal left internal carotid artery secondary to moderate heterogeneous atherosclerotic plaque. 3. Vertebral arteries are patent with normal antegrade flow. Signed, Sterling Big, MD, RPVI [facility] Radiology Electronically Signed   By: Malachy Moan M.D.   On:  12/12/2022 12:40   DG Chest 2 View  Result Date: 12/11/2022 CLINICAL DATA:  Chest pain. EXAM: CHEST - 2 VIEW COMPARISON:  September 29, 2017 FINDINGS: The heart size and mediastinal contours are within normal limits. There is moderate severity calcification of the aortic arch. Both lungs are clear. The visualized skeletal structures are unremarkable. IMPRESSION: No active cardiopulmonary disease. Electronically Signed   By: Aram Candela M.D.   On: 12/11/2022 22:46   CT Head Wo Contrast  Result Date: 12/11/2022 CLINICAL DATA:  Transient ischemic attack EXAM: CT HEAD WITHOUT CONTRAST TECHNIQUE: Contiguous axial images were obtained from the base of the skull through the vertex without intravenous contrast. RADIATION DOSE REDUCTION: This exam was performed according to the departmental dose-optimization program which includes automated exposure control, adjustment of the mA and/or kV according to patient size and/or use of iterative reconstruction technique. COMPARISON:  03/28/2010 FINDINGS: Brain: No mass,hemorrhage or extra-axial collection. Normal appearance of the parenchyma and CSF spaces. Vascular: No hyperdense vessel or unexpected vascular calcification. Skull: The visualized skull base, calvarium and extracranial soft tissues are normal. Sinuses/Orbits: No fluid levels or advanced mucosal thickening of the visualized  paranasal sinuses. No mastoid or middle ear effusion. Normal orbits. IMPRESSION: Normal head CT. Electronically Signed   By: Deatra Robinson M.D.   On: 12/11/2022 22:18   MR BRAIN WO CONTRAST  Result Date: 12/11/2022 CLINICAL DATA:  Transient ischemic attack EXAM: MRI HEAD WITHOUT CONTRAST TECHNIQUE: Multiplanar, multiecho pulse sequences of the brain and surrounding structures were obtained without intravenous contrast. COMPARISON:  04/25/2010 FINDINGS: Brain: No acute infarct, mass effect or extra-axial collection. No chronic microhemorrhage or siderosis. Normal white matter  signal, parenchymal volume and CSF spaces. The midline structures are normal. Vascular: Normal flow voids. Skull and upper cervical spine: Normal marrow signal. Sinuses/Orbits: Negative. Other: None. IMPRESSION: Normal brain MRI. Electronically Signed   By: Deatra Robinson M.D.   On: 12/11/2022 22:16    Microbiology: No results found for this or any previous visit.  Labs: CBC: Recent Labs  Lab 12/11/22 2019 12/12/22 0424  WBC 12.2* 8.5  NEUTROABS 8.3*  --   HGB 16.3* 16.8*  HCT 47.9* 49.5*  MCV 96.6 96.9  PLT 234 241   Basic Metabolic Panel: Recent Labs  Lab 12/11/22 2019 12/12/22 0424  NA 138 137  K 3.6 3.8  CL 102 103  CO2 28 24  GLUCOSE 121* 155*  BUN 10 10  CREATININE 0.72 0.57  CALCIUM 9.5 9.5  MG  --  2.3  PHOS  --  2.8   Liver Function Tests: Recent Labs  Lab 12/11/22 2019 12/12/22 0424  AST 36 31  ALT 49* 48*  ALKPHOS 65 60  BILITOT 0.4 0.4  PROT 7.6 7.5  ALBUMIN 4.2 4.1   CBG: No results for input(s): "GLUCAP" in the last 168 hours.  Discharge time spent: greater than 30 minutes.  Signed: Kendell Bane, MD [facility] 12/12/2022

## 2022-12-12 NOTE — Evaluation (Signed)
Occupational Therapy Evaluation Patient Details Name: Mary Dominguez MRN: [id number] DOB: 1965-11-12 Today's Date: 12/12/2022   History of Present Illness Mary Dominguez is a 57 y.o. female with medical history significant for chronic anxiety, hypertension, hyperlipidemia, tobacco use, alcohol use, who presents to the ED with multiple complaints which include dizziness, chest pain, intermittent palpitations, tremors, difficulty with speech, slurring of speech and right-sided facial numbness that happened after 6 PM today.  It lasted a few minutes.  Associated with a severe headache and elevated blood pressures in the 180s to 190s systolically at home (which she documented on a piece of paper).  She presented to the ED for further evaluation. (per DO)   Clinical Impression   Pt agreeable to OT and PT co-evaluation. Pt appears to be at or near baseline function. Pt reports independence at baseline. WFL B UE strength and A/ROM. Able to ambulate without assist. Pt is not recommended for further acute OT services and will be discharged to care of nursing staff for remaining length of stay.              Functional Status Assessment  Patient has not had a recent decline in their functional status  Equipment Recommendations  None recommended by OT           Precautions / Restrictions Precautions Precautions: None Restrictions Weight Bearing Restrictions: No      Mobility Bed Mobility Overal bed mobility: Independent                  Transfers Overall transfer level: Independent                        Balance Overall balance assessment: Independent                                         ADL either performed or assessed with clinical judgement   ADL Overall ADL's : Independent                                             Vision Baseline Vision/History: 1 Wears glasses Ability to See in Adequate Light: 1  Impaired Patient Visual Report: No change from baseline Vision Assessment?: No apparent visual deficits     Perception Perception: Not tested       Praxis Praxis: Not tested       Pertinent Vitals/Pain Pain Assessment Pain Assessment: Faces Faces Pain Scale: Hurts a little bit Pain Location: chest pain Pain Descriptors / Indicators: Other (Comment) (no observed signs of pain) Pain Intervention(s): Monitored during session     Extremity/Trunk Assessment Upper Extremity Assessment Upper Extremity Assessment: Overall WFL for tasks assessed   Lower Extremity Assessment Lower Extremity Assessment: Defer to PT evaluation   Cervical / Trunk Assessment Cervical / Trunk Assessment: Normal   Communication Communication Communication: No apparent difficulties   Cognition Arousal: Alert Behavior During Therapy: WFL for tasks assessed/performed Overall Cognitive Status: Within Functional Limits for tasks assessed  Home Living Family/patient expects to be discharged to:: Private residence Living Arrangements: Spouse/significant other Available Help at Discharge: Family;Available 24 hours/day Type of Home: House Home Access: Stairs to enter [employer] of Steps: 1 Entrance Stairs-Rails: None Home Layout: Laundry or work area in basement;One level;Able to live on main level with bedroom/bathroom     Bathroom Shower/Tub: Walk-in shower;Tub/shower unit   Bathroom Toilet: Standard Bathroom Accessibility: Yes How Accessible: Accessible via walker;Accessible via wheelchair Home Equipment: Grab bars - tub/shower          Prior Functioning/Environment Prior Level of Function : Independent/Modified Independent                                                Co-evaluation PT/OT/SLP Co-Evaluation/Treatment: Yes Reason for Co-Treatment: To address functional/ADL transfers    OT goals addressed during session: ADL's and self-care      AM-PAC OT "6 Clicks" Daily Activity     Outcome Measure Help from another person eating meals?: None Help from another person taking care of personal grooming?: None Help from another person toileting, which includes using toliet, bedpan, or urinal?: None Help from another person bathing (including washing, rinsing, drying)?: None Help from another person to put on and taking off regular upper body clothing?: None Help from another person to put on and taking off regular lower body clothing?: None 6 Click Score: 24   End of Session    Activity Tolerance: Patient tolerated treatment well Patient left: in bed;with call bell/phone within reach;with family/visitor present  OT Visit Diagnosis: Other symptoms and signs involving the nervous system (Z30.865)                Time: 7846-9629 OT Time Calculation (min): 16 min Charges:  OT General Charges $OT Visit: 1 Visit OT Evaluation $OT Eval Low Complexity: 1 Low  Jaspreet Hollings OT, MOT  [employer] 12/12/2022, 9:00 AM

## 2022-12-19 ENCOUNTER — Ambulatory Visit: Payer: BC Managed Care – PPO | Admitting: Family Medicine

## 2022-12-24 ENCOUNTER — Ambulatory Visit (INDEPENDENT_AMBULATORY_CARE_PROVIDER_SITE_OTHER): Payer: BC Managed Care – PPO | Admitting: Family Medicine

## 2022-12-24 ENCOUNTER — Encounter: Payer: Self-pay | Admitting: Family Medicine

## 2022-12-24 VITALS — BP 136/99 | HR 86 | Ht 67.0 in | Wt 146.2 lb

## 2022-12-24 DIAGNOSIS — F411 Generalized anxiety disorder: Secondary | ICD-10-CM

## 2022-12-24 DIAGNOSIS — I2510 Atherosclerotic heart disease of native coronary artery without angina pectoris: Secondary | ICD-10-CM | POA: Diagnosis not present

## 2022-12-24 MED ORDER — ESCITALOPRAM OXALATE 10 MG PO TABS: 10.0000 mg | ORAL_TABLET | Freq: Every day | ORAL | 5 refills | Status: AC

## 2022-12-24 MED ORDER — ESTROGENS CONJUGATED 0.625 MG PO TABS: 0.6250 mg | ORAL_TABLET | Freq: Every day | ORAL | 3 refills | Status: DC

## 2022-12-24 MED ORDER — ALPRAZOLAM 0.5 MG PO TABS: 0.5000 mg | ORAL_TABLET | Freq: Two times a day (BID) | ORAL | 0 refills | Status: AC | PRN

## 2022-12-24 NOTE — Progress Notes (Signed)
Subjective:    Patient ID: Mary Dominguez, female    DOB: Mar 30, 1965, 57 y.o.   MRN: [id number]  HPI  Patient discontinued estrogen 45 days ago.  She has a history of smoking.  We discussed the risk of strokes blood clots and breast cancer with estrogen.  However recently she was admitted to the hospital with what was either a panic attack or TIA.  She states that the day of admission, she took Toprol.  Shortly thereafter, she became delirious, she reported feeling discombobulated.  She has no recollection of what happened.  Her husband states that she was "a different person and was not acting herself".  EMS was called.  She was taken to the hospital where an MRI of the brain was clear.  Carotid Doppler showed minimal plaque 1 to 49% bilaterally.  It was felt that she was likely having a panic attack versus a TIA.  Patient took Toprol again in the hospital and a similar reaction happened per the patient's report.  Afterward, her doctor discontinue Toprol and switch her to amlodipine.  When she came home from the hospital she started back on her estrogen and she states that the symptoms went away once she started the estrogen.  We had a long discussion about the increased risk of blood clots and strokes and breast cancer on estrogen especially given her smoking.  She is trying to quit smoking but she begs me to continue the estrogen.  She states that the estrogen is the reason she feels poorly due to hot flashes.  She states that she feels normal when she takes the estrogen and she is willing to accept the risk.  Past Medical History:  Diagnosis Date   Adnexal mass    LEFT   Bronchitis    09-27-17, TOOK ANTIBIOTICS AND PREDNOSONE NOW RESOLVED   Bronchitis    Cancer (HCC)    left forearm skin CA 2022   Former smoker    GERD (gastroesophageal reflux disease)    Hernia, umbilical    Insomnia    Past Surgical History:  Procedure Laterality Date   APPENDECTOMY  AGE 29   COLONOSCOPY WITH PROPOFOL  N/A 09/30/2017   Procedure: COLONOSCOPY WITH PROPOFOL;  Surgeon: West Bali, MD;  [facility];  Service: Endoscopy;  Laterality: N/A;  2:30pm   OOPHORECTOMY     USO 2002   ROBOTIC ASSISTED SALPINGO OOPHERECTOMY Left 10/08/2017   Procedure: XI ROBOTIC ASSISTED UNILATERAL SALPINGO OOPHORECTOMY;  Surgeon: Shonna Chock, MD;  Location: [facility];  Service: Gynecology;  Laterality: Left;   VAGINAL HYSTERECTOMY     dysplasia and AUB   Current Outpatient Medications on File Prior to Visit  Medication Sig Dispense Refill   aspirin EC 81 MG tablet Take 1 tablet (81 mg total) by mouth daily. Swallow whole. 30 tablet 12   pantoprazole (PROTONIX) 40 MG tablet Take 1 tablet (40 mg total) by mouth daily. 30 tablet 3   rosuvastatin (CRESTOR) 40 MG tablet Take 1 tablet (40 mg total) by mouth daily. 90 tablet 3   amLODipine (NORVASC) 5 MG tablet Take 1 tablet (5 mg total) by mouth daily. (Patient not taking: Reported on 12/24/2022) 30 tablet 1   melatonin 5 MG TABS Take 1 tablet (5 mg total) by mouth at bedtime as needed. (Patient not taking: Reported on 12/24/2022) 30 tablet 0   No current facility-administered medications on file prior to visit.   Allergies  Allergen Reactions   Penicillins  Shortness Of Breath    Childhood reaction. NOTES that she is OK with cephalosporins Childhood reaction. NOTES that she is OK with cephalosporins Childhood reaction. NOTES that she is OK with cephalosporins   Percocet [Oxycodone-Acetaminophen] Nausea And Vomiting   Cyclobenzaprine Other (See Comments)    Lip tingling Other reaction(s): Other Lip tingling Lip tingling   Doxycycline Rash   Ivp Dye [Iodinated Contrast Media] Itching and Other (See Comments)    Itching, sneezing, will need premeds in future.   Levaquin [Levofloxacin In D5w] Rash   Metoprolol Anxiety    Pt states she felt faint, dizzy and anxious.    Social History   Socioeconomic History   Marital status: Married    Spouse  name: Not on file   Number of children: Not on file   Years of education: Not on file   Highest education level: Not on file  Occupational History   Not on file  Tobacco Use   Smoking status: Every Day    Current packs/day: 0.75    Average packs/day: 0.8 packs/day for 34.0 years (25.5 ttl pk-yrs)    Types: Cigarettes   Smokeless tobacco: Never  Vaping Use   Vaping status: Never Used  Substance and Sexual Activity   Alcohol use: Yes    Comment: twice a week   Drug use: No   Sexual activity: Yes  Other Topics Concern   Not on file  Social History Narrative   Not on file   Social Drivers of Health   Financial Resource Strain: Not on file  Food Insecurity: No Food Insecurity (12/11/2022)   Hunger Vital Sign    Worried About Running Out of Food in the Last Year: Never true    Ran Out of Food in the Last Year: Never true  Transportation Needs: No Transportation Needs (12/11/2022)   PRAPARE - Administrator, Civil Service (Medical): No    Lack of Transportation (Non-Medical): No  Physical Activity: Not on file  Stress: Not on file  Social Connections: Not on file  Intimate Partner Violence: Not At Risk (12/11/2022)   Humiliation, Afraid, Rape, and Kick questionnaire    Fear of Current or Ex-Partner: No    Emotionally Abused: No    Physically Abused: No    Sexually Abused: No       Review of Systems  All other systems reviewed and are negative.      Objective:   Physical Exam Vitals reviewed.  Constitutional:      General: She is not in acute distress.    Appearance: Normal appearance. She is well-developed and normal weight. She is not ill-appearing, toxic-appearing or diaphoretic.  HENT:     Head: Normocephalic and atraumatic.     Right Ear: Tympanic membrane and ear canal normal.     Left Ear: Tympanic membrane and ear canal normal.     Nose: Nose normal.     Mouth/Throat:     Mouth: Mucous membranes are moist.     Pharynx: Oropharynx is clear.   Eyes:     Extraocular Movements: Extraocular movements intact.     Conjunctiva/sclera: Conjunctivae normal.     Pupils: Pupils are equal, round, and reactive to light.  Neck:     Vascular: No carotid bruit or JVD.  Cardiovascular:     Rate and Rhythm: Normal rate and regular rhythm.     Heart sounds: Normal heart sounds. No murmur heard.    No friction rub. No gallop.  Pulmonary:  Effort: Pulmonary effort is normal. No respiratory distress.     Breath sounds: Normal breath sounds. No stridor. No wheezing, rhonchi or rales.  Abdominal:     General: Bowel sounds are normal. There is no distension.     Palpations: Abdomen is soft. There is no mass.     Tenderness: There is no abdominal tenderness. There is no guarding or rebound.     Hernia: No hernia is present.  Musculoskeletal:        General: Normal range of motion.     Cervical back: Normal range of motion and neck supple. No tenderness.     Right lower leg: No edema.     Left lower leg: No edema.  Lymphadenopathy:     Cervical: No cervical adenopathy.  Neurological:     General: No focal deficit present.     Mental Status: She is alert and oriented to person, place, and time. Mental status is at baseline.     Cranial Nerves: No cranial nerve deficit.     Sensory: No sensory deficit.     Motor: No weakness.     Coordination: Coordination normal.     Gait: Gait normal.     Deep Tendon Reflexes: Reflexes normal.  Psychiatric:        Mood and Affect: Mood normal.        Behavior: Behavior normal.        Thought Content: Thought content normal.        Judgment: Judgment normal.    Assessment & Plan:  GAD (generalized anxiety disorder)  ASCVD (arteriosclerotic cardiovascular disease) I explained to the patient that she has known coronary artery disease with calcification seen in the coronary arteries.  She has a also known carotid artery disease.  Therefore I want to keep her LDL cholesterol well below 70 with a statin.   She is on maximum dose Crestor.  Also her Baycadron 80 mg daily.  Do not believe that the Toprol or the estrogen are the reason she had the event that she had.  I believe that she is having panic attacks.  I explained this to the patient.  I feel that she is under entirely too much stress at work and that she burdens herself excessively.  I recommended setting boundaries with her job and trying to decrease the level of stress in her life.  We will start Lexapro 10 mg a day to help prevent anxiety attacks and better manage her anxiety.  I will temporarily increase her Xanax to twice daily but I would like her to wean back on that as quickly as possible just at night.  I did refill her estrogen due to shared decision making.  I explained to the patient the risk of breast cancer and strokes and blood clots especially with smoking.  She states that she is quitting smoking but she is willing to accept the other risk due to how bad she feels off estrogen.  I recommended that she try to wean herself off of this as quickly as possible.  We will recheck the patient in 3 months or sooner if symptoms arise

## 2023-01-22 ENCOUNTER — Ambulatory Visit: Payer: BC Managed Care – PPO | Admitting: Cardiology

## 2023-02-12 ENCOUNTER — Telehealth: Payer: Self-pay | Admitting: Family Medicine

## 2023-02-12 NOTE — Telephone Encounter (Signed)
 Prescription Request  02/12/2023  LOV: 12/24/2022  What is the name of the medication or equipment?   ALPRAZolam  (XANAX ) 0.5 MG tablet [[id number]]  ENDED    Have you contacted your pharmacy to request a refill? Yes   Which pharmacy would you like this sent to?  [id number] [address], [place] - [address] [place] [address] Phone: [phone] Fax: [phone]    Patient notified that their request is being sent to the clinical staff for review and that they should receive a response within 2 business days.   Please advise pharmacist.

## 2023-02-13 ENCOUNTER — Other Ambulatory Visit: Payer: Self-pay | Admitting: Family Medicine

## 2023-02-13 MED ORDER — ALPRAZOLAM 0.5 MG PO TABS: 0.5000 mg | ORAL_TABLET | Freq: Two times a day (BID) | ORAL | 3 refills | Status: DC | PRN

## 2023-02-18 ENCOUNTER — Other Ambulatory Visit: Payer: BC Managed Care – PPO

## 2023-02-24 ENCOUNTER — Other Ambulatory Visit: Payer: BC Managed Care – PPO

## 2023-02-24 DIAGNOSIS — E7800 Pure hypercholesterolemia, unspecified: Secondary | ICD-10-CM

## 2023-02-25 LAB — LIPID PANEL
Cholesterol: 231 mg/dL — ABNORMAL HIGH (ref <200)
HDL: 46 mg/dL — ABNORMAL LOW (ref >=50)
Non-HDL Cholesterol (Calc): 185 mg/dL — ABNORMAL HIGH (ref <130)
Total CHOL/HDL Ratio: 5 (calc) — ABNORMAL HIGH (ref <5.0)
Triglycerides: 459 mg/dL — ABNORMAL HIGH (ref <150)

## 2023-03-24 ENCOUNTER — Ambulatory Visit: Payer: BC Managed Care – PPO | Admitting: Family Medicine

## 2023-03-24 VITALS — BP 138/86 | HR 75 | Temp 97.8°F | Ht 67.0 in | Wt 148.0 lb

## 2023-03-24 DIAGNOSIS — E7800 Pure hypercholesterolemia, unspecified: Secondary | ICD-10-CM

## 2023-03-24 DIAGNOSIS — K118 Other diseases of salivary glands: Secondary | ICD-10-CM

## 2023-03-24 NOTE — Progress Notes (Signed)
 Subjective:    Patient ID: Mary Dominguez, female    DOB: July 18, 1965, 58 y.o.   MRN: [id number]  Ear Fullness     Patient had a questionable TIA around Christmas time.  She is due to recheck her cholesterol.  We want to keep her LDL cholesterol less than 55.  She is currently on rosuvastatin 40 mg a day.  She is not fasting this morning.  However for the last 2 months she has noticed a mass right behind the angle of the mandible on her right jaw.  It is roughly 2 cm x 1 cm.  It is firm.  It appears to be in the parotid gland.  There is no erythema or warmth or pain.  She is a smoker  Past Medical History:  Diagnosis Date   Adnexal mass    LEFT   Bronchitis    09-27-17, TOOK ANTIBIOTICS AND PREDNOSONE NOW RESOLVED   Bronchitis    Cancer (HCC)    left forearm skin CA 2022   Former smoker    GERD (gastroesophageal reflux disease)    Hernia, umbilical    Insomnia    Past Surgical History:  Procedure Laterality Date   APPENDECTOMY  AGE 60   COLONOSCOPY WITH PROPOFOL N/A 09/30/2017   Procedure: COLONOSCOPY WITH PROPOFOL;  Surgeon: West Bali, MD;  [facility];  Service: Endoscopy;  Laterality: N/A;  2:30pm   OOPHORECTOMY     USO 2002   ROBOTIC ASSISTED SALPINGO OOPHERECTOMY Left 10/08/2017   Procedure: XI ROBOTIC ASSISTED UNILATERAL SALPINGO OOPHORECTOMY;  Surgeon: Shonna Chock, MD;  Location: [facility];  Service: Gynecology;  Laterality: Left;   VAGINAL HYSTERECTOMY     dysplasia and AUB   Current Outpatient Medications on File Prior to Visit  Medication Sig Dispense Refill   ALPRAZolam (XANAX) 0.5 MG tablet Take 1 tablet (0.5 mg total) by mouth 2 (two) times daily as needed for anxiety. 60 tablet 3   aspirin EC 81 MG tablet Take 1 tablet (81 mg total) by mouth daily. Swallow whole. 30 tablet 12   escitalopram (LEXAPRO) 10 MG tablet Take 1 tablet (10 mg total) by mouth daily. 30 tablet 5   estrogens, conjugated, (PREMARIN) 0.625 MG tablet Take 1 tablet (0.625 mg  total) by mouth daily. Pt states she is taking 2 per day, restarted 12/13/2022. 60 tablet 3   melatonin 5 MG TABS Take 1 tablet (5 mg total) by mouth at bedtime as needed. (Patient not taking: Reported on 12/24/2022) 30 tablet 0   pantoprazole (PROTONIX) 40 MG tablet Take 1 tablet (40 mg total) by mouth daily. 30 tablet 3   rosuvastatin (CRESTOR) 40 MG tablet Take 1 tablet (40 mg total) by mouth daily. 90 tablet 3   No current facility-administered medications on file prior to visit.   Allergies  Allergen Reactions   Penicillins Shortness Of Breath    Childhood reaction. NOTES that she is OK with cephalosporins Childhood reaction. NOTES that she is OK with cephalosporins Childhood reaction. NOTES that she is OK with cephalosporins   Percocet [Oxycodone-Acetaminophen] Nausea And Vomiting   Cyclobenzaprine Other (See Comments)    Lip tingling Other reaction(s): Other Lip tingling Lip tingling   Doxycycline Rash   Ivp Dye [Iodinated Contrast Media] Itching and Other (See Comments)    Itching, sneezing, will need premeds in future.   Levaquin [Levofloxacin In D5w] Rash   Metoprolol Anxiety    Pt states she felt faint, dizzy and anxious.  Social History   Socioeconomic History   Marital status: Married    Spouse name: Not on file   Number of children: Not on file   Years of education: Not on file   Highest education level: Not on file  Occupational History   Not on file  Tobacco Use   Smoking status: Every Day    Current packs/day: 0.75    Average packs/day: 0.8 packs/day for 34.0 years (25.5 ttl pk-yrs)    Types: Cigarettes   Smokeless tobacco: Never  Vaping Use   Vaping status: Never Used  Substance and Sexual Activity   Alcohol use: Yes    Comment: twice a week   Drug use: No   Sexual activity: Yes  Other Topics Concern   Not on file  Social History Narrative   Not on file   Social Drivers of Health   Financial Resource Strain: Low Risk  (03/24/2023)   Overall  Financial Resource Strain (CARDIA)    Difficulty of Paying Living Expenses: Not hard at all  Food Insecurity: No Food Insecurity (03/24/2023)   Hunger Vital Sign    Worried About Running Out of Food in the Last Year: Never true    Ran Out of Food in the Last Year: Never true  Transportation Needs: No Transportation Needs (03/24/2023)   PRAPARE - Administrator, Civil Service (Medical): No    Lack of Transportation (Non-Medical): No  Physical Activity: Insufficiently Active (03/24/2023)   Exercise Vital Sign    Days of Exercise per Week: 1 day    Minutes of Exercise per Session: 10 min  Stress: Stress Concern Present (03/24/2023)   [employer] of Occupational Health - Occupational Stress Questionnaire    Feeling of Stress : To some extent  Social Connections: Moderately Isolated (03/24/2023)   Social Connection and Isolation Panel [NHANES]    Frequency of Communication with Friends and Family: Once a week    Frequency of Social Gatherings with Friends and Family: Once a week    Attends Religious Services: 1 to 4 times per year    Active Member of [employer] or Organizations: No    Attends Banker Meetings: Not on file    Marital Status: Married  Catering manager Violence: Not At Risk (12/11/2022)   Humiliation, Afraid, Rape, and Kick questionnaire    Fear of Current or Ex-Partner: No    Emotionally Abused: No    Physically Abused: No    Sexually Abused: No       Review of Systems  All other systems reviewed and are negative.      Objective:   Physical Exam Vitals reviewed.  Constitutional:      General: She is not in acute distress.    Appearance: Normal appearance. She is well-developed and normal weight. She is not ill-appearing, toxic-appearing or diaphoretic.  HENT:     Head: Normocephalic and atraumatic. Mass present.      Nose: Nose normal.     Mouth/Throat:     Mouth: Mucous membranes are moist.     Pharynx: Oropharynx is clear.  Eyes:      Extraocular Movements: Extraocular movements intact.     Conjunctiva/sclera: Conjunctivae normal.     Pupils: Pupils are equal, round, and reactive to light.  Neck:     Vascular: No carotid bruit or JVD.  Cardiovascular:     Rate and Rhythm: Normal rate and regular rhythm.     Heart sounds: Normal heart sounds. No murmur  heard.    No friction rub. No gallop.  Pulmonary:     Effort: Pulmonary effort is normal. No respiratory distress.     Breath sounds: Normal breath sounds. No stridor. No wheezing, rhonchi or rales.  Abdominal:     General: Bowel sounds are normal. There is no distension.     Palpations: Abdomen is soft. There is no mass.     Tenderness: There is no abdominal tenderness. There is no guarding or rebound.     Hernia: No hernia is present.  Musculoskeletal:        General: Normal range of motion.     Cervical back: Normal range of motion and neck supple. No tenderness.     Right lower leg: No edema.     Left lower leg: No edema.  Lymphadenopathy:     Cervical: No cervical adenopathy.  Neurological:     General: No focal deficit present.     Mental Status: She is alert and oriented to person, place, and time. Mental status is at baseline.     Cranial Nerves: No cranial nerve deficit.     Sensory: No sensory deficit.     Motor: No weakness.     Coordination: Coordination normal.     Gait: Gait normal.     Deep Tendon Reflexes: Reflexes normal.  Psychiatric:        Mood and Affect: Mood normal.        Behavior: Behavior normal.        Thought Content: Thought content normal.        Judgment: Judgment normal.    Assessment & Plan:  Mass of right parotid gland - Plan: CT SOFT TISSUE NECK W WO CONTRAST  Pure hypercholesterolemia - Plan: COMPLETE METABOLIC PANEL WITH GFR, Lipid panel I patient has known coronary artery disease and a recent TIA.  Therefore I want her to maintain an LDL cholesterol less than 55.  She can return fasting for a fasting lipid panel and  a CMP.  I will schedule the patient for CT scan of the neck to evaluate the mass at the angle of the mandible.  I believe that it could be a benign parotid gland tumor however I want to rule out malignancy given her history of smoking

## 2023-03-25 ENCOUNTER — Other Ambulatory Visit: Payer: Self-pay | Admitting: Family Medicine

## 2023-03-25 DIAGNOSIS — K118 Other diseases of salivary glands: Secondary | ICD-10-CM

## 2023-04-07 ENCOUNTER — Other Ambulatory Visit

## 2023-04-07 DIAGNOSIS — E7800 Pure hypercholesterolemia, unspecified: Secondary | ICD-10-CM | POA: Diagnosis not present

## 2023-04-08 LAB — LIPID PANEL
Cholesterol: 199 mg/dL (ref <200)
HDL: 42 mg/dL — ABNORMAL LOW (ref >=50)
LDL Cholesterol (Calc): 106 mg/dL — ABNORMAL HIGH
Non-HDL Cholesterol (Calc): 157 mg/dL — ABNORMAL HIGH (ref <130)
Total CHOL/HDL Ratio: 4.7 (calc) (ref <5.0)
Triglycerides: 385 mg/dL — ABNORMAL HIGH (ref <150)

## 2023-04-08 LAB — COMPLETE METABOLIC PANEL WITHOUT GFR
AG Ratio: 1.8 (calc) (ref 1.0–2.5)
ALT: 11 U/L (ref 6–29)
AST: 14 U/L (ref 10–35)
Albumin: 4.2 g/dL (ref 3.6–5.1)
Alkaline phosphatase (APISO): 79 U/L (ref 37–153)
BUN: 7 mg/dL (ref 7–25)
CO2: 29 mmol/L (ref 20–32)
Calcium: 9.4 mg/dL (ref 8.6–10.4)
Chloride: 102 mmol/L (ref 98–110)
Creat: 0.79 mg/dL (ref 0.50–1.03)
Globulin: 2.4 g/dL (ref 1.9–3.7)
Glucose, Bld: 92 mg/dL (ref 65–99)
Potassium: 4.7 mmol/L (ref 3.5–5.3)
Sodium: 139 mmol/L (ref 135–146)
Total Bilirubin: 0.3 mg/dL (ref 0.2–1.2)
Total Protein: 6.6 g/dL (ref 6.1–8.1)

## 2023-04-09 ENCOUNTER — Other Ambulatory Visit: Payer: Self-pay

## 2023-04-09 MED ORDER — OMEGA-3-ACID ETHYL ESTERS 1 G PO CAPS: 1.0000 g | ORAL_CAPSULE | Freq: Two times a day (BID) | ORAL | 0 refills | Status: DC

## 2023-04-09 MED ORDER — EZETIMIBE 10 MG PO TABS
10.0000 mg | ORAL_TABLET | Freq: Every day | ORAL | 3 refills | Status: DC
Start: 2023-04-09 — End: 2023-07-31

## 2023-04-14 ENCOUNTER — Telehealth: Payer: Self-pay

## 2023-04-14 MED ORDER — PREDNISONE 50 MG PO TABS: ORAL_TABLET | ORAL | 0 refills | Status: AC

## 2023-04-14 NOTE — Telephone Encounter (Signed)
 Phone call to patient to review instructions for 13 hr prep for CT w/ contrast on 04/17/23  at 12:15PM. [facility]. Pt aware and verbalized understanding of instructions.  Prescription: Pt to take 50 mg of prednisone on 04/16/23 at 11:15PM, 50 mg of prednisone on 04/17/23 at 5:15AM, and 50 mg of prednisone on 04/17/23 at 11:15AM. Pt is also to take 50 mg of benadryl on 04/17/23 at 11:15AM. Please call [phone] with any questions. on:

## 2023-04-17 ENCOUNTER — Ambulatory Visit
Admission: RE | Admit: 2023-04-17 | Discharge: 2023-04-17 | Disposition: A | Discharge status: Home / Self Care | Source: Ambulatory Visit | Attending: Family Medicine | Admitting: Family Medicine

## 2023-04-17 DIAGNOSIS — R221 Localized swelling, mass and lump, neck: Secondary | ICD-10-CM | POA: Diagnosis not present

## 2023-04-17 DIAGNOSIS — K118 Other diseases of salivary glands: Secondary | ICD-10-CM

## 2023-04-17 MED ORDER — IOPAMIDOL (ISOVUE-370) INJECTION 76%
75.0000 mL | Freq: Once | INTRAVENOUS | Status: AC | PRN
  Administered 2023-04-17: 75 mL via INTRAVENOUS

## 2023-05-04 ENCOUNTER — Other Ambulatory Visit: Payer: Self-pay | Admitting: Family Medicine

## 2023-07-11 ENCOUNTER — Other Ambulatory Visit: Payer: Self-pay | Admitting: Family Medicine

## 2023-07-15 ENCOUNTER — Other Ambulatory Visit: Payer: Self-pay

## 2023-07-15 ENCOUNTER — Other Ambulatory Visit: Payer: Self-pay | Admitting: Family Medicine

## 2023-07-15 ENCOUNTER — Telehealth: Payer: Self-pay | Admitting: Family Medicine

## 2023-07-15 MED ORDER — ROSUVASTATIN CALCIUM 40 MG PO TABS: 40.0000 mg | ORAL_TABLET | Freq: Every day | ORAL | 3 refills | Status: DC

## 2023-07-15 NOTE — Telephone Encounter (Signed)
 Prescription Request  07/15/2023  LOV: 03/24/2023  What is the name of the medication or equipment? rosuvastatin  (CRESTOR ) 40 MG tablet   Have you contacted your pharmacy to request a refill? Yes   Which pharmacy would you like this sent to?  [id number] [address], [place] - [address] [place] [address] Phone: [phone] Fax: [phone]    Patient notified that their request is being sent to the clinical staff for review and that they should receive a response within 2 business days.   Please advise at [facility] [phone]

## 2023-07-18 ENCOUNTER — Other Ambulatory Visit: Payer: Self-pay

## 2023-07-18 MED ORDER — ROSUVASTATIN CALCIUM 40 MG PO TABS: 40.0000 mg | ORAL_TABLET | Freq: Every day | ORAL | 3 refills | Status: AC

## 2023-07-30 ENCOUNTER — Other Ambulatory Visit

## 2023-07-30 DIAGNOSIS — G459 Transient cerebral ischemic attack, unspecified: Secondary | ICD-10-CM | POA: Diagnosis not present

## 2023-07-30 DIAGNOSIS — F17200 Nicotine dependence, unspecified, uncomplicated: Secondary | ICD-10-CM

## 2023-07-30 DIAGNOSIS — E7800 Pure hypercholesterolemia, unspecified: Secondary | ICD-10-CM | POA: Diagnosis not present

## 2023-07-30 DIAGNOSIS — I2510 Atherosclerotic heart disease of native coronary artery without angina pectoris: Secondary | ICD-10-CM | POA: Diagnosis not present

## 2023-07-30 LAB — CBC WITH DIFFERENTIAL/PLATELET
Absolute Lymphocytes: 2946 {cells}/uL (ref 850–3900)
Absolute Monocytes: 801 {cells}/uL (ref 200–950)
Basophils Absolute: 57 {cells}/uL (ref 0–200)
Basophils Relative: 0.4 %
Eosinophils Absolute: 186 {cells}/uL (ref 15–500)
Eosinophils Relative: 1.3 %
HCT: 48.5 % — ABNORMAL HIGH (ref 35.0–45.0)
Hemoglobin: 15.9 g/dL — ABNORMAL HIGH (ref 11.7–15.5)
MCH: 30.8 pg (ref 27.0–33.0)
MCHC: 32.8 g/dL (ref 32.0–36.0)
MCV: 93.8 fL (ref 80.0–100.0)
MPV: 10.3 fL (ref 7.5–12.5)
Monocytes Relative: 5.6 %
Neutro Abs: 10310 {cells}/uL — ABNORMAL HIGH (ref 1500–7800)
Neutrophils Relative %: 72.1 %
Platelets: 310 Thousand/uL (ref 140–400)
RBC: 5.17 Million/uL — ABNORMAL HIGH (ref 3.80–5.10)
RDW: 12 % (ref 11.0–15.0)
Total Lymphocyte: 20.6 %
WBC: 14.3 Thousand/uL — ABNORMAL HIGH (ref 3.8–10.8)

## 2023-07-30 LAB — COMPLETE METABOLIC PANEL WITHOUT GFR
AG Ratio: 1.6 (calc) (ref 1.0–2.5)
ALT: 12 U/L (ref 6–29)
AST: 15 U/L (ref 10–35)
Albumin: 4.2 g/dL (ref 3.6–5.1)
Alkaline phosphatase (APISO): 79 U/L (ref 37–153)
BUN: 8 mg/dL (ref 7–25)
CO2: 29 mmol/L (ref 20–32)
Calcium: 9.4 mg/dL (ref 8.6–10.4)
Chloride: 100 mmol/L (ref 98–110)
Creat: 0.73 mg/dL (ref 0.50–1.03)
Globulin: 2.7 g/dL (ref 1.9–3.7)
Glucose, Bld: 105 mg/dL — ABNORMAL HIGH (ref 65–99)
Potassium: 4.4 mmol/L (ref 3.5–5.3)
Sodium: 137 mmol/L (ref 135–146)
Total Bilirubin: 0.4 mg/dL (ref 0.2–1.2)
Total Protein: 6.9 g/dL (ref 6.1–8.1)

## 2023-07-30 LAB — LIPID PANEL
Cholesterol: 187 mg/dL (ref <200)
HDL: 53 mg/dL (ref >=50)
LDL Cholesterol (Calc): 94 mg/dL
Non-HDL Cholesterol (Calc): 134 mg/dL — ABNORMAL HIGH (ref <130)
Total CHOL/HDL Ratio: 3.5 (calc) (ref <5.0)
Triglycerides: 280 mg/dL — ABNORMAL HIGH (ref <150)

## 2023-07-31 ENCOUNTER — Other Ambulatory Visit (HOSPITAL_COMMUNITY): Payer: Self-pay

## 2023-07-31 ENCOUNTER — Ambulatory Visit: Payer: Self-pay | Admitting: Family Medicine

## 2023-07-31 ENCOUNTER — Other Ambulatory Visit: Payer: Self-pay | Admitting: Family Medicine

## 2023-07-31 ENCOUNTER — Telehealth: Payer: Self-pay | Admitting: Pharmacy Technician

## 2023-07-31 MED ORDER — REPATHA SURECLICK 140 MG/ML ~~LOC~~ SOAJ: 140.0000 mg | SUBCUTANEOUS | 2 refills | Status: AC

## 2023-07-31 NOTE — Telephone Encounter (Signed)
 Prior Authorization form/request asks a question that requires your assistance. Please see the question below and advise accordingly. The PA will not be submitted until the necessary information is received.   What is the ICD 10 code for Repatha ?  Thanks!

## 2023-08-01 ENCOUNTER — Other Ambulatory Visit (HOSPITAL_COMMUNITY): Payer: Self-pay

## 2023-08-01 ENCOUNTER — Encounter: Payer: Self-pay | Admitting: Family Medicine

## 2023-08-01 NOTE — Telephone Encounter (Signed)
 Pharmacy Patient Advocate Encounter   Received notification from CoverMyMeds that prior authorization for Repatha  SureClick 140MG /ML auto-injectors is required/requested.   Insurance verification completed.   The patient is insured through [facility] .   Per test claim: PA required; PA submitted to above mentioned insurance via CoverMyMeds Key/confirmation #/EOC [facility] Status is pending

## 2023-08-13 ENCOUNTER — Other Ambulatory Visit (HOSPITAL_COMMUNITY): Payer: Self-pay

## 2023-08-13 NOTE — Telephone Encounter (Signed)
 Good afternoon I called [facility] and her Repatha  PA is still under review as of 08/13/2023

## 2023-08-15 ENCOUNTER — Other Ambulatory Visit (HOSPITAL_COMMUNITY): Payer: Self-pay

## 2023-08-15 NOTE — Telephone Encounter (Signed)
 Pharmacy Patient Advocate Encounter  Received notification from [facility] that Prior Authorization for Repatha  SureClick 140MG /ML auto-injectors has been DENIED.  Full denial letter will be uploaded to the media tab. See denial reason below.     PA #/Case ID/Reference #: [id number]

## 2023-08-25 ENCOUNTER — Other Ambulatory Visit (HOSPITAL_COMMUNITY): Payer: Self-pay

## 2023-09-17 ENCOUNTER — Telehealth: Payer: Self-pay | Admitting: Family Medicine

## 2023-09-17 ENCOUNTER — Other Ambulatory Visit: Payer: Self-pay

## 2023-09-17 DIAGNOSIS — I2510 Atherosclerotic heart disease of native coronary artery without angina pectoris: Secondary | ICD-10-CM

## 2023-09-17 DIAGNOSIS — E7800 Pure hypercholesterolemia, unspecified: Secondary | ICD-10-CM

## 2023-09-17 MED ORDER — OMEGA-3-ACID ETHYL ESTERS 1 G PO CAPS: 1.0000 | ORAL_CAPSULE | Freq: Two times a day (BID) | ORAL | 0 refills | Status: AC

## 2023-09-17 MED ORDER — ESTROGENS CONJUGATED 0.625 MG PO TABS: 0.6250 mg | ORAL_TABLET | Freq: Every day | ORAL | 0 refills | Status: DC

## 2023-09-17 NOTE — Telephone Encounter (Signed)
 Prescription Request  09/17/2023  LOV: 03/24/2023  What is the name of the medication or equipment?   omega-3 acid ethyl esters (LOVAZA ) 1 g capsule  **90 day script requested**  PREMARIN  0.625 MG tablet   Have you contacted your pharmacy to request a refill? Yes   Which pharmacy would you like this sent to?  [id number] [facility], [place] - [address] [place] [address] Phone: [phone] Fax: [phone]    Patient notified that their request is being sent to the clinical staff for review and that they should receive a response within 2 business days.   Please advise pharmacist.

## 2023-09-23 ENCOUNTER — Other Ambulatory Visit: Payer: Self-pay

## 2023-09-23 DIAGNOSIS — F5101 Primary insomnia: Secondary | ICD-10-CM

## 2023-09-23 DIAGNOSIS — F411 Generalized anxiety disorder: Secondary | ICD-10-CM

## 2023-09-23 DIAGNOSIS — I2510 Atherosclerotic heart disease of native coronary artery without angina pectoris: Secondary | ICD-10-CM

## 2023-09-23 DIAGNOSIS — G459 Transient cerebral ischemic attack, unspecified: Secondary | ICD-10-CM

## 2023-09-23 MED ORDER — ESTROGENS CONJUGATED 0.625 MG PO TABS: 0.6250 mg | ORAL_TABLET | Freq: Every day | ORAL | 0 refills | Status: DC

## 2023-10-01 ENCOUNTER — Telehealth: Payer: Self-pay

## 2023-10-01 ENCOUNTER — Other Ambulatory Visit: Payer: Self-pay

## 2023-10-01 NOTE — Telephone Encounter (Signed)
 Fax received from [facility] for clarification of Premarin  directions. Fax in green folder. Mjp,lpn

## 2023-10-02 ENCOUNTER — Other Ambulatory Visit: Payer: Self-pay | Admitting: Family Medicine

## 2023-10-02 MED ORDER — ESTROGENS CONJUGATED 0.625 MG PO TABS: 0.6250 mg | ORAL_TABLET | Freq: Every day | ORAL | 3 refills | Status: AC

## 2023-10-08 ENCOUNTER — Ambulatory Visit (INDEPENDENT_AMBULATORY_CARE_PROVIDER_SITE_OTHER)

## 2023-10-08 ENCOUNTER — Ambulatory Visit
Admission: RE | Admit: 2023-10-08 | Discharge: 2023-10-08 | Disposition: A | Discharge status: Home / Self Care | Payer: Self-pay | Source: Ambulatory Visit | Attending: Nurse Practitioner | Admitting: Nurse Practitioner

## 2023-10-08 VITALS — BP 145/81 | HR 89 | Temp 97.6°F | Resp 21

## 2023-10-08 DIAGNOSIS — R051 Acute cough: Secondary | ICD-10-CM

## 2023-10-08 DIAGNOSIS — R059 Cough, unspecified: Secondary | ICD-10-CM | POA: Diagnosis not present

## 2023-10-08 DIAGNOSIS — J069 Acute upper respiratory infection, unspecified: Secondary | ICD-10-CM | POA: Diagnosis not present

## 2023-10-08 MED ORDER — BENZONATATE 100 MG PO CAPS: 100.0000 mg | ORAL_CAPSULE | Freq: Three times a day (TID) | ORAL | 0 refills | Status: AC | PRN

## 2023-10-08 NOTE — Discharge Instructions (Signed)
 You have a viral upper respiratory infection.  Symptoms should improve over the next week to 10 days.  If you develop chest pain or shortness of breath, go to the emergency room.  Some things that can make you feel better are: - Increased rest - Increasing fluid with water/sugar free electrolytes - Acetaminophen and ibuprofen as needed for fever/pain - Salt water gargling, chloraseptic spray and throat lozenges - OTC guaifenesin (Mucinex) 600 mg twice daily for congestion - Saline sinus flushes or a neti pot - Humidifying the air -Tessalon Perles every 8 hours as needed for dry cough

## 2023-10-08 NOTE — ED Provider Notes (Signed)
 [facility]    CSN: [id number] Arrival date & time: 10/08/23  1137      History   Chief Complaint Chief Complaint  Patient presents with   Cough    Cough and congestion 7 days. Home Covid and flu test negative. OTC meds not working. - Entered by patient    HPI Mary Dominguez is a 58 y.o. female.   Patient presents today with 9 day history of cough and feeling unwell.  She endorses dry and congested cough, shortness of breath, and occasional wheezing.  Also only has a headache or ear pain when she coughs.  No recent fever, chest pain or tightness, runny or stuffy nose, sore throat, abdominal pain, nausea/vomiting, diarrhea, or change in appetite.  She has been pushing fluids, taking Dayquil and Mucinex for symptoms without much improvement.  Multiple COVID-19 and flu tests at home have been negative.  Patient is a current smoker, not currently on any inhalers.    Past Medical History:  Diagnosis Date   Adnexal mass    LEFT   Atherosclerosis of coronary artery    Bronchitis    09-27-17, TOOK ANTIBIOTICS AND PREDNOSONE NOW RESOLVED   Bronchitis    Cancer (HCC)    left forearm skin CA 2022   Former smoker    GERD (gastroesophageal reflux disease)    Hernia, umbilical    Hyperlipidemia LDL goal <55    Insomnia     Patient Active Problem List   Diagnosis Date Noted   TIA (transient ischemic attack) 12/11/2022   Ganglion cyst of foot 12/18/2020   Adnexal mass 09/25/2017   Encounter for screening colonoscopy 09/25/2017   Smoker 09/01/2013   Chest pain, unspecified 09/01/2013   Insomnia    Former smoker     Past Surgical History:  Procedure Laterality Date   APPENDECTOMY  AGE 44   COLONOSCOPY WITH PROPOFOL  N/A 09/30/2017   Procedure: COLONOSCOPY WITH PROPOFOL ;  Surgeon: Harvey Margo CROME, MD;  [facility];  Service: Endoscopy;  Laterality: N/A;  2:30pm   OOPHORECTOMY     USO 2002   ROBOTIC ASSISTED SALPINGO OOPHERECTOMY Left 10/08/2017    Procedure: XI ROBOTIC ASSISTED UNILATERAL SALPINGO OOPHORECTOMY;  Surgeon: Anitra Freddy NOVAK, MD;  Location: [facility];  Service: Gynecology;  Laterality: Left;   VAGINAL HYSTERECTOMY     dysplasia and AUB    OB History   No obstetric history on file.      Home Medications    Prior to Admission medications   Medication Sig Start Date End Date Taking? Authorizing Provider  benzonatate  (TESSALON ) 100 MG capsule Take 1 capsule (100 mg total) by mouth 3 (three) times daily as needed for cough. Do not take with alcohol or while operating or driving heavy machinery 89/8/74  Yes Chandra Raisin A, NP  ALPRAZolam  (XANAX ) 0.5 MG tablet TAKE 1 TABLET BY MOUTH 2 TIMES DAILY AS NEEDED FOR ANXIETY. 07/17/23   Duanne Butler DASEN, MD  aspirin  EC 81 MG tablet Take 1 tablet (81 mg total) by mouth daily. Swallow whole. 12/13/22   Shahmehdi, Adriana LABOR, MD  escitalopram  (LEXAPRO ) 10 MG tablet Take 1 tablet (10 mg total) by mouth daily. 12/24/22   Duanne Butler DASEN, MD  estrogens , conjugated, (PREMARIN ) 0.625 MG tablet Take 1 tablet (0.625 mg total) by mouth daily. 10/02/23   Duanne Butler DASEN, MD  Evolocumab  (REPATHA  SURECLICK) 140 MG/ML SOAJ Inject 140 mg into the skin every 14 (fourteen) days. 07/31/23   Duanne Butler  T, MD  melatonin 5 MG TABS Take 1 tablet (5 mg total) by mouth at bedtime as needed. Patient not taking: Reported on 12/24/2022 12/12/22   Shahmehdi, Seyed A, MD  omega-3 acid ethyl esters (LOVAZA ) 1 g capsule Take 1 capsule (1 g total) by mouth 2 (two) times daily. 09/17/23   Duanne Butler DASEN, MD  pantoprazole  (PROTONIX ) 40 MG tablet Take 1 tablet (40 mg total) by mouth daily. 11/05/22   Duanne Butler DASEN, MD  predniSONE  (DELTASONE ) 50 MG tablet Pt to take 50 mg of prednisone  on 04/16/23 at 11:15PM, 50 mg of prednisone  on 04/17/23 at 5:15AM, and 50 mg of prednisone  on 04/17/23 at 11:15AM. Pt is also to take 50 mg of benadryl  on 04/17/23 at 11:15AM. Please call [phone] with any questions. 04/14/23    Johann Sieving, MD  rosuvastatin  (CRESTOR ) 40 MG tablet Take 1 tablet (40 mg total) by mouth daily. 07/18/23   Duanne Butler DASEN, MD    Family History Family History  Problem Relation Age of Onset   Cancer Neg Hx        no breast, ovarian, or colon cancer   Colon polyps Neg Hx     Social History Social History   Tobacco Use   Smoking status: Every Day    Current packs/day: 0.75    Average packs/day: 0.8 packs/day for 34.0 years (25.5 ttl pk-yrs)    Types: Cigarettes   Smokeless tobacco: Never  Vaping Use   Vaping status: Never Used  Substance Use Topics   Alcohol use: Yes    Comment: twice a week   Drug use: No     Allergies   Penicillins, Percocet [oxycodone-acetaminophen ], Cyclobenzaprine , Doxycycline, Ivp dye [iodinated contrast media], Levaquin  [levofloxacin  in d5w], and Metoprolol    Review of Systems Review of Systems Per HPI  Physical Exam Triage Vital Signs ED Triage Vitals  Encounter Vitals Group     BP 10/08/23 1158 (!) 145/81     Girls Systolic BP Percentile --      Girls Diastolic BP Percentile --      Boys Systolic BP Percentile --      Boys Diastolic BP Percentile --      Pulse Rate 10/08/23 1158 89     Resp 10/08/23 1158 (!) 21     Temp 10/08/23 1158 97.6 F (36.4 C)     Temp Source 10/08/23 1158 Oral     SpO2 10/08/23 1158 98 %     Weight --      Height --      Head Circumference --      Peak Flow --      Pain Score 10/08/23 1157 0     Pain Loc --      Pain Education --      Exclude from Growth Chart --    No data found.  Updated Vital Signs BP (!) 145/81   Pulse 89   Temp 97.6 F (36.4 C) (Oral)   Resp (!) 21   SpO2 98%   Visual Acuity Right Eye Distance:   Left Eye Distance:   Bilateral Distance:    Right Eye Near:   Left Eye Near:    Bilateral Near:     Physical Exam Vitals and nursing note reviewed.  Constitutional:      General: She is not in acute distress.    Appearance: Normal appearance. She is not  ill-appearing or toxic-appearing.  HENT:     Head: Normocephalic and atraumatic.  Right Ear: Tympanic membrane, ear canal and external ear normal.     Left Ear: Tympanic membrane, ear canal and external ear normal.     Nose: No congestion or rhinorrhea.     Mouth/Throat:     Mouth: Mucous membranes are moist.     Pharynx: Oropharynx is clear. Postnasal drip present. No oropharyngeal exudate or posterior oropharyngeal erythema.  Eyes:     General: No scleral icterus.    Extraocular Movements: Extraocular movements intact.  Cardiovascular:     Rate and Rhythm: Normal rate and regular rhythm.  Pulmonary:     Effort: Pulmonary effort is normal. No respiratory distress.     Breath sounds: Normal breath sounds. No wheezing, rhonchi or rales.  Musculoskeletal:     Cervical back: Normal range of motion and neck supple.  Lymphadenopathy:     Cervical: No cervical adenopathy.  Skin:    General: Skin is warm and dry.     Coloration: Skin is not jaundiced or pale.     Findings: No erythema or rash.  Neurological:     Mental Status: She is alert and oriented to person, place, and time.  Psychiatric:        Behavior: Behavior is cooperative.      UC Treatments / Results  Labs (all labs ordered are listed, but only abnormal results are displayed) Labs Reviewed - No data to display  EKG   Radiology DG Chest 2 View Result Date: 10/08/2023 CLINICAL DATA:  Cough for 2 weeks. EXAM: CHEST - 2 VIEW COMPARISON:  December 11, 2022. FINDINGS: The heart size and mediastinal contours are within normal limits. Both lungs are clear. The visualized skeletal structures are unremarkable. IMPRESSION: No active cardiopulmonary disease. Electronically Signed   By: Lynwood Landy Raddle M.D.   On: 10/08/2023 12:18    Procedures Procedures (including critical care time)  Medications Ordered in UC Medications - No data to display  Initial Impression / Assessment and Plan / UC Course  I have reviewed the  triage vital signs and the nursing notes.  Pertinent labs & imaging results that were available during my care of the patient were reviewed by me and considered in my medical decision making (see chart for details).   Patient is well-appearing, normotensive, afebrile, not tachycardic, is mildly tachypneic, oxygenating well on room air.   1. Acute cough 2. Viral URI with cough Vitals and examination are reassuring today Chest xray is negative for pneumonia COVID-19 and influenza testing deferred today given length of symptoms Supportive care discussed with patient, start Tessalon  perles ER and return precautions discussed   The patient was given the opportunity to ask questions.  All questions answered to their satisfaction.  The patient is in agreement to this plan.   Final Clinical Impressions(s) / UC Diagnoses   Final diagnoses:  Acute cough  Viral URI with cough     Discharge Instructions      You have a viral upper respiratory infection.  Symptoms should improve over the next week to 10 days.  If you develop chest pain or shortness of breath, go to the emergency room.  Some things that can make you feel better are: - Increased rest - Increasing fluid with water /sugar free electrolytes - Acetaminophen  and ibuprofen  as needed for fever/pain - Salt water  gargling, chloraseptic spray and throat lozenges - OTC guaifenesin (Mucinex) 600 mg twice daily for congestion - Saline sinus flushes or a neti pot - Humidifying the air -Tessalon  Perles every 8  hours as needed for dry cough      ED Prescriptions     Medication Sig Dispense Auth. Provider   benzonatate  (TESSALON ) 100 MG capsule Take 1 capsule (100 mg total) by mouth 3 (three) times daily as needed for cough. Do not take with alcohol or while operating or driving heavy machinery 30 capsule Chandra Harlene LABOR, NP      PDMP not reviewed this encounter.   Chandra Harlene LABOR, NP 10/08/23 1255

## 2023-10-08 NOTE — ED Triage Notes (Signed)
 Pt present with c/o cough and sob x two weeks. Pt denies fever. States she had a negative COVID and Flu test at home. She tested two times. Pt states the cough is causing her to loose sleep and states she feels she is choking when she sleeps.

## 2023-11-20 ENCOUNTER — Other Ambulatory Visit: Payer: Self-pay | Admitting: Family Medicine

## 2023-11-20 ENCOUNTER — Encounter: Payer: Self-pay | Admitting: Family Medicine

## 2023-11-20 ENCOUNTER — Ambulatory Visit (INDEPENDENT_AMBULATORY_CARE_PROVIDER_SITE_OTHER): Payer: BC Managed Care – PPO | Admitting: Family Medicine

## 2023-11-20 VITALS — BP 126/62 | HR 89 | Temp 98.0°F | Ht 67.0 in | Wt 155.4 lb

## 2023-11-20 DIAGNOSIS — I2510 Atherosclerotic heart disease of native coronary artery without angina pectoris: Secondary | ICD-10-CM | POA: Diagnosis not present

## 2023-11-20 DIAGNOSIS — Z23 Encounter for immunization: Secondary | ICD-10-CM | POA: Diagnosis not present

## 2023-11-20 DIAGNOSIS — F17200 Nicotine dependence, unspecified, uncomplicated: Secondary | ICD-10-CM

## 2023-11-20 DIAGNOSIS — Z0001 Encounter for general adult medical examination with abnormal findings: Secondary | ICD-10-CM

## 2023-11-20 DIAGNOSIS — E7800 Pure hypercholesterolemia, unspecified: Secondary | ICD-10-CM | POA: Diagnosis not present

## 2023-11-20 DIAGNOSIS — M25512 Pain in left shoulder: Secondary | ICD-10-CM

## 2023-11-20 DIAGNOSIS — Z1211 Encounter for screening for malignant neoplasm of colon: Secondary | ICD-10-CM

## 2023-11-20 DIAGNOSIS — Z122 Encounter for screening for malignant neoplasm of respiratory organs: Secondary | ICD-10-CM

## 2023-11-20 DIAGNOSIS — Z0000 Encounter for general adult medical examination without abnormal findings: Secondary | ICD-10-CM

## 2023-11-20 DIAGNOSIS — G8929 Other chronic pain: Secondary | ICD-10-CM

## 2023-11-20 MED ORDER — MELOXICAM 15 MG PO TABS: 15.0000 mg | ORAL_TABLET | Freq: Every day | ORAL | 2 refills | Status: AC

## 2023-11-20 NOTE — Addendum Note (Signed)
 Addended by: ANGELENA RONAL BRADLEY K on: 11/20/2023 12:51 PM   Modules accepted: Orders

## 2023-11-20 NOTE — Progress Notes (Signed)
 Subjective:    Patient ID: Mary Dominguez, female    DOB: [birth year], 58 y.o.   MRN: [id number]  HPI  Patient is a very pleasant 58 year old Caucasian female who is here today for complete physical exam.  She has known coronary artery disease seen on lung cancer screening CT scan 2 years ago.   Her insurance refused Repatha .  She has a history of statin intolerance.  Because of her history of a hysterectomy, she does not require Pap smear.  She is overdue for a colonoscopy.  Last colonoscopy was in 2019 and showed tubular adenomas.  She is also overdue for lung cancer screening due to smoking.  She is overdue for a mammogram.  She prefers to schedule her own mammogram.  She complains of pain in her left shoulder.  She reports severe pain with abduction greater than 90 degrees.  She has severe pain with empty can maneuver.  She has severe pain with Hawking's maneuver.  She also has pain with internal rotation of the left shoulder.  She has decreased passive range of motion with pain with abduction greater than 90 degrees.  All of this suggest impingement syndrome versus a partial tear in her supraspinatus of the left shoulder.  This has been going on for several months.   Past Medical History:  Diagnosis Date   Adnexal mass    LEFT   Atherosclerosis of coronary artery    Bronchitis    09-27-17, TOOK ANTIBIOTICS AND PREDNOSONE NOW RESOLVED   Bronchitis    Cancer (HCC)    left forearm skin CA 2022   Former smoker    GERD (gastroesophageal reflux disease)    Hernia, umbilical    Hyperlipidemia LDL goal <55    Insomnia    Past Surgical History:  Procedure Laterality Date   APPENDECTOMY  AGE 58   COLONOSCOPY WITH PROPOFOL  N/A 09/30/2017   Procedure: COLONOSCOPY WITH PROPOFOL ;  Surgeon: Harvey Margo CROME, MD;  Location: [facility];  Service: Endoscopy;  Laterality: N/A;  2:30pm   OOPHORECTOMY     USO 2002   ROBOTIC ASSISTED SALPINGO OOPHERECTOMY Left 10/08/2017   Procedure: XI ROBOTIC  ASSISTED UNILATERAL SALPINGO OOPHORECTOMY;  Surgeon: Anitra Freddy NOVAK, MD;  Location: [facility];  Service: Gynecology;  Laterality: Left;   VAGINAL HYSTERECTOMY     dysplasia and AUB   Current Outpatient Medications on File Prior to Visit  Medication Sig Dispense Refill   ALPRAZolam  (XANAX ) 0.5 MG tablet TAKE 1 TABLET BY MOUTH 2 TIMES DAILY AS NEEDED FOR ANXIETY. 60 tablet 3   aspirin  EC 81 MG tablet Take 1 tablet (81 mg total) by mouth daily. Swallow whole. 30 tablet 12   benzonatate  (TESSALON ) 100 MG capsule Take 1 capsule (100 mg total) by mouth 3 (three) times daily as needed for cough. Do not take with alcohol or while operating or driving heavy machinery 30 capsule 0   escitalopram  (LEXAPRO ) 10 MG tablet Take 1 tablet (10 mg total) by mouth daily. 30 tablet 5   estrogens , conjugated, (PREMARIN ) 0.625 MG tablet Take 1 tablet (0.625 mg total) by mouth daily. 90 tablet 3   Evolocumab  (REPATHA  SURECLICK) 140 MG/ML SOAJ Inject 140 mg into the skin every 14 (fourteen) days. 2 mL 2   melatonin 5 MG TABS Take 1 tablet (5 mg total) by mouth at bedtime as needed. (Patient not taking: Reported on 12/24/2022) 30 tablet 0   omega-3 acid ethyl esters (LOVAZA ) 1 g capsule Take 1 capsule (1  g total) by mouth 2 (two) times daily. 180 capsule 0   pantoprazole  (PROTONIX ) 40 MG tablet Take 1 tablet (40 mg total) by mouth daily. 30 tablet 3   predniSONE  (DELTASONE ) 50 MG tablet Pt to take 50 mg of prednisone  on 04/16/23 at 11:15PM, 50 mg of prednisone  on 04/17/23 at 5:15AM, and 50 mg of prednisone  on 04/17/23 at 11:15AM. Pt is also to take 50 mg of benadryl  on 04/17/23 at 11:15AM. Please call [phone] with any questions. 3 tablet 0   rosuvastatin  (CRESTOR ) 40 MG tablet Take 1 tablet (40 mg total) by mouth daily. 90 tablet 3   No current facility-administered medications on file prior to visit.   Allergies  Allergen Reactions   Penicillins Shortness Of Breath    Childhood reaction. NOTES that she is OK with  cephalosporins Childhood reaction. NOTES that she is OK with cephalosporins Childhood reaction. NOTES that she is OK with cephalosporins   Percocet [Oxycodone-Acetaminophen ] Nausea And Vomiting   Cyclobenzaprine  Other (See Comments)    Lip tingling Other reaction(s): Other Lip tingling Lip tingling   Doxycycline Rash   Ivp Dye [Iodinated Contrast Media] Itching and Other (See Comments)    Itching, sneezing, will need premeds in future.   Levaquin  [Levofloxacin  In D5w] Rash   Metoprolol  Anxiety    Pt states she felt faint, dizzy and anxious.    Social History   Socioeconomic History   Marital status: Married    Spouse name: Not on file   Number of children: Not on file   Years of education: Not on file   Highest education level: Not on file  Occupational History   Not on file  Tobacco Use   Smoking status: Every Day    Current packs/day: 0.75    Average packs/day: 0.8 packs/day for 34.0 years (25.5 ttl pk-yrs)    Types: Cigarettes   Smokeless tobacco: Never  Vaping Use   Vaping status: Never Used  Substance and Sexual Activity   Alcohol use: Yes    Comment: twice a week   Drug use: No   Sexual activity: Yes  Other Topics Concern   Not on file  Social History Narrative   Not on file   Social Drivers of Health   Financial Resource Strain: Low Risk  (03/24/2023)   Overall Financial Resource Strain (CARDIA)    Difficulty of Paying Living Expenses: Not hard at all  Food Insecurity: No Food Insecurity (03/24/2023)   Hunger Vital Sign    Worried About Running Out of Food in the Last Year: Never true    Ran Out of Food in the Last Year: Never true  Transportation Needs: No Transportation Needs (03/24/2023)   PRAPARE - Administrator, Civil Service (Medical): No    Lack of Transportation (Non-Medical): No  Physical Activity: Insufficiently Active (03/24/2023)   Exercise Vital Sign    Days of Exercise per Week: 1 day    Minutes of Exercise per Session: 10 min   Stress: Stress Concern Present (03/24/2023)   [employer] of Occupational Health - Occupational Stress Questionnaire    Feeling of Stress : To some extent  Social Connections: Moderately Isolated (03/24/2023)   Social Connection and Isolation Panel    Frequency of Communication with Friends and Family: Once a week    Frequency of Social Gatherings with Friends and Family: Once a week    Attends Religious Services: 1 to 4 times per year    Active Member of Clubs or  Organizations: No    Attends Banker Meetings: Not on file    Marital Status: Married  Intimate Partner Violence: Not At Risk (12/11/2022)   Humiliation, Afraid, Rape, and Kick questionnaire    Fear of Current or Ex-Partner: No    Emotionally Abused: No    Physically Abused: No    Sexually Abused: No       Review of Systems  All other systems reviewed and are negative.      Objective:   Physical Exam Vitals reviewed.  Constitutional:      General: She is not in acute distress.    Appearance: Normal appearance. She is well-developed and normal weight. She is not ill-appearing, toxic-appearing or diaphoretic.  HENT:     Head: Normocephalic and atraumatic.     Right Ear: Tympanic membrane and ear canal normal.     Left Ear: Tympanic membrane and ear canal normal.     Nose: Nose normal.     Mouth/Throat:     Mouth: Mucous membranes are moist.     Pharynx: Oropharynx is clear.  Eyes:     Extraocular Movements: Extraocular movements intact.     Conjunctiva/sclera: Conjunctivae normal.     Pupils: Pupils are equal, round, and reactive to light.  Neck:     Vascular: No carotid bruit or JVD.  Cardiovascular:     Rate and Rhythm: Normal rate and regular rhythm.     Heart sounds: Normal heart sounds. No murmur heard.    No friction rub. No gallop.  Pulmonary:     Effort: Pulmonary effort is normal. No respiratory distress.     Breath sounds: Normal breath sounds. No stridor. No wheezing, rhonchi  or rales.  Abdominal:     General: Bowel sounds are normal. There is no distension.     Palpations: Abdomen is soft. There is no mass.     Tenderness: There is no abdominal tenderness. There is no guarding or rebound.     Hernia: No hernia is present.  Musculoskeletal:     Left shoulder: Tenderness and crepitus present. Decreased range of motion. Decreased strength.     Cervical back: Normal range of motion and neck supple. No tenderness.     Right lower leg: No edema.     Left lower leg: No edema.  Lymphadenopathy:     Cervical: No cervical adenopathy.  Neurological:     General: No focal deficit present.     Mental Status: She is alert and oriented to person, place, and time. Mental status is at baseline.     Cranial Nerves: No cranial nerve deficit.     Sensory: No sensory deficit.     Motor: No weakness.     Coordination: Coordination normal.     Gait: Gait normal.     Deep Tendon Reflexes: Reflexes normal.  Psychiatric:        Mood and Affect: Mood normal.        Behavior: Behavior normal.        Thought Content: Thought content normal.        Judgment: Judgment normal.    Assessment & Plan:  General medical exam  ASCVD (arteriosclerotic cardiovascular disease)  Pure hypercholesterolemia  Smoker - Plan: CT CHEST LUNG CA SCREEN LOW DOSE W/O CM  Colon cancer screening - Plan: Ambulatory referral to Gastroenterology  Screening for lung cancer - Plan: CT CHEST LUNG CA SCREEN LOW DOSE W/O CM  Chronic left shoulder pain First regarding her physical  exam, she is due for a mammogram but she states that she wants to schedule this herself.  She does not require Pap smear.  She is due for lung cancer screening and I will order a CT scan of the lung.  She is also due for colon cancer screening given her history of tubular adenomas.  I will order a GI consult.  She is due for the flu shot, the shingles vaccine, and the pneumonia shot.  She only wants the flu shot today.  She states  that she will come back for the shingles shot and the pneumonia shot.  She will also return fasting for a CBC a CMP and a fasting lipid panel.  Her goal LDL cholesterol is less than 70.  Continue to encourage smoking cessation but the patient is in the precontemplative phase.  With regards to the pain in her left shoulder, I feel that she likely has tendinitis in her supraspinatus with impingement syndrome although I cannot exclude a partial tear.  We will trial meloxicam 15 mg daily and if not improving, consider cortisone injection versus physical therapy versus both.  If not improving after that, I would recommend an MRI.  Blood pressure is acceptable.

## 2023-12-03 ENCOUNTER — Other Ambulatory Visit (INDEPENDENT_AMBULATORY_CARE_PROVIDER_SITE_OTHER)

## 2023-12-03 ENCOUNTER — Ambulatory Visit: Payer: Self-pay | Admitting: Family Medicine

## 2023-12-03 DIAGNOSIS — I2510 Atherosclerotic heart disease of native coronary artery without angina pectoris: Secondary | ICD-10-CM | POA: Diagnosis not present

## 2023-12-03 DIAGNOSIS — F17200 Nicotine dependence, unspecified, uncomplicated: Secondary | ICD-10-CM | POA: Diagnosis not present

## 2023-12-03 DIAGNOSIS — Z23 Encounter for immunization: Secondary | ICD-10-CM | POA: Diagnosis not present

## 2023-12-03 DIAGNOSIS — E7800 Pure hypercholesterolemia, unspecified: Secondary | ICD-10-CM | POA: Diagnosis not present

## 2023-12-03 NOTE — Progress Notes (Signed)
 Patient is in office today for a nurse visit for Immunization. Patient Injection was given in the  Right deltoid. Patient tolerated injection well.

## 2023-12-04 LAB — CBC WITH DIFFERENTIAL/PLATELET
Absolute Lymphocytes: 3556 {cells}/uL (ref 850–3900)
Absolute Monocytes: 813 {cells}/uL (ref 200–950)
Basophils Absolute: 51 {cells}/uL (ref 0–200)
Basophils Relative: 0.4 %
Eosinophils Absolute: 394 {cells}/uL (ref 15–500)
Eosinophils Relative: 3.1 %
HCT: 46.9 % — ABNORMAL HIGH (ref 35.9–46.0)
Hemoglobin: 15.9 g/dL — ABNORMAL HIGH (ref 11.7–15.5)
MCH: 31.5 pg (ref 27.0–33.0)
MCHC: 33.9 g/dL (ref 31.6–35.4)
MCV: 93.1 fL (ref 81.4–101.7)
MPV: 10.6 fL (ref 7.5–12.5)
Monocytes Relative: 6.4 %
Neutro Abs: 7887 {cells}/uL — ABNORMAL HIGH (ref 1500–7800)
Neutrophils Relative %: 62.1 %
Platelets: 280 Thousand/uL (ref 140–400)
RBC: 5.04 Million/uL (ref 3.80–5.10)
RDW: 12.9 % (ref 11.0–15.0)
Total Lymphocyte: 28 %
WBC: 12.7 Thousand/uL — ABNORMAL HIGH (ref 3.8–10.8)

## 2023-12-04 LAB — LIPID PANEL
Cholesterol: 145 mg/dL (ref <200)
HDL: 35 mg/dL — ABNORMAL LOW (ref >=50)
LDL Cholesterol (Calc): 72 mg/dL
Non-HDL Cholesterol (Calc): 110 mg/dL (ref <130)
Total CHOL/HDL Ratio: 4.1 (calc) (ref <5.0)
Triglycerides: 287 mg/dL — ABNORMAL HIGH (ref <150)

## 2023-12-04 LAB — COMPREHENSIVE METABOLIC PANEL WITH GFR
AG Ratio: 2.1 (calc) (ref 1.0–2.5)
ALT: 24 U/L (ref 6–29)
AST: 21 U/L (ref 10–35)
Albumin: 4.5 g/dL (ref 3.6–5.1)
Alkaline phosphatase (APISO): 70 U/L (ref 37–153)
BUN: 11 mg/dL (ref 7–25)
CO2: 31 mmol/L (ref 20–32)
Calcium: 9.5 mg/dL (ref 8.6–10.4)
Chloride: 101 mmol/L (ref 98–110)
Creat: 0.81 mg/dL (ref 0.50–1.03)
Globulin: 2.1 g/dL (ref 1.9–3.7)
Glucose, Bld: 99 mg/dL (ref 65–99)
Potassium: 4.5 mmol/L (ref 3.5–5.3)
Sodium: 140 mmol/L (ref 135–146)
Total Bilirubin: 0.5 mg/dL (ref 0.2–1.2)
Total Protein: 6.6 g/dL (ref 6.1–8.1)
eGFR: 84 mL/min/1.73m2 (ref >=60)

## 2023-12-14 ENCOUNTER — Encounter: Payer: Self-pay | Admitting: Family Medicine

## 2023-12-15 ENCOUNTER — Other Ambulatory Visit: Payer: Self-pay

## 2023-12-17 ENCOUNTER — Telehealth: Payer: Self-pay | Admitting: Family Medicine

## 2023-12-17 NOTE — Telephone Encounter (Signed)
 Copied from CRM #[id number]. Topic: Clinical - Refused Triage >> Dec 17, 2023  8:53 AM Mary Dominguez wrote: Patient/caller voiced complaints of worsening shoulder pain & requesting appointment for cortisone injection. Declined transfer to triage as they already had spoken to office via mychart.   Requesting callback to schedule: [phone]

## 2023-12-18 ENCOUNTER — Encounter: Payer: Self-pay | Admitting: Family Medicine

## 2023-12-24 ENCOUNTER — Inpatient Hospital Stay: Admission: RE | Admit: 2023-12-24 | Discharge: 2023-12-24 | Attending: Family Medicine | Admitting: Family Medicine

## 2023-12-24 DIAGNOSIS — Z122 Encounter for screening for malignant neoplasm of respiratory organs: Secondary | ICD-10-CM

## 2023-12-24 DIAGNOSIS — F17210 Nicotine dependence, cigarettes, uncomplicated: Secondary | ICD-10-CM | POA: Diagnosis not present

## 2023-12-24 DIAGNOSIS — F17200 Nicotine dependence, unspecified, uncomplicated: Secondary | ICD-10-CM

## 2023-12-25 ENCOUNTER — Ambulatory Visit: Admitting: Family Medicine

## 2023-12-25 VITALS — BP 132/78 | HR 90 | Temp 98.0°F | Ht 67.0 in | Wt 154.0 lb

## 2023-12-25 DIAGNOSIS — M25512 Pain in left shoulder: Secondary | ICD-10-CM | POA: Diagnosis not present

## 2023-12-25 MED ORDER — TRIAMCINOLONE ACETONIDE 40 MG/ML IJ SUSP
80.0000 mg | Freq: Once | INTRAMUSCULAR | Status: AC
  Administered 2023-12-25: 12:00:00 80 mg via INTRA_ARTICULAR

## 2023-12-25 NOTE — Progress Notes (Signed)
 Subjective:    Patient ID: Mary Dominguez, female    DOB: 1965/07/19, 58 y.o.   MRN: [id number]  Patient is here today complaining of severe pain in her left shoulder.  She mentioned this to me last time that her physical exam November 13.  We tried meloxicam .  She states the meloxicam  did not help at all.  She reports that she is unable to lift her left arm above 90 degrees.  She has a difficult time putting on her shirt or getting dressed or combing her hair.  She has severe pain with abduction greater than 90 degrees.  Even passive range of motion elicits severe pain in the patient's shoulder.  She winces with gentle palpation around the glenohumeral joint.  She has a positive Hawking sign.  She has pain with empty can testing although her strength is good.  Past Medical History:  Diagnosis Date   Adnexal mass    LEFT   Atherosclerosis of coronary artery    Bronchitis    09-27-17, TOOK ANTIBIOTICS AND PREDNOSONE NOW RESOLVED   Bronchitis    Cancer (HCC)    left forearm skin CA 2022   Former smoker    GERD (gastroesophageal reflux disease)    Hernia, umbilical    Hyperlipidemia LDL goal <55    Insomnia    Past Surgical History:  Procedure Laterality Date   APPENDECTOMY  AGE 62   COLONOSCOPY WITH PROPOFOL  N/A 09/30/2017   Procedure: COLONOSCOPY WITH PROPOFOL ;  Surgeon: Harvey Margo CROME, MD;  Location: [facility];  Service: Endoscopy;  Laterality: N/A;  2:30pm   OOPHORECTOMY     USO 2002   ROBOTIC ASSISTED SALPINGO OOPHERECTOMY Left 10/08/2017   Procedure: XI ROBOTIC ASSISTED UNILATERAL SALPINGO OOPHORECTOMY;  Surgeon: Anitra Freddy NOVAK, MD;  Location: [facility];  Service: Gynecology;  Laterality: Left;   VAGINAL HYSTERECTOMY     dysplasia and AUB   Current Outpatient Medications on File Prior to Visit  Medication Sig Dispense Refill   ALPRAZolam  (XANAX ) 0.5 MG tablet TAKE 1 TABLET BY MOUTH TWICE A DAY AS NEEDED FOR ANXIETY 60 tablet 2   benzonatate  (TESSALON ) 100 MG capsule Take 1  capsule (100 mg total) by mouth 3 (three) times daily as needed for cough. Do not take with alcohol or while operating or driving heavy machinery 30 capsule 0   escitalopram  (LEXAPRO ) 10 MG tablet Take 1 tablet (10 mg total) by mouth daily. 30 tablet 5   estrogens , conjugated, (PREMARIN ) 0.625 MG tablet Take 1 tablet (0.625 mg total) by mouth daily. 90 tablet 3   Evolocumab  (REPATHA  SURECLICK) 140 MG/ML SOAJ Inject 140 mg into the skin every 14 (fourteen) days. 2 mL 2   melatonin 5 MG TABS Take 1 tablet (5 mg total) by mouth at bedtime as needed. 30 tablet 0   omega-3 acid ethyl esters (LOVAZA ) 1 g capsule Take 1 capsule (1 g total) by mouth 2 (two) times daily. 180 capsule 0   pantoprazole  (PROTONIX ) 40 MG tablet Take 1 tablet (40 mg total) by mouth daily. 30 tablet 3   predniSONE  (DELTASONE ) 50 MG tablet Pt to take 50 mg of prednisone  on 04/16/23 at 11:15PM, 50 mg of prednisone  on 04/17/23 at 5:15AM, and 50 mg of prednisone  on 04/17/23 at 11:15AM. Pt is also to take 50 mg of benadryl  on 04/17/23 at 11:15AM. Please call [phone] with any questions. 3 tablet 0   rosuvastatin  (CRESTOR ) 40 MG tablet Take 1 tablet (40 mg total) by  mouth daily. 90 tablet 3   meloxicam  (MOBIC ) 15 MG tablet Take 1 tablet (15 mg total) by mouth daily. (Patient not taking: Reported on 12/25/2023) 30 tablet 2   No current facility-administered medications on file prior to visit.   Allergies  Allergen Reactions   Penicillins Shortness Of Breath    Childhood reaction. NOTES that she is OK with cephalosporins Childhood reaction. NOTES that she is OK with cephalosporins Childhood reaction. NOTES that she is OK with cephalosporins   Percocet [Oxycodone-Acetaminophen ] Nausea And Vomiting   Cyclobenzaprine  Other (See Comments)    Lip tingling Other reaction(s): Other Lip tingling Lip tingling   Doxycycline Rash   Ivp Dye [Iodinated Contrast Media] Itching and Other (See Comments)    Itching, sneezing, will need premeds in  future.   Levaquin  [Levofloxacin  In D5w] Rash   Metoprolol  Anxiety    Pt states she felt faint, dizzy and anxious.    Social History   Socioeconomic History   Marital status: Married    Spouse name: Not on file   Number of children: Not on file   Years of education: Not on file   Highest education level: Not on file  Occupational History   Not on file  Tobacco Use   Smoking status: Every Day    Current packs/day: 0.75    Average packs/day: 0.8 packs/day for 34.0 years (25.5 ttl pk-yrs)    Types: Cigarettes   Smokeless tobacco: Never  Vaping Use   Vaping status: Never Used  Substance and Sexual Activity   Alcohol use: Yes    Comment: twice a week   Drug use: No   Sexual activity: Yes  Other Topics Concern   Not on file  Social History Narrative   Not on file   Social Drivers of Health   Tobacco Use: High Risk (11/20/2023)   Patient History    Smoking Tobacco Use: Every Day    Smokeless Tobacco Use: Never    Passive Exposure: Not on file  Financial Resource Strain: Low Risk (03/24/2023)   Overall Financial Resource Strain (CARDIA)    Difficulty of Paying Living Expenses: Not hard at all  Food Insecurity: No Food Insecurity (03/24/2023)   Hunger Vital Sign    Worried About Running Out of Food in the Last Year: Never true    Ran Out of Food in the Last Year: Never true  Transportation Needs: No Transportation Needs (03/24/2023)   PRAPARE - Administrator, Civil Service (Medical): No    Lack of Transportation (Non-Medical): No  Physical Activity: Insufficiently Active (03/24/2023)   Exercise Vital Sign    Days of Exercise per Week: 1 day    Minutes of Exercise per Session: 10 min  Stress: Stress Concern Present (03/24/2023)   [employer] of Occupational Health - Occupational Stress Questionnaire    Feeling of Stress : To some extent  Social Connections: Moderately Isolated (03/24/2023)   Social Connection and Isolation Panel    Frequency of  Communication with Friends and Family: Once a week    Frequency of Social Gatherings with Friends and Family: Once a week    Attends Religious Services: 1 to 4 times per year    Active Member of [employer] or Organizations: No    Attends Banker Meetings: Not on file    Marital Status: Married  Intimate Partner Violence: Not At Risk (12/11/2022)   Humiliation, Afraid, Rape, and Kick questionnaire    Fear of Current or Ex-Partner: No  Emotionally Abused: No    Physically Abused: No    Sexually Abused: No  Depression (PHQ2-9): Low Risk (12/25/2023)   Depression (PHQ2-9)    PHQ-2 Score: 0  Alcohol Screen: Not on file  Housing: Unknown (03/24/2023)   Housing Stability Vital Sign    Unable to Pay for Housing in the Last Year: No    Number of Times Moved in the Last Year: Not on file    Homeless in the Last Year: No  Utilities: Not At Risk (12/11/2022)   [facility] Utilities    Threatened with loss of utilities: No  Health Literacy: Not on file       Review of Systems  Cardiovascular:  Positive for chest pain.  All other systems reviewed and are negative.      Objective:   Physical Exam Vitals reviewed.  Constitutional:      General: She is not in acute distress.    Appearance: Normal appearance. She is well-developed and normal weight. She is not ill-appearing, toxic-appearing or diaphoretic.  HENT:     Head: Normocephalic and atraumatic.  Neck:     Vascular: No JVD.  Cardiovascular:     Rate and Rhythm: Normal rate and regular rhythm.     Heart sounds: Normal heart sounds. No murmur heard.    No friction rub. No gallop.  Pulmonary:     Effort: Pulmonary effort is normal. No respiratory distress.     Breath sounds: Normal breath sounds. No stridor. No wheezing, rhonchi or rales.  Musculoskeletal:     Left shoulder: Tenderness and bony tenderness present. No swelling, deformity or crepitus. Decreased range of motion. Decreased strength.  Neurological:     Mental  Status: She is alert.  Psychiatric:        Mood and Affect: Mood normal.        Behavior: Behavior normal.        Thought Content: Thought content normal.        Judgment: Judgment normal.    Assessment & Plan:  Acute pain of left shoulder Patient's pain in the left shoulder suggest impingement syndrome versus subacromial bursitis.  Using sterile technique, I injected the left subacromial space with 2 cc of lidocaine , 2 cc of Marcaine , and 2 cc of 40 mg/mL Kenalog .  The patient tolerated the procedure well without complication.  If pain is not improving, she would have tried and failed 6 months of conservative therapy along with a cortisone injection and NSAIDs.  At that point I would recommend an MRI of the shoulder to evaluate further

## 2024-02-06 ENCOUNTER — Ambulatory Visit: Admitting: Family Medicine

## 2024-02-06 DIAGNOSIS — Z23 Encounter for immunization: Secondary | ICD-10-CM | POA: Diagnosis not present

## 2024-02-06 NOTE — Progress Notes (Signed)
 Patient is in office today for a Shingrix  #2 Immunization. Patient Injection was given in the  Right deltoid. Patient tolerated injection well.

## 2024-11-26 ENCOUNTER — Encounter: Admitting: Family Medicine
